# Patient Record
Sex: Female | Born: 1965 | Race: White | Hispanic: No | Smoking: Current every day smoker
Health system: Southern US, Community
[De-identification: ages and names within clinical notes are randomized; demographics above are authoritative.]

## PROBLEM LIST (undated history)

## (undated) DIAGNOSIS — Z720 Tobacco use: Secondary | ICD-10-CM

## (undated) DIAGNOSIS — R251 Tremor, unspecified: Secondary | ICD-10-CM

## (undated) DIAGNOSIS — G629 Polyneuropathy, unspecified: Secondary | ICD-10-CM

## (undated) DIAGNOSIS — J449 Chronic obstructive pulmonary disease, unspecified: Secondary | ICD-10-CM

## (undated) DIAGNOSIS — R51 Headache: Secondary | ICD-10-CM

## (undated) DIAGNOSIS — I10 Essential (primary) hypertension: Secondary | ICD-10-CM

## (undated) DIAGNOSIS — M5136 Other intervertebral disc degeneration, lumbar region: Secondary | ICD-10-CM

## (undated) DIAGNOSIS — M5030 Other cervical disc degeneration, unspecified cervical region: Secondary | ICD-10-CM

## (undated) DIAGNOSIS — J42 Unspecified chronic bronchitis: Secondary | ICD-10-CM

## (undated) DIAGNOSIS — F329 Major depressive disorder, single episode, unspecified: Secondary | ICD-10-CM

## (undated) DIAGNOSIS — E7800 Pure hypercholesterolemia, unspecified: Secondary | ICD-10-CM

## (undated) DIAGNOSIS — Z923 Personal history of irradiation: Secondary | ICD-10-CM

## (undated) DIAGNOSIS — C50919 Malignant neoplasm of unspecified site of unspecified female breast: Secondary | ICD-10-CM

## (undated) DIAGNOSIS — K219 Gastro-esophageal reflux disease without esophagitis: Secondary | ICD-10-CM

## (undated) DIAGNOSIS — M898X9 Other specified disorders of bone, unspecified site: Secondary | ICD-10-CM

## (undated) DIAGNOSIS — F319 Bipolar disorder, unspecified: Secondary | ICD-10-CM

## (undated) DIAGNOSIS — G2581 Restless legs syndrome: Secondary | ICD-10-CM

## (undated) DIAGNOSIS — M51369 Other intervertebral disc degeneration, lumbar region without mention of lumbar back pain or lower extremity pain: Secondary | ICD-10-CM

## (undated) DIAGNOSIS — F32A Depression, unspecified: Secondary | ICD-10-CM

## (undated) DIAGNOSIS — F419 Anxiety disorder, unspecified: Secondary | ICD-10-CM

## (undated) HISTORY — DX: Malignant neoplasm of unspecified site of unspecified female breast: C50.919

## (undated) HISTORY — DX: Bipolar disorder, unspecified: F31.9

## (undated) HISTORY — DX: Other cervical disc degeneration, unspecified cervical region: M50.30

## (undated) HISTORY — DX: Depression, unspecified: F32.A

## (undated) HISTORY — DX: Polyneuropathy, unspecified: G62.9

## (undated) HISTORY — DX: Restless legs syndrome: G25.81

## (undated) HISTORY — DX: Anxiety disorder, unspecified: F41.9

## (undated) HISTORY — DX: Other intervertebral disc degeneration, lumbar region without mention of lumbar back pain or lower extremity pain: M51.369

## (undated) HISTORY — PX: ABDOMINAL HYSTERECTOMY: SHX81

## (undated) HISTORY — PX: ANKLE SURGERY: SHX546

## (undated) HISTORY — DX: Other intervertebral disc degeneration, lumbar region: M51.36

## (undated) HISTORY — DX: Tremor, unspecified: R25.1

---
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
## 1997-10-12 ENCOUNTER — Other Ambulatory Visit: Admission: RE | Admit: 1997-10-12 | Discharge: 1997-10-12 | Payer: Self-pay | Admitting: Gynecology

## 2001-07-26 ENCOUNTER — Emergency Department (HOSPITAL_COMMUNITY): Admission: EM | Admit: 2001-07-26 | Discharge: 2001-07-26 | Payer: Self-pay | Admitting: *Deleted

## 2001-08-06 ENCOUNTER — Emergency Department (HOSPITAL_COMMUNITY): Admission: EM | Admit: 2001-08-06 | Discharge: 2001-08-06 | Payer: Self-pay | Admitting: Emergency Medicine

## 2001-10-20 ENCOUNTER — Emergency Department (HOSPITAL_COMMUNITY): Admission: EM | Admit: 2001-10-20 | Discharge: 2001-10-21 | Payer: Self-pay | Admitting: Emergency Medicine

## 2001-12-11 ENCOUNTER — Emergency Department (HOSPITAL_COMMUNITY): Admission: EM | Admit: 2001-12-11 | Discharge: 2001-12-11 | Payer: Self-pay | Admitting: Internal Medicine

## 2001-12-14 ENCOUNTER — Emergency Department (HOSPITAL_COMMUNITY): Admission: EM | Admit: 2001-12-14 | Discharge: 2001-12-14 | Payer: Self-pay

## 2002-05-18 ENCOUNTER — Emergency Department (HOSPITAL_COMMUNITY): Admission: EM | Admit: 2002-05-18 | Discharge: 2002-05-19 | Payer: Self-pay | Admitting: Emergency Medicine

## 2002-05-21 ENCOUNTER — Emergency Department (HOSPITAL_COMMUNITY): Admission: EM | Admit: 2002-05-21 | Discharge: 2002-05-22 | Payer: Self-pay | Admitting: Emergency Medicine

## 2007-06-05 ENCOUNTER — Emergency Department (HOSPITAL_COMMUNITY): Admission: EM | Admit: 2007-06-05 | Discharge: 2007-06-06 | Payer: Self-pay | Admitting: Emergency Medicine

## 2008-08-27 ENCOUNTER — Emergency Department (HOSPITAL_COMMUNITY): Admission: EM | Admit: 2008-08-27 | Discharge: 2008-08-27 | Payer: Self-pay | Admitting: Emergency Medicine

## 2011-03-12 ENCOUNTER — Emergency Department (HOSPITAL_COMMUNITY): Payer: Self-pay

## 2011-03-12 ENCOUNTER — Inpatient Hospital Stay (HOSPITAL_COMMUNITY)
Admission: EM | Admit: 2011-03-12 | Discharge: 2011-03-14 | DRG: 419 | Disposition: A | Discharge status: Home / Self Care | Payer: Self-pay | Attending: General Surgery | Admitting: General Surgery

## 2011-03-12 ENCOUNTER — Encounter: Payer: Self-pay | Admitting: *Deleted

## 2011-03-12 DIAGNOSIS — K810 Acute cholecystitis: Principal | ICD-10-CM | POA: Diagnosis present

## 2011-03-12 HISTORY — DX: Essential (primary) hypertension: I10

## 2011-03-12 HISTORY — DX: Unspecified chronic bronchitis: J42

## 2011-03-12 HISTORY — DX: Headache: R51

## 2011-03-12 LAB — DIFFERENTIAL
Basophils Absolute: 0 10*3/uL (ref 0.0–0.1)
Basophils Relative: 0 % (ref 0–1)
Lymphocytes Relative: 14 % (ref 12–46)
Monocytes Absolute: 1.2 10*3/uL — ABNORMAL HIGH (ref 0.1–1.0)
Monocytes Relative: 7 % (ref 3–12)
Neutro Abs: 13.4 10*3/uL — ABNORMAL HIGH (ref 1.7–7.7)
Neutrophils Relative %: 79 % — ABNORMAL HIGH (ref 43–77)

## 2011-03-12 LAB — AMYLASE: Amylase: 60 U/L (ref 0–105)

## 2011-03-12 LAB — CBC
HCT: 44.6 % (ref 36.0–46.0)
Hemoglobin: 15.6 g/dL — ABNORMAL HIGH (ref 12.0–15.0)
MCH: 29.9 pg (ref 26.0–34.0)
MCV: 85.6 fL (ref 78.0–100.0)
Platelets: 285 10*3/uL (ref 150–400)
RBC: 5.21 MIL/uL — ABNORMAL HIGH (ref 3.87–5.11)
WBC: 17 10*3/uL — ABNORMAL HIGH (ref 4.0–10.5)

## 2011-03-12 LAB — URINALYSIS, ROUTINE W REFLEX MICROSCOPIC
Bilirubin Urine: NEGATIVE
Nitrite: NEGATIVE
Specific Gravity, Urine: 1.01 (ref 1.005–1.030)
Urobilinogen, UA: 0.2 mg/dL (ref 0.0–1.0)
pH: 6 (ref 5.0–8.0)

## 2011-03-12 LAB — BASIC METABOLIC PANEL
CO2: 24 mEq/L (ref 19–32)
Calcium: 9.7 mg/dL (ref 8.4–10.5)
Chloride: 98 mEq/L (ref 96–112)
Creatinine, Ser: 0.66 mg/dL (ref 0.50–1.10)
Glucose, Bld: 113 mg/dL — ABNORMAL HIGH (ref 70–99)

## 2011-03-12 LAB — RAPID URINE DRUG SCREEN, HOSP PERFORMED
Amphetamines: NOT DETECTED
Benzodiazepines: NOT DETECTED
Cocaine: NOT DETECTED

## 2011-03-12 MED ORDER — SODIUM CHLORIDE 0.9 % IJ SOLN
INTRAMUSCULAR | Status: AC
  Administered 2011-03-12: 16:00:00
  Filled 2011-03-12: qty 10

## 2011-03-12 MED ORDER — ONDANSETRON HCL 4 MG/2ML IJ SOLN
4.0000 mg | Freq: Once | INTRAMUSCULAR | Status: AC
  Administered 2011-03-12: 4 mg via INTRAVENOUS

## 2011-03-12 MED ORDER — SODIUM CHLORIDE 0.9 % IJ SOLN
INTRAMUSCULAR | Status: AC
  Administered 2011-03-12: 10 mL
  Filled 2011-03-12: qty 10

## 2011-03-12 MED ORDER — SODIUM CHLORIDE 0.9 % IV BOLUS (SEPSIS)
500.0000 mL | Freq: Once | INTRAVENOUS | Status: AC
  Administered 2011-03-12: 07:00:00 via INTRAVENOUS

## 2011-03-12 MED ORDER — SODIUM CHLORIDE 0.9 % IV SOLN: Freq: Once | INTRAVENOUS | Status: DC

## 2011-03-12 MED ORDER — METOPROLOL TARTRATE 1 MG/ML IV SOLN
5.0000 mg | Freq: Four times a day (QID) | INTRAVENOUS | Status: DC
  Administered 2011-03-12 – 2011-03-14 (×5): 5 mg via INTRAVENOUS
  Filled 2011-03-12 (×5): qty 5

## 2011-03-12 MED ORDER — LACTATED RINGERS IV SOLN
INTRAVENOUS | Status: DC
  Administered 2011-03-12 – 2011-03-13 (×3): via INTRAVENOUS

## 2011-03-12 MED ORDER — SODIUM CHLORIDE 0.9 % IV SOLN
999.0000 mL | INTRAVENOUS | Status: DC
  Administered 2011-03-12 (×2): 999 mL via INTRAVENOUS

## 2011-03-12 MED ORDER — NICOTINE 21 MG/24HR TD PT24
21.0000 mg | MEDICATED_PATCH | Freq: Every day | TRANSDERMAL | Status: DC
  Administered 2011-03-12 – 2011-03-14 (×3): 21 mg via TRANSDERMAL
  Filled 2011-03-12 (×3): qty 1

## 2011-03-12 MED ORDER — ENOXAPARIN SODIUM 40 MG/0.4ML ~~LOC~~ SOLN
40.0000 mg | Freq: Every day | SUBCUTANEOUS | Status: DC
  Administered 2011-03-12 – 2011-03-14 (×3): 40 mg via SUBCUTANEOUS
  Filled 2011-03-12 (×3): qty 0.4

## 2011-03-12 MED ORDER — ONDANSETRON HCL 4 MG/2ML IJ SOLN
4.0000 mg | Freq: Four times a day (QID) | INTRAMUSCULAR | Status: DC | PRN
  Administered 2011-03-12 – 2011-03-13 (×4): 4 mg via INTRAVENOUS
  Filled 2011-03-12 (×6): qty 2

## 2011-03-12 MED ORDER — ONDANSETRON HCL 4 MG/2ML IJ SOLN
4.0000 mg | Freq: Once | INTRAMUSCULAR | Status: AC
  Administered 2011-03-12: 4 mg via INTRAVENOUS
  Filled 2011-03-12: qty 2

## 2011-03-12 MED ORDER — HYDROMORPHONE HCL 1 MG/ML IJ SOLN: 1.0000 mg | INTRAMUSCULAR | Status: DC | PRN

## 2011-03-12 MED ORDER — HYDROMORPHONE HCL 1 MG/ML IJ SOLN
1.0000 mg | Freq: Once | INTRAMUSCULAR | Status: AC
  Administered 2011-03-12: 1 mg via INTRAVENOUS
  Filled 2011-03-12: qty 1

## 2011-03-12 MED ORDER — SODIUM CHLORIDE 0.9 % IV SOLN: INTRAVENOUS | Status: DC

## 2011-03-12 MED ORDER — ONDANSETRON HCL 4 MG/2ML IJ SOLN: 4.0000 mg | Freq: Three times a day (TID) | INTRAMUSCULAR | Status: DC | PRN

## 2011-03-12 MED ORDER — HYDROMORPHONE HCL 1 MG/ML IJ SOLN
1.0000 mg | INTRAMUSCULAR | Status: DC | PRN
  Administered 2011-03-12 – 2011-03-13 (×4): 1 mg via INTRAVENOUS
  Administered 2011-03-13: 2 mg via INTRAVENOUS
  Filled 2011-03-12: qty 2
  Filled 2011-03-12 (×4): qty 1

## 2011-03-12 MED ORDER — PIPERACILLIN-TAZOBACTAM 3.375 G IVPB
3.3750 g | Freq: Once | INTRAVENOUS | Status: AC
  Administered 2011-03-12: 3.375 g via INTRAVENOUS
  Filled 2011-03-12: qty 50

## 2011-03-12 MED ORDER — SODIUM CHLORIDE 0.9 % IJ SOLN
INTRAMUSCULAR | Status: AC
  Administered 2011-03-12: 16:00:00
  Filled 2011-03-12: qty 3

## 2011-03-12 MED ORDER — PIPERACILLIN-TAZOBACTAM 3.375 G IVPB
3.3750 g | Freq: Three times a day (TID) | INTRAVENOUS | Status: DC
  Administered 2011-03-12 – 2011-03-13 (×3): 3.375 g via INTRAVENOUS
  Filled 2011-03-12 (×7): qty 50

## 2011-03-12 NOTE — ED Provider Notes (Signed)
History     CSN: [id number] Arrival date & time: 03/12/2011  6:21 AM Scribed for Gerhard Munch, MD, the patient was seen in room APA08/APA08. This chart was scribed by Katha Cabal. This patient's care was started at  8:11AM.     Chief Complaint  Patient presents with  . Abdominal Pain   HPI  Elizabeth Bullock is a 45 y.o. female who presents to the Emergency Department complaining of sharp, constant RUQ, RLQ abdominal pain that radiates to right flank.  Pain began all of a sudden yesterday evening with associated nausea and vomiting.  Pt denies headache, chills, fever, diarrhea, vaginal bleeding, SOB, lightheadedness and peripheral edema.  Pain is alleviated by nothing and is aggravated by various factors (none specifically noted by patient).  Pt has hx of headaches and is compliant with prescribed medication.   Pt adds that she had a hysterectomy but still has  her tonsils and appendix.      PAST MEDICAL HISTORY:  Past Medical History  Diagnosis Date  . Hypertension   . Bronchitis, chronic     PAST SURGICAL HISTORY:  Past Surgical History  Procedure Date  . Abdominal hysterectomy     MEDICATIONS:  Previous Medications   AMITRIPTYLINE (ELAVIL) 50 MG TABLET    Take 50 mg by mouth 2 (two) times daily.     IBUPROFEN (ADVIL,MOTRIN) 200 MG TABLET    Take 800 mg by mouth every 6 (six) hours as needed. Pain    MELOXICAM (MOBIC) 15 MG TABLET    Take 15 mg by mouth daily.     PROPRANOLOL (INDERAL) 40 MG TABLET    Take 40 mg by mouth 2 (two) times daily.     SERTRALINE (ZOLOFT) 50 MG TABLET    Take 50 mg by mouth daily.       ALLERGIES:  Allergies as of 03/12/2011 - Review Complete 03/12/2011  Allergen Reaction Noted  . Codeine Hives and Nausea And Vomiting 03/12/2011  . Morphine and related Nausea Only 03/12/2011     FAMILY HISTORY:  History reviewed. No pertinent family history.   SOCIAL HISTORY: History   Social History  . Marital Status: Divorced    Spouse Name: N/A    Number of Children: N/A  . Years of Education: N/A   Social History Main Topics  . Smoking status: Current Everyday Smoker -- 1.0 packs/day    Types: Cigarettes  . Smokeless tobacco: None  . Alcohol Use: No  . Drug Use: No  . Sexually Active:    Other Topics Concern  . None   Social History Narrative  . None    Review of Systems 10 Systems reviewed and are negative for acute change except as noted in the HPI.  Physical Exam  BP 140/103  Pulse 79  Temp(Src) 97.8 F (36.6 C) (Oral)  Resp 20  Ht 5\' 4"  (1.626 m)  Wt 180 lb (81.647 kg)  BMI 30.90 kg/m2  SpO2 99%  Physical Exam  Constitutional: She is oriented to person, place, and time. She appears well-developed and well-nourished. No distress.  HENT:  Head: Normocephalic.  Eyes: Conjunctivae are normal. Pupils are equal, round, and reactive to light.  Neck: Normal range of motion. Neck supple.  Cardiovascular: Normal rate, regular rhythm and normal heart sounds.   No murmur heard. Pulmonary/Chest: Effort normal and breath sounds normal. No respiratory distress. She has no wheezes. She has no rales.  Abdominal: Soft. Bowel sounds are normal. There is tenderness. There is guarding (right )  and CVA tenderness (right ).       RUQ, RLQ tenderness,   Musculoskeletal: Normal range of motion.       Negative test of straight leg test bilaterally.    Neurological: She is alert and oriented to person, place, and time.  Skin: Skin is warm and dry. She is not diaphoretic.  Psychiatric: She has a normal mood and affect. Her behavior is normal.    ED Course  Procedures OTHER DATA REVIEWED: Nursing notes, vital signs, and past medical records reviewed.   DIAGNOSTIC STUDIES: Oxygen Saturation is 99% on room air, normal by my interpretation.     LABS / RADIOLOGY:  Results for orders placed during the hospital encounter of 03/12/11  CBC      Component Value Range   WBC 17.0 (*) 4.0 - 10.5 (K/uL)   RBC 5.21 (*) 3.87 - 5.11  (MIL/uL)   Hemoglobin 15.6 (*) 12.0 - 15.0 (g/dL)   HCT 14.7  82.9 - 56.2 (%)   MCV 85.6  78.0 - 100.0 (fL)   MCH 29.9  26.0 - 34.0 (pg)   MCHC 35.0  30.0 - 36.0 (g/dL)   RDW 13.0  86.5 - 78.4 (%)   Platelets 285  150 - 400 (K/uL)  BASIC METABOLIC PANEL      Component Value Range   Sodium 136  135 - 145 (mEq/L)   Potassium 3.7  3.5 - 5.1 (mEq/L)   Chloride 98  96 - 112 (mEq/L)   CO2 24  19 - 32 (mEq/L)   Glucose, Bld 113 (*) 70 - 99 (mg/dL)   BUN 5 (*) 6 - 23 (mg/dL)   Creatinine, Ser 6.96  0.50 - 1.10 (mg/dL)   Calcium 9.7  8.4 - 29.5 (mg/dL)   GFR calc non Af Amer >60  >60 (mL/min)   GFR calc Af Amer >60  >60 (mL/min)  AMYLASE      Component Value Range   Amylase 60  0 - 105 (U/L)  LIPASE, BLOOD      Component Value Range   Lipase 35  11 - 59 (U/L)  URINALYSIS, ROUTINE W REFLEX MICROSCOPIC      Component Value Range   Color, Urine YELLOW  YELLOW    Appearance CLEAR  CLEAR    Specific Gravity, Urine 1.010  1.005 - 1.030    pH 6.0  5.0 - 8.0    Glucose, UA NEGATIVE  NEGATIVE (mg/dL)   Hgb urine dipstick TRACE (*) NEGATIVE    Bilirubin Urine NEGATIVE  NEGATIVE    Ketones, ur NEGATIVE  NEGATIVE (mg/dL)   Protein, ur NEGATIVE  NEGATIVE (mg/dL)   Urobilinogen, UA 0.2  0.0 - 1.0 (mg/dL)   Nitrite NEGATIVE  NEGATIVE    Leukocytes, UA NEGATIVE  NEGATIVE   DIFFERENTIAL      Component Value Range   Neutrophils Relative 79 (*) 43 - 77 (%)   Neutro Abs 13.4 (*) 1.7 - 7.7 (K/uL)   Lymphocytes Relative 14  12 - 46 (%)   Lymphs Abs 2.3  0.7 - 4.0 (K/uL)   Monocytes Relative 7  3 - 12 (%)   Monocytes Absolute 1.2 (*) 0.1 - 1.0 (K/uL)   Eosinophils Relative 1  0 - 5 (%)   Eosinophils Absolute 0.1  0.0 - 0.7 (K/uL)   Basophils Relative 0  0 - 1 (%)   Basophils Absolute 0.0  0.0 - 0.1 (K/uL)  URINE RAPID DRUG SCREEN (HOSP PERFORMED)      Component  Value Range   Opiates NONE DETECTED  NONE DETECTED    Cocaine NONE DETECTED  NONE DETECTED    Benzodiazepines NONE DETECTED  NONE  DETECTED    Amphetamines NONE DETECTED  NONE DETECTED    Tetrahydrocannabinol POSITIVE (*) NONE DETECTED    Barbiturates NONE DETECTED  NONE DETECTED   URINE MICROSCOPIC-ADD ON      Component Value Range   Squamous Epithelial / LPF FEW (*) RARE    RBC / HPF 0-2  <3 (RBC/hpf)     Ct Abdomen Pelvis Wo Contrast  03/12/2011  *RADIOLOGY REPORT*  Clinical Data: Right flank pain, denies history of stones  CT ABDOMEN AND PELVIS WITHOUT CONTRAST  Technique:  Multidetector CT imaging of the abdomen and pelvis was performed following the standard protocol without intravenous contrast.  Comparison: None.  Findings:  The lack of intravenous contrast on stability to evaluate solid abdominal organs.  Normal hepatic contour.  No discrete fat pelvic lesions on this noncontrast examination.  The gallbladder is distended but otherwise normal without definite evidence of wall thickening or pericholecystic fluid.  No ascites.  There is a punctate (1-2 mm) stone within the right (image 36) and left kidneys (image 37). No evidence of hydronephrosis or ureterectasis.  No stones within the urinary bladder.  No discrete hyper hypoattenuating renal lesions.  There is mild thickening of the bilateral adrenal glands without discrete nodules.  The noncontrast appearance of the pancreas and spleen is normal.  The the bowel is normal in course and caliber without wall thickening or evidence of obstruction. Normal appendix.  No pneumoperitoneum, pneumatosis or portal venous gas.  There is scattered minimal atherosclerotic calcifications within a normal caliber abdominal aorta.  No retroperitoneal, mesenteric, pelvic or inguinal lymphadenopathy.  Limited visualization of the lower thorax demonstrates minimal right basilar subpleural heterogeneous opacities, likely atelectasis.  No focal airspace opacities.  No pleural effusion. Normal heart size.  No pericardial effusion.  Hypoattenuating (8 HU) approximately 3.1 x 1.5 cm left adnexal  lesion may represent am ovarian cyst.  No free fluid within the pelvis.  No discrete right-sided adnexal lesion.  No acute aggressive osseous abnormalities.  Minimal degenerative change of L2 - L3.  IMPRESSION: 1.  Bilateral punctate (1-2 mm) nonobstructing stones without evidence of obstruction.  2.  Hypoattenuating left adnexal lesion may represent an ovarian cyst.  Further evaluation may be performed with pelvic ultrasound as clinically indicated.  Original Report Authenticated By: Waynard Reeds, M.D.   US Abdomen Limited  03/12/2011  *RADIOLOGY REPORT*  Clinical Data:  Right upper quadrant pain  LIMITED ABDOMINAL ULTRASOUND - RIGHT UPPER QUADRANT  Comparison:  03/12/2011 CT  Findings:  Gallbladder:  Numerous echogenic shadowing gallstones noted, greatest diameter 2 cm.  Gallbladder wall appears thickened measuring up to 1.3 cm.  Trace pericholecystic fluid evident with an associated Murphy's sign over the gallbladder during the exam. Findings are compatible with acute calculus cholecystitis.  Common bile duct:  5 mm diameter.  No biliary dilatation or obstruction.  Liver:  No intrahepatic biliary dilatation or focal hepatic abnormality.  Portal vein is patent.  IMPRESSION: Findings compatible with calculus cholecystitis as described.                   Original Report Authenticated By: Judie Petit. Ruel Favors, M.D.    ZO:[id number]:  Gallbladder: Numerous echogenic shadowing gallstones noted, greatest diameter 2 cm. Gallbladder wall appears thickened measuring up to 1.3 cm. Trace pericholecystic fluid evident with an associated Murphy's  sign over the gallbladder during the exam. Findings are compatible with acute calculus cholecystitis.  Common bile duct: 5 mm diameter. No biliary dilatation or obstruction.  Liver: No intrahepatic biliary dilatation or focal hepatic abnormality. Portal vein is patent.      ED COURSE / COORDINATION OF CARE:  Orders Placed This Encounter  Procedures  . CT  Abdomen Pelvis Wo Contrast  . US Abdomen Limited  . CBC  . Basic metabolic panel  . Amylase  . Lipase, blood  . Urinalysis with microscopic  . Differential  . Urine rapid drug screen (hosp performed)  . Urine microscopic-add on  . DIET NPO  . Consult to general surgery  . Bed Request ED to IP    MDM:  45 year old patient presenting with right-sided abdominal pain. The description of the pain is severe, about the right flank and right side of the abdomen, with persistent nausea was concerning for acute abdominal pathology. Initial labs demonstrating a white count of 18,000, and ultrasound with gallbladder wall thickening is consistent with acute cholecystitis. The patient has received IV fluids, Zosyn, and analgesia in the ED. Surgery has been consult in to assist with further management of this patient.    MEDICATIONS GIVEN IN THE E.D. Scheduled Meds:    . sodium chloride   Intravenous STAT  . HYDROmorphone  1 mg Intravenous Once  . ondansetron  4 mg Intravenous Once  . ondansetron  4 mg Intravenous Once  . sodium chloride  500 mL Intravenous Once  . DISCONTD: sodium chloride   Intravenous Once   Continuous Infusions:    . sodium chloride 999 mL (03/12/11 0845)  . piperacillin-tazobactam 3.375 g (03/12/11 1022)      DISCHARGE MEDICATIONS: New Prescriptions   No medications on file     I have seen and evaluated the patient with the assistance of a scribe.  All the documentation has been reviewed, corrected, edited to appropriately reflect my evaluation.Gerhard Munch, MD 03/26/11 [phone]

## 2011-03-12 NOTE — H&P (Signed)
Elizabeth Bullock is an 45 y.o. female.   Chief Complaint: Right upper quadrant abdominal pain. HPI: Patient is a 45 year old he'll presented to the emergency department with less than 24 hours of right upper quadrant abdominal pain. It started relatively acutely. It has been localized to the right upper quadrant with radiation to the right flank and back. It started with him an hour after. She did have fatty greasy foods for dinner last night prior to the episode. She denies any similar symptomatology.  She has had associated nausea. She has had nonbloody emesis. She is in no change in bowel movements with no melena no hematochezia. She denies any fevers or chills. She denies any jaundice. She does have significant family history of biliary disease. She does have been American ancestry. She is at 1 prior pregnancy. Past Medical History  Diagnosis Date  . Hypertension   . Bronchitis, chronic     Past Surgical History  Procedure Date  . Abdominal hysterectomy   . Ankle surgery     left ankle surgery as teenager per pt    History reviewed. No pertinent family history. Social History:  reports that she has been smoking Cigarettes.  She has been smoking about 1 pack per day. She does not have any smokeless tobacco history on file. She reports that she uses illicit drugs (Marijuana and Other-see comments). She reports that she does not drink alcohol.  Allergies:  Allergies  Allergen Reactions  . Codeine Hives and Nausea And Vomiting  . Morphine And Related Nausea Only    Medications Prior to Admission  Medication Dose Route Frequency Provider Last Rate Last Dose  . 0.9 %  sodium chloride infusion  999 mL Intravenous Continuous Gerhard Munch, MD   999 mL at 03/12/11 0845  . 0.9 %  sodium chloride infusion   Intravenous STAT Gerhard Munch, MD      . enoxaparin (LOVENOX) injection 40 mg  40 mg Subcutaneous Q24H Fabio Bering      . HYDROmorphone (DILAUDID) injection 1 mg  1 mg Intravenous  Once Gerhard Munch, MD   1 mg at 03/12/11 0844  . HYDROmorphone (DILAUDID) injection 1-2 mg  1-2 mg Intravenous Q4H PRN Fabio Bering      . lactated ringers infusion   Intravenous Continuous Fabio Bering      . ondansetron (ZOFRAN) injection 4 mg  4 mg Intravenous Once [employer]. Colon Branch, MD   4 mg at 03/12/11 0658  . ondansetron (ZOFRAN) injection 4 mg  4 mg Intravenous Once Gerhard Munch, MD   4 mg at 03/12/11 0844  . ondansetron (ZOFRAN) injection 4 mg  4 mg Intravenous Q8H PRN Gerhard Munch, MD      . ondansetron [facility]) injection 4 mg  4 mg Intravenous Q6H PRN Fabio Bering      . piperacillin-tazobactam (ZOSYN) IVPB 3.375 g  3.375 g Intravenous Once Gerhard Munch, MD   3.375 g at 03/12/11 1022  . piperacillin-tazobactam (ZOSYN) IVPB 3.375 g  3.375 g Intravenous Q8H Fabio Bering      . sodium chloride 0.9 % bolus 500 mL  500 mL Intravenous Once [employer]. Colon Branch, MD      . DISCONTD: 0.9 %  sodium chloride infusion   Intravenous Once Nicoletta Dress. Colon Branch, MD      . DISCONTD: HYDROmorphone (DILAUDID) injection 1 mg  1 mg Intravenous Q4H PRN Gerhard Munch, MD       No current outpatient prescriptions on file as  of 03/12/2011.    Results for orders placed during the hospital encounter of 03/12/11 (from the past 48 hour(s))  CBC     Status: Abnormal   Collection Time   03/12/11  6:54 AM      Component Value Range Comment   WBC 17.0 (*) 4.0 - 10.5 (K/uL)    RBC 5.21 (*) 3.87 - 5.11 (MIL/uL)    Hemoglobin 15.6 (*) 12.0 - 15.0 (g/dL)    HCT 16.1  09.6 - 04.5 (%)    MCV 85.6  78.0 - 100.0 (fL)    MCH 29.9  26.0 - 34.0 (pg)    MCHC 35.0  30.0 - 36.0 (g/dL)    RDW 40.9  81.1 - 91.4 (%)    Platelets 285  150 - 400 (K/uL)   BASIC METABOLIC PANEL     Status: Abnormal   Collection Time   03/12/11  6:54 AM      Component Value Range Comment   Sodium 136  135 - 145 (mEq/L)    Potassium 3.7  3.5 - 5.1 (mEq/L)    Chloride 98  96 - 112 (mEq/L)    CO2 24  19 - 32 (mEq/L)    Glucose,  Bld 113 (*) 70 - 99 (mg/dL)    BUN 5 (*) 6 - 23 (mg/dL)    Creatinine, Ser 7.82  0.50 - 1.10 (mg/dL)    Calcium 9.7  8.4 - 10.5 (mg/dL)    GFR calc non Af Amer >60  >60 (mL/min)    GFR calc Af Amer >60  >60 (mL/min)   AMYLASE     Status: Normal   Collection Time   03/12/11  6:54 AM      Component Value Range Comment   Amylase 60  0 - 105 (U/L)   LIPASE, BLOOD     Status: Normal   Collection Time   03/12/11  6:54 AM      Component Value Range Comment   Lipase 35  11 - 59 (U/L)   DIFFERENTIAL     Status: Abnormal   Collection Time   03/12/11  6:54 AM      Component Value Range Comment   Neutrophils Relative 79 (*) 43 - 77 (%)    Neutro Abs 13.4 (*) 1.7 - 7.7 (K/uL)    Lymphocytes Relative 14  12 - 46 (%)    Lymphs Abs 2.3  0.7 - 4.0 (K/uL)    Monocytes Relative 7  3 - 12 (%)    Monocytes Absolute 1.2 (*) 0.1 - 1.0 (K/uL)    Eosinophils Relative 1  0 - 5 (%)    Eosinophils Absolute 0.1  0.0 - 0.7 (K/uL)    Basophils Relative 0  0 - 1 (%)    Basophils Absolute 0.0  0.0 - 0.1 (K/uL)   URINALYSIS, ROUTINE W REFLEX MICROSCOPIC     Status: Abnormal   Collection Time   03/12/11  7:41 AM      Component Value Range Comment   Color, Urine YELLOW  YELLOW     Appearance CLEAR  CLEAR     Specific Gravity, Urine 1.010  1.005 - 1.030     pH 6.0  5.0 - 8.0     Glucose, UA NEGATIVE  NEGATIVE (mg/dL)    Hgb urine dipstick TRACE (*) NEGATIVE     Bilirubin Urine NEGATIVE  NEGATIVE     Ketones, ur NEGATIVE  NEGATIVE (mg/dL)    Protein, ur NEGATIVE  NEGATIVE (mg/dL)  Urobilinogen, UA 0.2  0.0 - 1.0 (mg/dL)    Nitrite NEGATIVE  NEGATIVE     Leukocytes, UA NEGATIVE  NEGATIVE    URINE RAPID DRUG SCREEN (HOSP PERFORMED)     Status: Abnormal   Collection Time   03/12/11  7:41 AM      Component Value Range Comment   Opiates NONE DETECTED  NONE DETECTED     Cocaine NONE DETECTED  NONE DETECTED     Benzodiazepines NONE DETECTED  NONE DETECTED     Amphetamines NONE DETECTED  NONE DETECTED      Tetrahydrocannabinol POSITIVE (*) NONE DETECTED     Barbiturates NONE DETECTED  NONE DETECTED    URINE MICROSCOPIC-ADD ON     Status: Abnormal   Collection Time   03/12/11  7:41 AM      Component Value Range Comment   Squamous Epithelial / LPF FEW (*) RARE     RBC / HPF 0-2  <3 (RBC/hpf)    Ct Abdomen Pelvis Wo Contrast  03/12/2011  *RADIOLOGY REPORT*  Clinical Data: Right flank pain, denies history of stones  CT ABDOMEN AND PELVIS WITHOUT CONTRAST  Technique:  Multidetector CT imaging of the abdomen and pelvis was performed following the standard protocol without intravenous contrast.  Comparison: None.  Findings:  The lack of intravenous contrast on stability to evaluate solid abdominal organs.  Normal hepatic contour.  No discrete fat pelvic lesions on this noncontrast examination.  The gallbladder is distended but otherwise normal without definite evidence of wall thickening or pericholecystic fluid.  No ascites.  There is a punctate (1-2 mm) stone within the right (image 36) and left kidneys (image 37). No evidence of hydronephrosis or ureterectasis.  No stones within the urinary bladder.  No discrete hyper hypoattenuating renal lesions.  There is mild thickening of the bilateral adrenal glands without discrete nodules.  The noncontrast appearance of the pancreas and spleen is normal.  The the bowel is normal in course and caliber without wall thickening or evidence of obstruction. Normal appendix.  No pneumoperitoneum, pneumatosis or portal venous gas.  There is scattered minimal atherosclerotic calcifications within a normal caliber abdominal aorta.  No retroperitoneal, mesenteric, pelvic or inguinal lymphadenopathy.  Limited visualization of the lower thorax demonstrates minimal right basilar subpleural heterogeneous opacities, likely atelectasis.  No focal airspace opacities.  No pleural effusion. Normal heart size.  No pericardial effusion.  Hypoattenuating (8 HU) approximately 3.1 x 1.5 cm left  adnexal lesion may represent am ovarian cyst.  No free fluid within the pelvis.  No discrete right-sided adnexal lesion.  No acute aggressive osseous abnormalities.  Minimal degenerative change of L2 - L3.  IMPRESSION: 1.  Bilateral punctate (1-2 mm) nonobstructing stones without evidence of obstruction.  2.  Hypoattenuating left adnexal lesion may represent an ovarian cyst.  Further evaluation may be performed with pelvic ultrasound as clinically indicated.  Original Report Authenticated By: Waynard Reeds, M.D.   US Abdomen Limited  03/12/2011  *RADIOLOGY REPORT*  Clinical Data:  Right upper quadrant pain  LIMITED ABDOMINAL ULTRASOUND - RIGHT UPPER QUADRANT  Comparison:  03/12/2011 CT  Findings:  Gallbladder:  Numerous echogenic shadowing gallstones noted, greatest diameter 2 cm.  Gallbladder wall appears thickened measuring up to 1.3 cm.  Trace pericholecystic fluid evident with an associated Murphy's sign over the gallbladder during the exam. Findings are compatible with acute calculus cholecystitis.  Common bile duct:  5 mm diameter.  No biliary dilatation or obstruction.  Liver:  No  intrahepatic biliary dilatation or focal hepatic abnormality.  Portal vein is patent.  IMPRESSION: Findings compatible with calculus cholecystitis as described.                   Original Report Authenticated By: Judie Petit. Ruel Favors, M.D.    Review of Systems  Constitutional: Positive for fever and chills.  HENT: Negative.   Eyes: Negative.   Respiratory: Negative.   Cardiovascular: Negative.   Gastrointestinal: Positive for heartburn, nausea, vomiting and abdominal pain (right upper quadrant). Negative for blood in stool and melena.  Genitourinary: Negative.   Musculoskeletal: Negative.   Skin: Negative.   Neurological: Negative.   Endo/Heme/Allergies: Negative.   Psychiatric/Behavioral: Negative.     Blood pressure 158/95, pulse 77, temperature 97.4 F (36.3 C), temperature source Oral, resp. rate 20, height 5'  4" (1.626 m), weight 79.4 kg (175 lb 0.7 oz), SpO2 95.00%. Physical Exam  Constitutional: She is oriented to person, place, and time. She appears well-developed and well-nourished.       Not comfortable in appearance  HENT:  Head: Normocephalic and atraumatic.  Eyes: Conjunctivae and EOM are normal. Pupils are equal, round, and reactive to light. No scleral icterus.  Neck: Normal range of motion. Neck supple. No tracheal deviation present. No thyromegaly present.  Cardiovascular: Normal rate, regular rhythm and normal heart sounds.   Respiratory: Effort normal and breath sounds normal. No respiratory distress. She has no wheezes.  GI: Soft. She exhibits no distension and no mass. There is tenderness (moderate-to-severe right upper quadrant abdominal pain. Positive Murphy's sign. No diffuse peritoneal signs.). There is no rebound and no guarding.  Musculoskeletal: She exhibits no edema and no tenderness.  Lymphadenopathy:    She has no cervical adenopathy.  Neurological: She is alert and oriented to person, place, and time.  Skin: Skin is warm and dry.     Assessment/Plan  acute cholecystitis. At this point we'll continue bowel rest. Continue IV fluid hydration and resuscitate patient. Continue IV antibiotics DVT prophylaxis and pain control.  Risks, benefits, and alternatives to surgical intervention were discussed at length with the patient. We'll continue conservative management for now but plan to proceed to the operating room in the next 24-48 hours once the patient is resuscitated. She understands and agrees with plan. Zeph Riebel C 03/12/2011, 12:54 PM

## 2011-03-12 NOTE — ED Notes (Signed)
Pt resting quietly with father at her side.  Father of said pt is "annoyed at length of time for surgeon to come see his daughter".  Father of pt is tearful and anxious of pt's outcome. Reassured family pt was in good hands and surgeon would be in as soon as possible.

## 2011-03-12 NOTE — ED Notes (Signed)
Reports pain in right upper quadrant, radiating to back.  Reports nausea and vomiting as well.  Began apx 12 hours ago.  Took ibuprofen about 7pm.

## 2011-03-13 ENCOUNTER — Encounter (HOSPITAL_COMMUNITY)
Admission: EM | Disposition: A | Discharge status: Home / Self Care | Payer: Self-pay | Source: Home / Self Care | Attending: General Surgery

## 2011-03-13 ENCOUNTER — Encounter (HOSPITAL_COMMUNITY): Payer: Self-pay | Admitting: *Deleted

## 2011-03-13 ENCOUNTER — Other Ambulatory Visit: Payer: Self-pay | Admitting: General Surgery

## 2011-03-13 ENCOUNTER — Encounter (HOSPITAL_COMMUNITY): Payer: Self-pay | Admitting: Anesthesiology

## 2011-03-13 ENCOUNTER — Inpatient Hospital Stay (HOSPITAL_COMMUNITY): Payer: Self-pay | Admitting: Anesthesiology

## 2011-03-13 HISTORY — PX: CHOLECYSTECTOMY: SHX55

## 2011-03-13 LAB — CBC
HCT: 40.2 % (ref 36.0–46.0)
Hemoglobin: 13.6 g/dL (ref 12.0–15.0)
MCH: 29.3 pg (ref 26.0–34.0)
MCHC: 33.8 g/dL (ref 30.0–36.0)
MCV: 86.6 fL (ref 78.0–100.0)
Platelets: 225 10*3/uL (ref 150–400)
RBC: 4.64 MIL/uL (ref 3.87–5.11)
RDW: 12.9 % (ref 11.5–15.5)
WBC: 11.3 10*3/uL — ABNORMAL HIGH (ref 4.0–10.5)

## 2011-03-13 LAB — COMPREHENSIVE METABOLIC PANEL
ALT: 62 U/L — ABNORMAL HIGH (ref 0–35)
AST: 47 U/L — ABNORMAL HIGH (ref 0–37)
Albumin: 3.1 g/dL — ABNORMAL LOW (ref 3.5–5.2)
Alkaline Phosphatase: 135 U/L — ABNORMAL HIGH (ref 39–117)
BUN: 7 mg/dL (ref 6–23)
CO2: 29 mEq/L (ref 19–32)
Calcium: 9.3 mg/dL (ref 8.4–10.5)
Chloride: 97 mEq/L (ref 96–112)
Creatinine, Ser: 0.63 mg/dL (ref 0.50–1.10)
GFR calc Af Amer: >60 mL/min (ref >60)
GFR calc non Af Amer: >60 mL/min (ref >60)
Glucose, Bld: 116 mg/dL — ABNORMAL HIGH (ref 70–99)
Potassium: 3.8 mEq/L (ref 3.5–5.1)
Sodium: 135 mEq/L (ref 135–145)
Total Bilirubin: 0.6 mg/dL (ref 0.3–1.2)
Total Protein: 5.9 g/dL — ABNORMAL LOW (ref 6.0–8.3)

## 2011-03-13 SURGERY — LAPAROSCOPIC CHOLECYSTECTOMY
Anesthesia: General | Site: Abdomen | Wound class: Clean Contaminated

## 2011-03-13 MED ORDER — ROCURONIUM BROMIDE 100 MG/10ML IV SOLN
INTRAVENOUS | Status: DC | PRN
  Administered 2011-03-13: 25 mg via INTRAVENOUS

## 2011-03-13 MED ORDER — FENTANYL CITRATE 0.05 MG/ML IJ SOLN: 25.0000 ug | INTRAMUSCULAR | Status: DC | PRN

## 2011-03-13 MED ORDER — FENTANYL CITRATE 0.05 MG/ML IJ SOLN
INTRAMUSCULAR | Status: AC
  Filled 2011-03-13: qty 5

## 2011-03-13 MED ORDER — MIDAZOLAM HCL 2 MG/2ML IJ SOLN
INTRAMUSCULAR | Status: AC
  Filled 2011-03-13: qty 2

## 2011-03-13 MED ORDER — CELECOXIB 200 MG PO CAPS
200.0000 mg | ORAL_CAPSULE | Freq: Two times a day (BID) | ORAL | Status: DC
  Administered 2011-03-13 – 2011-03-14 (×3): 200 mg via ORAL
  Filled 2011-03-13 (×9): qty 2

## 2011-03-13 MED ORDER — CEFAZOLIN SODIUM 1-5 GM-% IV SOLN
INTRAVENOUS | Status: DC | PRN
  Administered 2011-03-13: 1 g via INTRAVENOUS

## 2011-03-13 MED ORDER — BUPIVACAINE HCL (PF) 0.5 % IJ SOLN
INTRAMUSCULAR | Status: AC
  Filled 2011-03-13: qty 30

## 2011-03-13 MED ORDER — CEFAZOLIN SODIUM 1-5 GM-% IV SOLN
INTRAVENOUS | Status: AC
  Filled 2011-03-13: qty 50

## 2011-03-13 MED ORDER — MIDAZOLAM HCL 2 MG/2ML IJ SOLN
1.0000 mg | INTRAMUSCULAR | Status: DC | PRN
  Administered 2011-03-13: 2 mg via INTRAVENOUS

## 2011-03-13 MED ORDER — CELECOXIB 200 MG PO CAPS
200.0000 mg | ORAL_CAPSULE | Freq: Two times a day (BID) | ORAL | Status: DC
  Filled 2011-03-13 (×3): qty 1

## 2011-03-13 MED ORDER — BUPIVACAINE HCL (PF) 0.5 % IJ SOLN
INTRAMUSCULAR | Status: DC | PRN
  Administered 2011-03-13: 10 mL

## 2011-03-13 MED ORDER — SODIUM CHLORIDE 0.9 % IJ SOLN
INTRAMUSCULAR | Status: AC
  Administered 2011-03-13: 10 mL
  Filled 2011-03-13: qty 10

## 2011-03-13 MED ORDER — ROCURONIUM BROMIDE 50 MG/5ML IV SOLN
INTRAVENOUS | Status: AC
  Filled 2011-03-13: qty 1

## 2011-03-13 MED ORDER — PROPOFOL 10 MG/ML IV EMUL
INTRAVENOUS | Status: AC
  Filled 2011-03-13: qty 20

## 2011-03-13 MED ORDER — FENTANYL CITRATE 0.05 MG/ML IJ SOLN
INTRAMUSCULAR | Status: DC | PRN
  Administered 2011-03-13 (×2): 50 ug via INTRAVENOUS
  Administered 2011-03-13: 100 ug via INTRAVENOUS
  Administered 2011-03-13: 150 ug via INTRAVENOUS

## 2011-03-13 MED ORDER — FENTANYL CITRATE 0.05 MG/ML IJ SOLN
INTRAMUSCULAR | Status: AC
  Filled 2011-03-13: qty 2

## 2011-03-13 MED ORDER — ONDANSETRON HCL 4 MG/2ML IJ SOLN
INTRAMUSCULAR | Status: AC
  Filled 2011-03-13: qty 2

## 2011-03-13 MED ORDER — HYDROCODONE-ACETAMINOPHEN 5-325 MG PO TABS
1.0000 | ORAL_TABLET | ORAL | Status: DC | PRN
  Administered 2011-03-13: 2 via ORAL
  Administered 2011-03-14: 1 via ORAL
  Filled 2011-03-13: qty 2
  Filled 2011-03-13: qty 1

## 2011-03-13 MED ORDER — AMITRIPTYLINE HCL 25 MG PO TABS
50.0000 mg | ORAL_TABLET | Freq: Two times a day (BID) | ORAL | Status: DC
  Administered 2011-03-14: 50 mg via ORAL
  Filled 2011-03-13: qty 2

## 2011-03-13 MED ORDER — LIDOCAINE HCL (PF) 1 % IJ SOLN
INTRAMUSCULAR | Status: AC
  Filled 2011-03-13: qty 5

## 2011-03-13 MED ORDER — SODIUM CHLORIDE 0.9 % IJ SOLN
INTRAMUSCULAR | Status: AC
  Filled 2011-03-13: qty 10

## 2011-03-13 MED ORDER — GLYCOPYRROLATE 0.2 MG/ML IJ SOLN
INTRAMUSCULAR | Status: AC
  Filled 2011-03-13: qty 1

## 2011-03-13 MED ORDER — ONDANSETRON HCL 4 MG/2ML IJ SOLN: 4.0000 mg | Freq: Once | INTRAMUSCULAR | Status: AC | PRN

## 2011-03-13 MED ORDER — MIDAZOLAM HCL 5 MG/5ML IJ SOLN
INTRAMUSCULAR | Status: DC | PRN
  Administered 2011-03-13: 2 mg via INTRAVENOUS

## 2011-03-13 MED ORDER — HEMOSTATIC AGENTS (NO CHARGE) OPTIME
TOPICAL | Status: DC | PRN
  Administered 2011-03-13: 1 via TOPICAL

## 2011-03-13 MED ORDER — LABETALOL HCL 5 MG/ML IV SOLN
INTRAVENOUS | Status: AC
  Filled 2011-03-13: qty 4

## 2011-03-13 MED ORDER — GLYCOPYRROLATE 0.2 MG/ML IJ SOLN
0.2000 mg | Freq: Once | INTRAMUSCULAR | Status: AC
  Administered 2011-03-13: 0.2 mg via INTRAVENOUS

## 2011-03-13 MED ORDER — GLYCOPYRROLATE 0.2 MG/ML IJ SOLN
INTRAMUSCULAR | Status: DC | PRN
  Administered 2011-03-13: .4 mg via INTRAVENOUS

## 2011-03-13 MED ORDER — LIDOCAINE HCL 1 % IJ SOLN
INTRAMUSCULAR | Status: DC | PRN
  Administered 2011-03-13: 50 mg via INTRADERMAL

## 2011-03-13 MED ORDER — SERTRALINE HCL 50 MG PO TABS
50.0000 mg | ORAL_TABLET | Freq: Every day | ORAL | Status: DC
  Administered 2011-03-13 – 2011-03-14 (×2): 50 mg via ORAL
  Filled 2011-03-13 (×2): qty 1

## 2011-03-13 MED ORDER — SUCCINYLCHOLINE CHLORIDE 20 MG/ML IJ SOLN
INTRAMUSCULAR | Status: DC | PRN
  Administered 2011-03-13: 140 mg via INTRAVENOUS

## 2011-03-13 MED ORDER — PROMETHAZINE HCL 25 MG/ML IJ SOLN
25.0000 mg | INTRAMUSCULAR | Status: DC | PRN
  Administered 2011-03-13 (×2): 25 mg via INTRAVENOUS
  Filled 2011-03-13 (×2): qty 1

## 2011-03-13 MED ORDER — ONDANSETRON HCL 4 MG/2ML IJ SOLN
4.0000 mg | Freq: Once | INTRAMUSCULAR | Status: AC
  Administered 2011-03-13: 4 mg via INTRAVENOUS

## 2011-03-13 MED ORDER — LACTATED RINGERS IV SOLN
INTRAVENOUS | Status: DC
  Administered 2011-03-13: 150 mL via INTRAVENOUS
  Administered 2011-03-13 (×2): via INTRAVENOUS

## 2011-03-13 MED ORDER — SODIUM CHLORIDE 0.9 % IR SOLN
Status: DC | PRN
  Administered 2011-03-13: 1000 mL
  Administered 2011-03-13: 3000 mL

## 2011-03-13 MED ORDER — LABETALOL HCL 5 MG/ML IV SOLN
INTRAVENOUS | Status: DC | PRN
  Administered 2011-03-13 (×2): 5 mg via INTRAVENOUS

## 2011-03-13 MED ORDER — SODIUM CHLORIDE 0.9 % IV SOLN
INTRAVENOUS | Status: AC
  Administered 2011-03-13: 75 mL via INTRAVENOUS

## 2011-03-13 MED ORDER — PROPOFOL 10 MG/ML IV EMUL
INTRAVENOUS | Status: DC | PRN
  Administered 2011-03-13: 160 mg via INTRAVENOUS

## 2011-03-13 MED ORDER — NEOSTIGMINE METHYLSULFATE 1 MG/ML IJ SOLN
INTRAMUSCULAR | Status: DC | PRN
  Administered 2011-03-13: 3 mg via INTRAMUSCULAR

## 2011-03-13 MED ORDER — CEFAZOLIN SODIUM 1-5 GM-% IV SOLN
1.0000 g | INTRAVENOUS | Status: DC
  Filled 2011-03-13: qty 50

## 2011-03-13 SURGICAL SUPPLY — 37 items
APPLIER CLIP UNV 5X34 EPIX (ENDOMECHANICALS) ×2 IMPLANT
BAG HAMPER (MISCELLANEOUS) ×2 IMPLANT
BENZOIN TINCTURE PRP APPL 2/3 (GAUZE/BANDAGES/DRESSINGS) ×2 IMPLANT
CLOSURE STERI STRIP 1/2 X4 (GAUZE/BANDAGES/DRESSINGS) ×2 IMPLANT
CLOTH BEACON ORANGE TIMEOUT ST (SAFETY) ×2 IMPLANT
COVER LIGHT HANDLE STERIS (MISCELLANEOUS) ×4 IMPLANT
DECANTER SPIKE VIAL GLASS SM (MISCELLANEOUS) ×2 IMPLANT
DEVICE TROCAR PUNCTURE CLOSURE (ENDOMECHANICALS) ×2 IMPLANT
DURAPREP 26ML APPLICATOR (WOUND CARE) ×2 IMPLANT
ELECT REM PT RETURN 9FT ADLT (ELECTROSURGICAL) ×2
ELECTRODE REM PT RTRN 9FT ADLT (ELECTROSURGICAL) ×1 IMPLANT
FILTER SMOKE EVAC LAPAROSHD (FILTER) ×2 IMPLANT
FORMALIN 10 PREFIL 120ML (MISCELLANEOUS) ×2 IMPLANT
GLOVE BIOGEL PI IND STRL 7.5 (GLOVE) ×1 IMPLANT
GLOVE BIOGEL PI INDICATOR 7.5 (GLOVE) ×1
GLOVE ECLIPSE 7.0 STRL STRAW (GLOVE) ×4 IMPLANT
GLOVE INDICATOR 7.0 STRL GRN (GLOVE) ×4 IMPLANT
GLOVE SS BIOGEL STRL SZ 6.5 (GLOVE) ×2 IMPLANT
GLOVE SUPERSENSE BIOGEL SZ 6.5 (GLOVE) ×2
GOWN BRE IMP SLV AUR XL STRL (GOWN DISPOSABLE) ×6 IMPLANT
HEMOSTAT SNOW SURGICEL 2X4 (HEMOSTASIS) ×2 IMPLANT
INST SET LAPROSCOPIC AP (KITS) ×2 IMPLANT
IV NS IRRIG 3000ML ARTHROMATIC (IV SOLUTION) ×4 IMPLANT
KIT ROOM TURNOVER APOR (KITS) ×2 IMPLANT
KIT TROCAR LAP CHOLE (TROCAR) ×2 IMPLANT
MANIFOLD NEPTUNE II (INSTRUMENTS) ×2 IMPLANT
PACK LAP CHOLE LZT030E (CUSTOM PROCEDURE TRAY) ×2 IMPLANT
PAD ARMBOARD 7.5X6 YLW CONV (MISCELLANEOUS) ×2 IMPLANT
POUCH SPECIMEN RETRIEVAL 10MM (ENDOMECHANICALS) ×2 IMPLANT
SET BASIN LINEN APH (SET/KITS/TRAYS/PACK) ×2 IMPLANT
SET TUBE IRRIG SUCTION NO TIP (IRRIGATION / IRRIGATOR) ×2 IMPLANT
SLEEVE Z-THREAD 5X100MM (TROCAR) ×2 IMPLANT
STRIP CLOSURE SKIN 1/2X4 (GAUZE/BANDAGES/DRESSINGS) ×2 IMPLANT
SUT MNCRL AB 4-0 PS2 18 (SUTURE) ×4 IMPLANT
SUT VIC AB 2-0 CT2 27 (SUTURE) ×4 IMPLANT
SYR 50ML LL SCALE MARK (SYRINGE) ×2 IMPLANT
WARMER LAPAROSCOPE (MISCELLANEOUS) ×2 IMPLANT

## 2011-03-13 NOTE — Anesthesia Postprocedure Evaluation (Signed)
  Anesthesia Post-op Note  Patient: Elizabeth Bullock  Procedure(s) Performed:  LAPAROSCOPIC CHOLECYSTECTOMY  Patient Location: PACU  Anesthesia Type: General  Level of Consciousness: awake, oriented and patient cooperative  Airway and Oxygen Therapy: Patient Spontanous Breathing and Patient connected to face mask oxygen  Post-op Pain: 8 /10, moderate  Post-op Assessment: Post-op Vital signs reviewed, Patient's Cardiovascular Status Stable, Respiratory Function Stable, Patent Airway and No signs of Nausea or vomiting  Post-op Vital Signs: Reviewed and stable  Complications: No apparent anesthesia complications

## 2011-03-13 NOTE — Anesthesia Preprocedure Evaluation (Addendum)
Anesthesia Evaluation  Name, MR# and DOB Patient awake  General Assessment Comment  Reviewed: Allergy & Precautions, H&P , NPO status , Patient's Chart, lab work & pertinent test results, reviewed documented beta blocker date and time   History of Anesthesia Complications Negative for: history of anesthetic complications  Airway Mallampati: I  Neck ROM: Full    Dental  (+) Edentulous Upper, Partial Lower, Poor Dentition, Chipped, Missing and Dental Advisory Given   Pulmonary  COPD (smoker 1 PPD)    pulmonary exam normalPulmonary Exam Normal     Cardiovascular hypertensionRegular Normal    Neuro/Psych   Headaches (takes inderal for migraines, not for BP)  (+) Depression,    GI/Hepatic/Renal   Endo/Other    Abdominal   Musculoskeletal   Hematology   Peds  Reproductive/Obstetrics    Anesthesia Other Findings             Anesthesia Physical Anesthesia Plan  ASA: II  Anesthesia Plan: General   Post-op Pain Management:    Induction: Intravenous, Rapid sequence and Cricoid pressure planned  Airway Management Planned: Oral ETT  Additional Equipment:   Intra-op Plan:   Post-operative Plan: Extubation in OR  Informed Consent: I have reviewed the patients History and Physical, chart, labs and discussed the procedure including the risks, benefits and alternatives for the proposed anesthesia with the patient or authorized representative who has indicated his/her understanding and acceptance.   Dental advisory given  Plan Discussed with:   Anesthesia Plan Comments:         Anesthesia Quick Evaluation

## 2011-03-13 NOTE — Transfer of Care (Signed)
Immediate Anesthesia Transfer of Care Note  Patient: Elizabeth Bullock  Procedure(s) Performed:  LAPAROSCOPIC CHOLECYSTECTOMY  Patient Location: PACU  Anesthesia Type: General  Level of Consciousness: awake and patient cooperative  Airway & Oxygen Therapy: Patient Spontanous Breathing and Patient connected to face mask oxygen  Post-op Assessment: Report given to PACU RN, Post -op Vital signs reviewed and stable and Patient moving all extremities  Post vital signs: Reviewed and stable  Complications: No apparent anesthesia complications

## 2011-03-13 NOTE — Progress Notes (Signed)
Day of Surgery  Subjective: Pain improved since yesterday but still present. No nausea or vomiting. She is having some difficulty voiding with some hesitancy and interrupted stream. She does feel that she is not completely emptying when she voids. This is been a new problem she has not experienced in the past.  Objective: Vital signs in last 24 hours: Temp:  [97.4 F (36.3 C)-98.2 F (36.8 C)] 97.9 F (36.6 C) (09/13 1120) Pulse Rate:  [77-95] 95  (09/13 1120) Resp:  [18-20] 20  (09/13 1120) BP: (105-158)/(71-95) 113/80 mmHg (09/13 1120) SpO2:  [92 %-98 %] 98 % (09/13 1120) Weight:  [79.4 kg (175 lb 0.7 oz)] 175 lb 0.7 oz (79.4 kg) (09/12 1228) Last BM Date: 03/11/11  Intake/Output from previous day: 09/12 0701 - 09/13 0700 In: 1649.6 [I.V.:1598.6; IV Piggyback:50] Out: 1 [Emesis/NG output:1] Intake/Output this shift:    General appearance: alert and no distress Resp: clear to auscultation bilaterally Cardio: regular rate and rhythm GI: Soft, moderately obese, moderate to severe right upper quadrant abdominal pain with continued Murphy's sign. No diffuse peritoneal signs. No masses or hernias are apparent.  Lab Results:  @LABLAST2 (wbc:2,hgb:2,hct:2,plt:2) BMET  Basename 03/13/11 0428 03/12/11 0654  NA 135 136  K 3.8 3.7  CL 97 98  CO2 29 24  GLUCOSE 116* 113*  BUN 7 5*  CREATININE 0.63 0.66  CALCIUM 9.3 9.7   PT/INR No results found for this basename: LABPROT:2,INR:2 in the last 72 hours ABG No results found for this basename: PHART:2,PCO2:2,PO2:2,HCO3:2 in the last 72 hours  Studies/Results: Ct Abdomen Pelvis Wo Contrast  03/12/2011  *RADIOLOGY REPORT*  Clinical Data: Right flank pain, denies history of stones  CT ABDOMEN AND PELVIS WITHOUT CONTRAST  Technique:  Multidetector CT imaging of the abdomen and pelvis was performed following the standard protocol without intravenous contrast.  Comparison: None.  Findings:  The lack of intravenous contrast on stability to  evaluate solid abdominal organs.  Normal hepatic contour.  No discrete fat pelvic lesions on this noncontrast examination.  The gallbladder is distended but otherwise normal without definite evidence of wall thickening or pericholecystic fluid.  No ascites.  There is a punctate (1-2 mm) stone within the right (image 36) and left kidneys (image 37). No evidence of hydronephrosis or ureterectasis.  No stones within the urinary bladder.  No discrete hyper hypoattenuating renal lesions.  There is mild thickening of the bilateral adrenal glands without discrete nodules.  The noncontrast appearance of the pancreas and spleen is normal.  The the bowel is normal in course and caliber without wall thickening or evidence of obstruction. Normal appendix.  No pneumoperitoneum, pneumatosis or portal venous gas.  There is scattered minimal atherosclerotic calcifications within a normal caliber abdominal aorta.  No retroperitoneal, mesenteric, pelvic or inguinal lymphadenopathy.  Limited visualization of the lower thorax demonstrates minimal right basilar subpleural heterogeneous opacities, likely atelectasis.  No focal airspace opacities.  No pleural effusion. Normal heart size.  No pericardial effusion.  Hypoattenuating (8 HU) approximately 3.1 x 1.5 cm left adnexal lesion may represent am ovarian cyst.  No free fluid within the pelvis.  No discrete right-sided adnexal lesion.  No acute aggressive osseous abnormalities.  Minimal degenerative change of L2 - L3.  IMPRESSION: 1.  Bilateral punctate (1-2 mm) nonobstructing stones without evidence of obstruction.  2.  Hypoattenuating left adnexal lesion may represent an ovarian cyst.  Further evaluation may be performed with pelvic ultrasound as clinically indicated.  Original Report Authenticated By: Waynard Reeds,  M.D.   US Abdomen Limited  03/12/2011  *RADIOLOGY REPORT*  Clinical Data:  Right upper quadrant pain  LIMITED ABDOMINAL ULTRASOUND - RIGHT UPPER QUADRANT   Comparison:  03/12/2011 CT  Findings:  Gallbladder:  Numerous echogenic shadowing gallstones noted, greatest diameter 2 cm.  Gallbladder wall appears thickened measuring up to 1.3 cm.  Trace pericholecystic fluid evident with an associated Murphy's sign over the gallbladder during the exam. Findings are compatible with acute calculus cholecystitis.  Common bile duct:  5 mm diameter.  No biliary dilatation or obstruction.  Liver:  No intrahepatic biliary dilatation or focal hepatic abnormality.  Portal vein is patent.  IMPRESSION: Findings compatible with calculus cholecystitis as described.                   Original Report Authenticated By: Judie Petit. Ruel Favors, M.D.    Anti-infectives: Anti-infectives     Start     Dose/Rate Route Frequency Ordered Stop   03/13/11 1111   ceFAZolin (ANCEF) IVPB 1 g/50 mL premix        1 g 100 mL/hr over 30 Minutes Intravenous 60 min pre-op 03/13/11 1111     03/12/11 1015   piperacillin-tazobactam (ZOSYN) IVPB 3.375 g        3.375 g 12.5 mL/hr over 240 Minutes Intravenous  Once 03/12/11 1011 03/12/11 1108          Assessment/Plan: s/p Procedure(s): LAPAROSCOPIC CHOLECYSTECTOMY Patient seen and evaluated in the preop holding area. We will plan to proceed to the operating room for a laparoscopic cholecystectomy as discussed with the patient. Risks benefits alternatives again were discussed with the patient.  LOS: 1 day    Antonae Zbikowski C 03/13/2011

## 2011-03-13 NOTE — Op Note (Signed)
Patient:  Elizabeth Bullock  DOB:  02-Mar-1966  MRN:  [id number]   Preop Diagnosis:  Acute cholecystitis  Postop Diagnosis:  The same  Procedure:  Laparoscopic cholecystectomy  Surgeon:  Dr. Tilford Pillar  Anes:  General endotracheal  Indications:  Patient is a [age] female presented to [facility] with right upper quadrant abdominal pain. Workup and evaluation was consistent for acute cholecystitis. Patient was resuscitated was taken to the operating room after the risks benefits alternatives were discussed at length. Her questions and concerns were addressed the patient was consented for the planned procedure.  Procedure note:  Patient was taken to the OR was placed in a supine position on the operating table time the general anesthetic was administered. With the patient was asleep she was endotracheally intubated by anesthesia. At this point her abdomen was prepped with DuraPrep solution and draped in standard fashion. A stab incision was created supraumbilically with 11 blade scalpel with additional dissection down through the subcuticular tissue with a Coker clamp. This was utilized to grasp the anterior abdominal wall fascia and lift this anteriorly. A Veress needle was inserted saline drop test was utilized to confirm intraperitoneal placement and then pneumoperitoneum was initiated. Once sufficient pneumoperitoneum was obtained an 11 mm trocar was inserted over a laparoscope allowing visualization of the trocar entering into the peritoneal cavity. At this point the inner cannula was removed the laparoscope was reinserted there is no evidence of any trocar or Veress needle placement injury. At this point the patient was placed into a reverse Trendelenburg left lateral decubitus position. The remaining trochars replaced with a 5 mm trocar in the epigastrium a 5 mm trocar in the midline and a 5 mm trocar in the right lateral abdominal wall. Due to the tense nature of the gallbladder a Weck  needle was utilized to aspirate the contents of the gallbladder. Approximately 80 mL as of clear fluid was obtained. This decompressed the gallbladder sufficiently to grasp the fundus. The fundus was lifted up and over the right lobe the liver. Blunt peritoneal dissection was carried out using a [place] and suction irrigator to expose the cystic duct entering into the infundibulum. Fornical is placed proximally one distally and the cystic duct was divided between 2 most distal clips. Similarly the cystic artery was identified and a window was created behind this. 2 endoclips placed proximally one distally and the cystic artery was divided between 2 most distal clips. At this point electrocautery was utilized to dissect the gallbladder free from the gallbladder fossa. Once free it was placed in a Endo Catch bag. Inspection of the gallbladder fossa demonstrated a raw surface and therefore did place a piece of Surgicel snow into the gallbladder fossa. Hemostasis was excellent. The endoclips were excellent position and no evidence of any bleeding or bile leak. At this time attention was turned to closure.  A Endo Close suture passing device was utilized to pass a 2-0 Vicryl suture through the umbilical trocar site. With this in place the gallbladder was retrieved and removed through the umbilical trocar site. Some blunt and sharp dilatation was required to adequately enlarged the ability trocar site enough to remove the gallbladder. It was removed in an intact Endo Catch bag and was placed in the back table as a permanent specimen to pathology. At this point the pneumoperitoneum was evacuated. Trochars were removed. I added additional Vicryl suture to close the fascia at the umbilical trocar site. The local anesthetic was instilled. A 4-0  Monocryl was utilized to reapproximate the skin edges at all 4 trocar sites. The skin was washed dried moist dry towel. Benzoin is applied around incision half inch  Steri-Strips are placed. The drapes removed the patient was allowed to vaginal site was transferred to the postanesthetic area in stable condition. At the conclusion of procedure all instrument, sponge, and needle counts are correct. The patient tolerated procedure extremely well.  Complications:  None  EBL:  Less than 100 mL  Specimen:  Gallbladder

## 2011-03-13 NOTE — Interval H&P Note (Signed)
History and Physical Interval Note:   03/13/2011   11:43 AM   Elizabeth Bullock  has presented today for surgery, with the diagnosis of Acute cholecystitis  The various methods of treatment have been discussed with the patient and family. After consideration of risks, benefits and other options for treatment, the patient has consented to  Procedure(s): LAPAROSCOPIC CHOLECYSTECTOMY as a surgical intervention .  I have reviewed the patients' chart and labs.  Questions were answered to the patient's satisfaction.     Fabio Bering  MD

## 2011-03-13 NOTE — Anesthesia Procedure Notes (Signed)
Procedure Name: Intubation Date/Time: 03/13/2011 12:08 PM Performed by: Despina Hidden Pre-anesthesia Checklist: Patient identified, Patient being monitored, Timeout performed, Emergency Drugs available and Suction available Patient Re-evaluated:Patient Re-evaluated prior to inductionOxygen Delivery Method: Circle System Utilized Preoxygenation: Pre-oxygenation with 100% oxygen Intubation Type: IV induction, Rapid sequence, Inhalational induction and Circoid Pressure applied Ventilation: Mask ventilation without difficulty Laryngoscope Size: 3 and Mac Grade View: Grade I Tube type: Oral Tube size: 7.0 mm Number of attempts: 1 Airway Equipment and Method: stylet Placement Confirmation: ETT inserted through vocal cords under direct vision,  positive ETCO2 and breath sounds checked- equal and bilateral Secured at: 24 cm Tube secured with: Tape Dental Injury: Teeth and Oropharynx as per pre-operative assessment

## 2011-03-14 ENCOUNTER — Encounter (HOSPITAL_COMMUNITY): Payer: Self-pay | Admitting: Anesthesiology

## 2011-03-14 LAB — COMPREHENSIVE METABOLIC PANEL
ALT: 66 U/L — ABNORMAL HIGH (ref 0–35)
AST: 49 U/L — ABNORMAL HIGH (ref 0–37)
Albumin: 2.8 g/dL — ABNORMAL LOW (ref 3.5–5.2)
CO2: 30 mEq/L (ref 19–32)
Calcium: 8.6 mg/dL (ref 8.4–10.5)
Creatinine, Ser: 0.56 mg/dL (ref 0.50–1.10)
GFR calc non Af Amer: >60 mL/min (ref >60)
Sodium: 137 mEq/L (ref 135–145)

## 2011-03-14 LAB — CBC
HCT: 36 % (ref 36.0–46.0)
Hemoglobin: 12.3 g/dL (ref 12.0–15.0)
MCH: 29.7 pg (ref 26.0–34.0)
MCHC: 34.2 g/dL (ref 30.0–36.0)
MCV: 87 fL (ref 78.0–100.0)
RBC: 4.14 MIL/uL (ref 3.87–5.11)

## 2011-03-14 MED ORDER — POTASSIUM CHLORIDE CRYS ER 20 MEQ PO TBCR
40.0000 meq | EXTENDED_RELEASE_TABLET | Freq: Once | ORAL | Status: AC
  Administered 2011-03-14: 40 meq via ORAL
  Filled 2011-03-14: qty 2

## 2011-03-14 MED ORDER — HYDROCODONE-ACETAMINOPHEN 5-325 MG PO TABS: 1.0000 | ORAL_TABLET | ORAL | Status: AC | PRN

## 2011-03-14 NOTE — Discharge Summary (Signed)
Physician Discharge Summary  Patient ID: Elizabeth Bullock MRN: [id number] DOB/AGE: [birth year] 45 y.o.  Admit date: 03/12/2011 Discharge date: 03/14/2011  Admission Diagnoses:Acute cholecystitis   Discharge Diagnoses: The same now s/p L/s chole Active Problems:  * No active hospital problems. *    Discharged Condition: good  Hospital Course: Pt was admitted for RUQ pain.  Resusitated and taken to the OR.  Underwent L/s Chole.  Tolerated well.  Advanced on diet.  Pain controlled on Oral pain meds.  Ambulatory.  Consults: none  Significant Diagnostic Studies: radiology: Ultrasound: RUQ.  +acute chole  Treatments: surgery: l/s chole  Discharge Exam: Blood pressure 109/76, pulse 87, temperature 98.1 F (36.7 C), temperature source Oral, resp. rate 18, height 5\' 4"  (1.626 m), weight 79.4 kg (175 lb 0.7 oz), SpO2 96.00%. General appearance: alert and no distress Resp: clear to auscultation bilaterally Cardio: regular rate and rhythm GI: soft, +BS, ND, expected tenderness.  Inc c/d/i.  Disposition: Final discharge disposition not confirmed   Current Discharge Medication List    CONTINUE these medications which have NOT CHANGED   Details  amitriptyline (ELAVIL) 50 MG tablet Take 50 mg by mouth 2 (two) times daily.      ibuprofen (ADVIL,MOTRIN) 200 MG tablet Take 800 mg by mouth every 6 (six) hours as needed. Pain     meloxicam (MOBIC) 15 MG tablet Take 15 mg by mouth daily.      propranolol (INDERAL) 40 MG tablet Take 40 mg by mouth 2 (two) times daily.      sertraline (ZOLOFT) 50 MG tablet Take 50 mg by mouth daily.           SignedFabio Bering 03/14/2011, 10:41 AM

## 2011-03-14 NOTE — Progress Notes (Signed)
Pt discharged home today per Dr. Leticia Penna. Pt's IV site D/C'd and WNL. Pt's VS stable at this time. Pt provided with home medication list, discharge instructions and prescriptions. Pt verbalized understanding. Pt left floor in stable condition accompanied by Ishmael Holter, NT. Dagoberto Ligas, RN

## 2011-03-14 NOTE — Anesthesia Postprocedure Evaluation (Signed)
  Anesthesia Post-op Note  Patient: Elizabeth Bullock  Procedure(s) Performed:  LAPAROSCOPIC CHOLECYSTECTOMY  Patient Location: Room 311  Anesthesia Type: General  Level of Consciousness: awake, alert , oriented and patient cooperative  Airway and Oxygen Therapy: Patient Spontanous Breathing  Post-op Pain: none  Post-op Assessment: Post-op Vital signs reviewed, Patient's Cardiovascular Status Stable, Respiratory Function Stable, No signs of Nausea or vomiting, Adequate PO intake and Pain level controlled  Post-op Vital Signs: Reviewed and stable  Complications: No apparent anesthesia complications

## 2011-03-19 ENCOUNTER — Encounter (HOSPITAL_COMMUNITY): Payer: Self-pay | Admitting: General Surgery

## 2011-04-07 LAB — BASIC METABOLIC PANEL
BUN: 8
CO2: 24
Calcium: 8.9
Chloride: 110
Creatinine, Ser: 0.74
GFR calc Af Amer: >60
Glucose, Bld: 82

## 2011-04-07 LAB — CBC
MCHC: 33.3
MCV: 85.2
RBC: 4.64
RDW: 14.2

## 2011-04-07 LAB — DIFFERENTIAL
Basophils Relative: 1
Eosinophils Absolute: 0.1 — ABNORMAL LOW
Monocytes Relative: 8
Neutrophils Relative %: 52

## 2011-11-15 ENCOUNTER — Encounter (HOSPITAL_COMMUNITY): Payer: Self-pay

## 2011-11-15 ENCOUNTER — Emergency Department (HOSPITAL_COMMUNITY)
Admission: EM | Admit: 2011-11-15 | Discharge: 2011-11-15 | Disposition: A | Discharge status: Home / Self Care | Payer: Self-pay | Attending: Emergency Medicine | Admitting: Emergency Medicine

## 2011-11-15 DIAGNOSIS — Z23 Encounter for immunization: Secondary | ICD-10-CM | POA: Insufficient documentation

## 2011-11-15 DIAGNOSIS — S61209A Unspecified open wound of unspecified finger without damage to nail, initial encounter: Secondary | ICD-10-CM | POA: Insufficient documentation

## 2011-11-15 DIAGNOSIS — S61219A Laceration without foreign body of unspecified finger without damage to nail, initial encounter: Secondary | ICD-10-CM

## 2011-11-15 DIAGNOSIS — W261XXA Contact with sword or dagger, initial encounter: Secondary | ICD-10-CM | POA: Insufficient documentation

## 2011-11-15 DIAGNOSIS — W260XXA Contact with knife, initial encounter: Secondary | ICD-10-CM | POA: Insufficient documentation

## 2011-11-15 MED ORDER — BACITRACIN ZINC 500 UNIT/GM EX OINT
TOPICAL_OINTMENT | CUTANEOUS | Status: AC
  Administered 2011-11-15: 21:00:00
  Filled 2011-11-15: qty 0.9

## 2011-11-15 MED ORDER — TETANUS-DIPHTH-ACELL PERTUSSIS 5-2.5-18.5 LF-MCG/0.5 IM SUSP
0.5000 mL | Freq: Once | INTRAMUSCULAR | Status: AC
  Administered 2011-11-15: 0.5 mL via INTRAMUSCULAR
  Filled 2011-11-15: qty 0.5

## 2011-11-15 MED ORDER — LIDOCAINE HCL (PF) 1 % IJ SOLN
INTRAMUSCULAR | Status: AC
  Administered 2011-11-15: 20:00:00
  Filled 2011-11-15: qty 5

## 2011-11-15 NOTE — ED Provider Notes (Signed)
History     CSN: [id number]  Arrival date & time 11/15/11  1905   First MD Initiated Contact with Patient 11/15/11 1929      Chief Complaint  Patient presents with  . Laceration    (Consider location/radiation/quality/duration/timing/severity/associated sxs/prior treatment) Patient is a 46 y.o. female presenting with skin laceration. The history is provided by the patient.  Laceration  The incident occurred 1 to 2 hours ago. Pain location: right index finger. Size: 1.5 cm. The laceration mechanism was a a clean knife. The pain is mild. The pain has been constant since onset. She reports no foreign bodies present. Her tetanus status is unknown.    Past Medical History  Diagnosis Date  . Hypertension   . Bronchitis, chronic   . Headache     History of migraines,  takes propanolol for headaches    Past Surgical History  Procedure Date  . Abdominal hysterectomy   . Ankle surgery     left ankle surgery as teenager per pt  . Cholecystectomy 03/13/2011    Procedure: LAPAROSCOPIC CHOLECYSTECTOMY;  Surgeon: Fabio Bering;  Location: [facility];  Service: General;  Laterality: N/A;    No family history on file.  History  Substance Use Topics  . Smoking status: Current Everyday Smoker -- 1.0 packs/day    Types: Cigarettes  . Smokeless tobacco: Not on file  . Alcohol Use: No    OB History    Grav Para Term Preterm Abortions TAB SAB Ect Mult Living                  Review of Systems  Musculoskeletal: Negative for joint swelling and arthralgias.  Skin: Positive for wound. Negative for color change.       laceration  Hematological: Does not bruise/bleed easily.  All other systems reviewed and are negative.    Allergies  Codeine; Influenza virus vacc split pf; and Morphine and related  Home Medications   Current Outpatient Rx  Name Route Sig Dispense Refill  . IBUPROFEN 200 MG PO TABS Oral Take 800 mg by mouth every 6 (six) hours as needed. Pain     . AMITRIPTYLINE  HCL 50 MG PO TABS Oral Take 50 mg by mouth 2 (two) times daily.      . MELOXICAM 15 MG PO TABS Oral Take 15 mg by mouth daily.      Marland Kitchen PROPRANOLOL HCL 40 MG PO TABS Oral Take 40 mg by mouth 2 (two) times daily.      . SERTRALINE HCL 50 MG PO TABS Oral Take 50 mg by mouth daily.        BP 141/81  Pulse 87  Temp(Src) 98.3 F (36.8 C) (Oral)  Resp 20  Ht 5\' 6"  (1.676 m)  Wt 165 lb (74.844 kg)  BMI 26.63 kg/m2  SpO2 100%  Physical Exam  Nursing note and vitals reviewed. Constitutional: She is oriented to person, place, and time. She appears well-developed and well-nourished. No distress.  HENT:  Head: Normocephalic and atraumatic.  Cardiovascular: Normal rate, regular rhythm and normal heart sounds.   Pulmonary/Chest: Effort normal and breath sounds normal.  Musculoskeletal: She exhibits tenderness. She exhibits no edema.       Right hand: She exhibits tenderness and laceration. She exhibits normal range of motion, no bony tenderness, normal two-point discrimination, normal capillary refill, no deformity and no swelling. normal sensation noted. Normal strength noted.       Hands: Neurological: She is alert and oriented to  person, place, and time. She exhibits normal muscle tone. Coordination normal.  Skin: Laceration noted.       Laceration to the distal tip of the right index finger.  Bleeding controlled.  See MS exam    ED Course  Procedures (including critical care time)    LACERATION REPAIR Performed by: Javon Snee L. Authorized by: Maxwell Caul Consent: Verbal consent obtained. Risks and benefits: risks, benefits and alternatives were discussed Consent given by: patient Patient identity confirmed: provided demographic data Prepped and Draped in normal sterile fashion Wound explored  Laceration Location: left index finger Laceration Length: 1.5 cm  No Foreign Bodies seen or palpated  Anesthesia: local infiltration  Local anesthetic: lidocaine1% w/o  epinephrine  Anesthetic total: 2 ml  Irrigation method: syringe Amount of cleaning: standard  Skin closure: 4-0 ethilon Number of sutures: 3 Technique: simple interrupted  Patient tolerance: Patient tolerated the procedure well with no immediate complications.    MDM     Previous medical charts, nursing notes and vitals signs from this visit were reviewed by me   All laboratory results and/or imaging results performed on this visit, if applicable, were reviewed by me and discussed with the patient and/or parent as well as recommendation for follow-up    MEDICATIONS GIVEN IN ED:  TDaP IM    PRESCRIPTIONS GIVEN AT DISCHARGE:  none   Pt stable in ED with no significant deterioration in condition. Pt feels improved after observation and/or treatment in ED. Patient / Family / Caregiver understand and agree with initial ED impression and plan with expectations set for ED visit.  Patient agrees to return to ED for any worsening symptoms     Wound(s) explored with adequate hemostasis through ROM, no apparent gross foreign body retained, no significant involvement of deep structures such as bone / joint / tendon / or neurovascular involvement noted.  Baseline Strength and Sensation to affected extremity(ies) with normal light touch for Pt, distal NVI with CR< 2 secs and pulse(s) intact to affected extremity(ies).       Xzaria Teo L. Jeferson Boozer, [place] 11/17/11 1756

## 2011-11-15 NOTE — ED Notes (Signed)
Pt presents with left tip of index finger laceration. Pt states cut finger on electric meat slicer  approximately 1 hour prior to arrival. Bleeding controlled. Finger nail intact. Unknown tetanus immunization

## 2011-11-15 NOTE — ED Notes (Signed)
Laceration to left index finger from meat slicer

## 2011-11-15 NOTE — Discharge Instructions (Signed)
Fingertip Laceration  The treatment of fingertip injuries depends on how large the cut is and whether the bone or nail tissue has been damaged. Amputations of the skin over the tip of the finger that is smaller than a dime (smaller than 1cm) will usually heal very well from the sides without any treatment other than cleaning the wound and changing the dressing.  Keep your hand elevated for the next 2 to 3 days to reduce pain and swelling. A splint over the fingertip may be needed to protect your injury. If your cut is being allowed to heal in from the sides, you should soak it in warm water and change the dressing daily.   You may need a tetanus shot if:   You cannot remember when you had your last tetanus shot.   You have never had a tetanus shot.   The injury broke your skin.  If you got a tetanus shot, your arm may swell, get red, and feel warm to the touch. This is common and not a problem. If you need a tetanus shot and you choose not to have one, there is a rare chance of getting tetanus. Sickness from tetanus can be serious.  SEEK MEDICAL CARE IF:    There are any signs of infection: increased redness, swelling, and pain, or sometimes pus drainage.  Document Released: 07/24/2004 Document Revised: 06/05/2011 Document Reviewed: 07/20/2008  [employer] Patient Information 2012 [employer].

## 2011-11-18 NOTE — ED Provider Notes (Signed)
Medical screening examination/treatment/procedure(s) were performed by non-physician practitioner and as supervising physician I was immediately available for consultation/collaboration.   Jadd Gasior, MD 11/18/11 1352 

## 2013-01-04 ENCOUNTER — Encounter (HOSPITAL_COMMUNITY): Payer: Self-pay | Admitting: *Deleted

## 2013-01-04 ENCOUNTER — Emergency Department (HOSPITAL_COMMUNITY): Payer: Self-pay

## 2013-01-04 ENCOUNTER — Emergency Department (HOSPITAL_COMMUNITY)
Admission: EM | Admit: 2013-01-04 | Discharge: 2013-01-04 | Disposition: A | Discharge status: Home / Self Care | Payer: Self-pay | Attending: Emergency Medicine | Admitting: Emergency Medicine

## 2013-01-04 DIAGNOSIS — F17200 Nicotine dependence, unspecified, uncomplicated: Secondary | ICD-10-CM | POA: Insufficient documentation

## 2013-01-04 DIAGNOSIS — K529 Noninfective gastroenteritis and colitis, unspecified: Secondary | ICD-10-CM

## 2013-01-04 DIAGNOSIS — Z9089 Acquired absence of other organs: Secondary | ICD-10-CM | POA: Insufficient documentation

## 2013-01-04 DIAGNOSIS — G43909 Migraine, unspecified, not intractable, without status migrainosus: Secondary | ICD-10-CM | POA: Insufficient documentation

## 2013-01-04 DIAGNOSIS — Z8709 Personal history of other diseases of the respiratory system: Secondary | ICD-10-CM | POA: Insufficient documentation

## 2013-01-04 DIAGNOSIS — K5289 Other specified noninfective gastroenteritis and colitis: Secondary | ICD-10-CM | POA: Insufficient documentation

## 2013-01-04 DIAGNOSIS — Z90711 Acquired absence of uterus with remaining cervical stump: Secondary | ICD-10-CM | POA: Insufficient documentation

## 2013-01-04 DIAGNOSIS — R112 Nausea with vomiting, unspecified: Secondary | ICD-10-CM | POA: Insufficient documentation

## 2013-01-04 DIAGNOSIS — I10 Essential (primary) hypertension: Secondary | ICD-10-CM | POA: Insufficient documentation

## 2013-01-04 LAB — BASIC METABOLIC PANEL
GFR calc Af Amer: >90 mL/min (ref >90)
GFR calc non Af Amer: 78 mL/min — ABNORMAL LOW (ref >90)
Glucose, Bld: 129 mg/dL — ABNORMAL HIGH (ref 70–99)
Potassium: 4.5 mEq/L (ref 3.5–5.1)
Sodium: 139 mEq/L (ref 135–145)

## 2013-01-04 LAB — URINALYSIS, ROUTINE W REFLEX MICROSCOPIC
Hgb urine dipstick: NEGATIVE
Leukocytes, UA: NEGATIVE
Nitrite: NEGATIVE
Protein, ur: NEGATIVE mg/dL
Specific Gravity, Urine: 1.015 (ref 1.005–1.030)
Urobilinogen, UA: 0.2 mg/dL (ref 0.0–1.0)

## 2013-01-04 LAB — CBC WITH DIFFERENTIAL/PLATELET
Basophils Absolute: 0 10*3/uL (ref 0.0–0.1)
Eosinophils Absolute: 0.2 10*3/uL (ref 0.0–0.7)
Lymphs Abs: 1.6 10*3/uL (ref 0.7–4.0)
MCH: 29.6 pg (ref 26.0–34.0)
Neutrophils Relative %: 81 % — ABNORMAL HIGH (ref 43–77)
Platelets: 273 10*3/uL (ref 150–400)
RBC: 5.44 MIL/uL — ABNORMAL HIGH (ref 3.87–5.11)
RDW: 12.8 % (ref 11.5–15.5)
WBC: 11.5 10*3/uL — ABNORMAL HIGH (ref 4.0–10.5)

## 2013-01-04 LAB — HEPATIC FUNCTION PANEL
Albumin: 4.2 g/dL (ref 3.5–5.2)
Total Protein: 7.6 g/dL (ref 6.0–8.3)

## 2013-01-04 LAB — LIPASE, BLOOD: Lipase: 30 U/L (ref 11–59)

## 2013-01-04 MED ORDER — HYDROCODONE-ACETAMINOPHEN 5-325 MG PO TABS: 1.0000 | ORAL_TABLET | Freq: Four times a day (QID) | ORAL | Status: DC | PRN

## 2013-01-04 MED ORDER — HYDROMORPHONE HCL PF 1 MG/ML IJ SOLN
1.0000 mg | Freq: Once | INTRAMUSCULAR | Status: AC
  Administered 2013-01-04: 1 mg via INTRAVENOUS
  Filled 2013-01-04: qty 1

## 2013-01-04 MED ORDER — ONDANSETRON HCL 4 MG/2ML IJ SOLN
4.0000 mg | Freq: Once | INTRAMUSCULAR | Status: AC
  Administered 2013-01-04: 4 mg via INTRAVENOUS
  Filled 2013-01-04: qty 2

## 2013-01-04 MED ORDER — IOHEXOL 300 MG/ML  SOLN
100.0000 mL | Freq: Once | INTRAMUSCULAR | Status: AC | PRN
  Administered 2013-01-04: 100 mL via INTRAVENOUS

## 2013-01-04 MED ORDER — PROMETHAZINE HCL 25 MG PO TABS: 25.0000 mg | ORAL_TABLET | Freq: Four times a day (QID) | ORAL | Status: DC | PRN

## 2013-01-04 MED ORDER — IOHEXOL 300 MG/ML  SOLN
50.0000 mL | Freq: Once | INTRAMUSCULAR | Status: AC | PRN
  Administered 2013-01-04: 50 mL via ORAL

## 2013-01-04 MED ORDER — RANITIDINE HCL 150 MG PO CAPS: 150.0000 mg | ORAL_CAPSULE | Freq: Two times a day (BID) | ORAL | Status: DC

## 2013-01-04 NOTE — Progress Notes (Signed)
Notified Tiffany O RN of patient's return from CT

## 2013-01-04 NOTE — ED Notes (Signed)
Patient with no complaints at this time. Respirations even and unlabored. Skin warm/dry. Discharge instructions reviewed with patient at this time. Patient given opportunity to voice concerns/ask questions. IV removed per policy and band-aid applied to site. Patient discharged at this time and left Emergency Department via wheelchair.  

## 2013-01-04 NOTE — ED Provider Notes (Signed)
History  This chart was scribed for Elizabeth Lennert, MD, by Elizabeth Bullock, ED Scribe. This patient was seen in room APA02/APA02 and the patient's care was started at 3:40 PM  CSN: [id number] Arrival date & time 01/04/13  1425  First MD Initiated Contact with Patient 01/04/13 1533     Chief Complaint  Patient presents with  . Abdominal Pain    Patient is a 47 y.o. female presenting with abdominal pain. The history is provided by the patient. No language interpreter was used.  Abdominal Pain This is a new problem. The current episode started 12 to 24 hours ago. The problem occurs constantly. The problem has not changed since onset.Associated symptoms include abdominal pain. Nothing aggravates the symptoms. Nothing relieves the symptoms. She has tried nothing for the symptoms. The treatment provided no relief.   HPI Comments: Elizabeth Bullock is a 47 y.o. female who presents to the Emergency Department complaining of sudden onset of sharp stabbing lower abdominal pain that started this morning.  Pt is also experiencing nausea, vomiting.  She denies diarrhea.  Pt has h/o partial hysterectomy and cholecystectomy.    No PCP Past Medical History  Diagnosis Date  . Hypertension   . Bronchitis, chronic   . Headache(784.0)     History of migraines,  takes propanolol for headaches   Past Surgical History  Procedure Laterality Date  . Abdominal hysterectomy    . Ankle surgery      left ankle surgery as teenager per pt  . Cholecystectomy  03/13/2011    Procedure: LAPAROSCOPIC CHOLECYSTECTOMY;  Surgeon: Elizabeth Bullock;  Location: [facility];  Service: General;  Laterality: N/A;   History reviewed. No pertinent family history. History  Substance Use Topics  . Smoking status: Current Every Day Smoker -- 1.00 packs/day    Types: Cigarettes  . Smokeless tobacco: Not on file  . Alcohol Use: No   OB History   Grav Para Term Preterm Abortions TAB SAB Ect Mult Living                 Review of  Systems  Gastrointestinal: Positive for nausea, vomiting and abdominal pain.  All other systems reviewed and are negative.    Allergies  Codeine; Influenza virus vacc split pf; and Morphine and related  Home Medications   Current Outpatient Rx  Name  Route  Sig  Dispense  Refill  . Aspirin-Salicylamide-Caffeine (BC HEADACHE POWDER PO)   Oral   Take 1 packet by mouth daily as needed (FOR PAIN).         Marland Kitchen ibuprofen (ADVIL,MOTRIN) 200 MG tablet   Oral   Take 800 mg by mouth every 8 (eight) hours as needed for pain. Pain         . sodium-potassium bicarbonate (ALKA-SELTZER GOLD) TBEF   Oral   Take 1 tablet by mouth daily as needed (for upset stomach).          BP 162/90  Pulse 54  Temp(Src) 96.5 F (35.8 C) (Oral)  Resp 18  Ht 5\' 6"  (1.676 m)  Wt 140 lb (63.504 kg)  BMI 22.61 kg/m2  SpO2 98% Physical Exam  Nursing note and vitals reviewed. Constitutional: She is oriented to person, place, and time. She appears well-developed and well-nourished. No distress.  HENT:  Head: Normocephalic and atraumatic.  Eyes: EOM are normal.  Neck: Neck supple. No tracheal deviation present.  Cardiovascular: Normal rate.   Pulmonary/Chest: Effort normal. No respiratory distress.  Abdominal: Soft. There  is tenderness (moderate epigastric tenderness).  Musculoskeletal: Normal range of motion.  Neurological: She is alert and oriented to person, place, and time.  Skin: Skin is warm and dry.  Psychiatric: She has a normal mood and affect. Her behavior is normal.    ED Course  Procedures   DIAGNOSTIC STUDIES: Oxygen Saturation is 98% on room air, normal by my interpretation.    COORDINATION OF CARE:  3:42 PM Discussed course of care with pt who understands and agrees.   6:35 PM Discussed images with pt.  Pt reports she is feeling better.  Discussed [place] of ovary.  Pt understands and agrees.     Labs Reviewed  CBC WITH DIFFERENTIAL - Abnormal; Notable for the following:    WBC  11.5 (*)    RBC 5.44 (*)    Hemoglobin 16.1 (*)    Neutrophils Relative % 81 (*)    Neutro Abs 9.3 (*)    All other components within normal limits  BASIC METABOLIC PANEL - Abnormal; Notable for the following:    Glucose, Bld 129 (*)    GFR calc non Af Amer 78 (*)    All other components within normal limits  HEPATIC FUNCTION PANEL  LIPASE, BLOOD  URINALYSIS, ROUTINE W REFLEX MICROSCOPIC   Ct Abdomen Pelvis W Contrast  01/04/2013   *RADIOLOGY REPORT*  Clinical Data: Pain  CT ABDOMEN AND PELVIS WITH CONTRAST  Technique:  Multidetector CT imaging of the abdomen and pelvis was performed following the standard protocol during bolus administration of intravenous contrast.  Contrast: 50mL OMNIPAQUE IOHEXOL 300 MG/ML  SOLN, OMNIPAQUE IOHEXOL 300 MG/ML  SOLN  Comparison: 03/12/2011  Findings:  Dependent changes noted within the lung bases.  Pulmonary nodule in the right lower lobe measures 4 mm, image 2/series 3.  No pleural effusion identified.   Mild diffuse low attenuation within the liver parenchyma is identified.  The patient is status post cholecystectomy.  No biliary dilatation.  Normal appearance of the spleen.  The right adrenal gland is normal.  Left adrenal nodule is identified measuring 1 cm, image 24/series 2.  This is compared with the same previously.  Tiny stone is identified within the upper pole of the right kidney measuring 2 mm, image 28/series 2. Left kidney appears normal.  No hydronephrosis or hydroureter. Urinary bladder appears normal.  Previous hysterectomy.  There is a small amount of free fluid noted within the pelvis.  Septated cystic fluid attenuating mass arising from the left adnexa measures 5.8 x 5.3 x 6.0 cm.  There is calcified atherosclerotic change involving the abdominal aorta.  There is no aneurysm.  There is no upper abdominal adenopathy.  The there is no pelvic or inguinal adenopathy identified.  The stomach is normal.  Mildly dilated there are several mildly  dilated loops of proximal small bowel within the left upper quadrant of the abdomen.  These measure up to 3 cm and have a fluid levels.  The distal small bowel loops appear normal.  The appendix is visualized and is unremarkable.  Normal appearance of the colon.  IMPRESSION:  1. Mild increased caliber of left upper quadrant small bowel loops containing air fluid levels.  Findings may reflect an inflammatory or infectious enteritis.  No complicating features noted.  2.  Abnormal septated cystic mass within the left adnexa. Findings may represent a benign or malignant cystic ovarian lesion.  Advise further evaluation with pelvic sonogram. 3.  Pulmonary nodule in the right lower lobe measures 4 mm.  If the patient is at high risk for bronchogenic carcinoma, follow-up chest CT at 1 year is recommended.  If the patient is at low risk, no follow-up is needed.  This recommendation follows the consensus statement: Guidelines for Management of Small Pulmonary Nodules Detected on CT Scans:  A Statement from the Fleischner Society as published in Radiology 2005; 237:395-400.   Original Report Authenticated By: Signa Kell, M.D.   No diagnosis found.  MDM  Gastroenteritis with cystic mass in ovary.  Pelvic US and follow up with gyn The chart was scribed for me under my direct supervision.  I personally performed the history, physical, and medical decision making and all procedures in the evaluation of this patient.Elizabeth Lennert, MD 01/04/13 [phone]

## 2013-01-04 NOTE — ED Notes (Signed)
Sharp, stabbing abdominal pain woke patient this AM.  Mid abdomen to pelvis.  Has been vomiting x 6 today.  No diarrhea.  Chills, diaphoresis

## 2013-08-12 ENCOUNTER — Emergency Department (HOSPITAL_COMMUNITY)
Admission: EM | Admit: 2013-08-12 | Discharge: 2013-08-12 | Disposition: A | Discharge status: Home / Self Care | Payer: Self-pay | Attending: Emergency Medicine | Admitting: Emergency Medicine

## 2013-08-12 ENCOUNTER — Encounter (HOSPITAL_COMMUNITY): Payer: Self-pay | Admitting: Emergency Medicine

## 2013-08-12 DIAGNOSIS — Z3202 Encounter for pregnancy test, result negative: Secondary | ICD-10-CM | POA: Insufficient documentation

## 2013-08-12 DIAGNOSIS — Z880 Allergy status to penicillin: Secondary | ICD-10-CM | POA: Insufficient documentation

## 2013-08-12 DIAGNOSIS — G43909 Migraine, unspecified, not intractable, without status migrainosus: Secondary | ICD-10-CM | POA: Insufficient documentation

## 2013-08-12 DIAGNOSIS — Z8709 Personal history of other diseases of the respiratory system: Secondary | ICD-10-CM | POA: Insufficient documentation

## 2013-08-12 DIAGNOSIS — R197 Diarrhea, unspecified: Secondary | ICD-10-CM | POA: Insufficient documentation

## 2013-08-12 DIAGNOSIS — Z79899 Other long term (current) drug therapy: Secondary | ICD-10-CM | POA: Insufficient documentation

## 2013-08-12 DIAGNOSIS — R1013 Epigastric pain: Secondary | ICD-10-CM | POA: Insufficient documentation

## 2013-08-12 DIAGNOSIS — I10 Essential (primary) hypertension: Secondary | ICD-10-CM | POA: Insufficient documentation

## 2013-08-12 DIAGNOSIS — R112 Nausea with vomiting, unspecified: Secondary | ICD-10-CM

## 2013-08-12 DIAGNOSIS — F17200 Nicotine dependence, unspecified, uncomplicated: Secondary | ICD-10-CM | POA: Insufficient documentation

## 2013-08-12 DIAGNOSIS — R6883 Chills (without fever): Secondary | ICD-10-CM | POA: Insufficient documentation

## 2013-08-12 LAB — COMPREHENSIVE METABOLIC PANEL
ALBUMIN: 3.9 g/dL (ref 3.5–5.2)
ALK PHOS: 67 U/L (ref 39–117)
ALT: 9 U/L (ref 0–35)
AST: 12 U/L (ref 0–37)
BILIRUBIN TOTAL: 0.7 mg/dL (ref 0.3–1.2)
BUN: 11 mg/dL (ref 6–23)
CHLORIDE: 103 meq/L (ref 96–112)
CO2: 25 meq/L (ref 19–32)
CREATININE: 0.65 mg/dL (ref 0.50–1.10)
Calcium: 8.8 mg/dL (ref 8.4–10.5)
GFR calc Af Amer: >90 mL/min (ref >90)
Glucose, Bld: 122 mg/dL — ABNORMAL HIGH (ref 70–99)
POTASSIUM: 4.1 meq/L (ref 3.7–5.3)
Sodium: 140 mEq/L (ref 137–147)
Total Protein: 7.2 g/dL (ref 6.0–8.3)

## 2013-08-12 LAB — CBC WITH DIFFERENTIAL/PLATELET
BASOS PCT: 0 % (ref 0–1)
Basophils Absolute: 0 10*3/uL (ref 0.0–0.1)
Eosinophils Absolute: 0 10*3/uL (ref 0.0–0.7)
Eosinophils Relative: 0 % (ref 0–5)
HCT: 44.2 % (ref 36.0–46.0)
Hemoglobin: 15.6 g/dL — ABNORMAL HIGH (ref 12.0–15.0)
LYMPHS ABS: 0.2 10*3/uL — AB (ref 0.7–4.0)
Lymphocytes Relative: 3 % — ABNORMAL LOW (ref 12–46)
MCH: 30.3 pg (ref 26.0–34.0)
MCHC: 35.3 g/dL (ref 30.0–36.0)
MCV: 85.8 fL (ref 78.0–100.0)
MONO ABS: 0.3 10*3/uL (ref 0.1–1.0)
Monocytes Relative: 3 % (ref 3–12)
Neutro Abs: 9.2 10*3/uL — ABNORMAL HIGH (ref 1.7–7.7)
Neutrophils Relative %: 95 % — ABNORMAL HIGH (ref 43–77)
PLATELETS: 147 10*3/uL — AB (ref 150–400)
RBC: 5.15 MIL/uL — ABNORMAL HIGH (ref 3.87–5.11)
RDW: 12.6 % (ref 11.5–15.5)
WBC: 9.7 10*3/uL (ref 4.0–10.5)

## 2013-08-12 LAB — URINALYSIS, ROUTINE W REFLEX MICROSCOPIC
Bilirubin Urine: NEGATIVE
GLUCOSE, UA: NEGATIVE mg/dL
KETONES UR: NEGATIVE mg/dL
Leukocytes, UA: NEGATIVE
Nitrite: NEGATIVE
PROTEIN: NEGATIVE mg/dL
Specific Gravity, Urine: 1.025 (ref 1.005–1.030)
UROBILINOGEN UA: 0.2 mg/dL (ref 0.0–1.0)
pH: 5.5 (ref 5.0–8.0)

## 2013-08-12 LAB — URINE MICROSCOPIC-ADD ON

## 2013-08-12 LAB — LIPASE, BLOOD: Lipase: 46 U/L (ref 11–59)

## 2013-08-12 LAB — POCT PREGNANCY, URINE: Preg Test, Ur: NEGATIVE

## 2013-08-12 MED ORDER — PROMETHAZINE HCL 25 MG/ML IJ SOLN
25.0000 mg | Freq: Once | INTRAMUSCULAR | Status: AC
  Administered 2013-08-12: 25 mg via INTRAVENOUS
  Filled 2013-08-12: qty 1

## 2013-08-12 MED ORDER — ONDANSETRON 4 MG PO TBDP
4.0000 mg | ORAL_TABLET | Freq: Once | ORAL | Status: AC
  Administered 2013-08-12: 4 mg via ORAL
  Filled 2013-08-12: qty 1

## 2013-08-12 MED ORDER — SODIUM CHLORIDE 0.9 % IV BOLUS (SEPSIS)
1000.0000 mL | Freq: Once | INTRAVENOUS | Status: AC
  Administered 2013-08-12: 1000 mL via INTRAVENOUS

## 2013-08-12 MED ORDER — PROMETHAZINE HCL 25 MG PO TABS: 25.0000 mg | ORAL_TABLET | Freq: Three times a day (TID) | ORAL | Status: DC | PRN

## 2013-08-12 NOTE — ED Notes (Signed)
Sudden onset nausea, vomiting, diarrhea, chills, stomach cramping.  Vomited about 20 times, 2 x diarrhea.

## 2013-08-12 NOTE — ED Provider Notes (Signed)
CSN: [id number]     Arrival date & time 08/12/13  2059 History   This chart was scribed for Elizabeth Muskrat, MD by Era Bumpers, ED scribe. This patient was seen in room APA09/APA09 and the patient's care was started at 2059.  Chief Complaint  Patient presents with  . Emesis   The history is provided by the patient. No language interpreter was used.   HPI Comments: Elizabeth Bullock is a 48 y.o. female who presents to the Emergency Department complaining of emesis episodes every hour since 5 hours ago today, x2 episodes of diarrhea this PM and associated mild cramping abdominal pain. She reports sever chills, denies fever. She reports brief period of CP around noon which resolved spontaneously. She denies any rash swelling, fever, Sob. She took some ASA today.  She takes no medicines Smokes pack and a half per day. She does not drink alcohol.  Past Medical History  Diagnosis Date  . Hypertension   . Bronchitis, chronic   . Headache(784.0)     History of migraines,  takes propanolol for headaches   Past Surgical History  Procedure Laterality Date  . Abdominal hysterectomy    . Ankle surgery      left ankle surgery as teenager per pt  . Cholecystectomy  03/13/2011    Procedure: LAPAROSCOPIC CHOLECYSTECTOMY;  Surgeon: Donato Heinz;  Location: [facility];  Service: General;  Laterality: N/A;   History reviewed. No pertinent family history. History  Substance Use Topics  . Smoking status: Current Every Day Smoker -- 1.00 packs/day    Types: Cigarettes  . Smokeless tobacco: Not on file  . Alcohol Use: No   OB History   Grav Para Term Preterm Abortions TAB SAB Ect Mult Living                 Review of Systems  Constitutional: Positive for chills. Negative for fever.       Per HPI, otherwise negative  HENT:       Per HPI, otherwise negative  Respiratory: Negative for shortness of breath.        Per HPI, otherwise negative  Cardiovascular:       Per HPI, otherwise negative   Gastrointestinal: Positive for nausea, vomiting and diarrhea.  Endocrine:       Negative aside from HPI  Genitourinary:       Neg aside from HPI   Musculoskeletal:       Per HPI, otherwise negative  Skin: Negative.   Neurological: Negative for syncope.  All other systems reviewed and are negative.    Allergies  Codeine; Influenza virus vacc split pf; Morphine and related; and Penicillins  Home Medications   Current Outpatient Rx  Name  Route  Sig  Dispense  Refill  . Aspirin-Salicylamide-Caffeine (BC HEADACHE POWDER PO)   Oral   Take 1 packet by mouth daily as needed (FOR PAIN).         Marland Kitchen HYDROcodone-acetaminophen (NORCO/VICODIN) 5-325 MG per tablet   Oral   Take 1 tablet by mouth every 6 (six) hours as needed for pain.   20 tablet   0   . ibuprofen (ADVIL,MOTRIN) 200 MG tablet   Oral   Take 800 mg by mouth every 8 (eight) hours as needed for pain. Pain         . promethazine (PHENERGAN) 25 MG tablet   Oral   Take 1 tablet (25 mg total) by mouth every 6 (six) hours as needed for nausea.  15 tablet   0   . ranitidine (ZANTAC) 150 MG capsule   Oral   Take 1 capsule (150 mg total) by mouth 2 (two) times daily.   30 capsule   0   . sodium-potassium bicarbonate (ALKA-SELTZER GOLD) TBEF   Oral   Take 1 tablet by mouth daily as needed (for upset stomach).           Triage Vitals: BP 131/83  Pulse 99  Temp(Src) 98.2 F (36.8 C) (Oral)  Resp 18  Ht 5\' 6"  (1.676 m)  Wt 135 lb (61.236 kg)  BMI 21.80 kg/m2  SpO2 100%  Physical Exam  Nursing note and vitals reviewed. Constitutional: She is oriented to person, place, and time. She appears well-developed and well-nourished. No distress.  HENT:  Head: Normocephalic and atraumatic.  Eyes: Conjunctivae and EOM are normal.  Cardiovascular: Normal rate, regular rhythm and normal heart sounds.   Pulmonary/Chest: Effort normal and breath sounds normal. No stridor. No respiratory distress. She has no wheezes.  She has no rales.  Abdominal: Soft. She exhibits no distension. There is tenderness.  Epigastric and RUQ tenderness to palpation  Musculoskeletal: She exhibits no edema.  Neurological: She is alert and oriented to person, place, and time. No cranial nerve deficit.  Skin: Skin is warm and dry.  Psychiatric: She has a normal mood and affect.   ED Course  Procedures (including critical care time) DIAGNOSTIC STUDIES: Oxygen Saturation is 100% on room air, normal by my interpretation.    COORDINATION OF CARE: At 930 PM Discussed treatment plan with patient which includes nausea medicine, blood work. Patient agrees.   Labs Review Labs Reviewed  CBC WITH DIFFERENTIAL - Abnormal; Notable for the following:    RBC 5.15 (*)    Hemoglobin 15.6 (*)    Platelets 147 (*)    Neutrophils Relative % 95 (*)    Neutro Abs 9.2 (*)    Lymphocytes Relative 3 (*)    Lymphs Abs 0.2 (*)    All other components within normal limits  COMPREHENSIVE METABOLIC PANEL - Abnormal; Notable for the following:    Glucose, Bld 122 (*)    All other components within normal limits  URINALYSIS, ROUTINE W REFLEX MICROSCOPIC - Abnormal; Notable for the following:    APPearance CLOUDY (*)    Hgb urine dipstick TRACE (*)    All other components within normal limits  URINE MICROSCOPIC-ADD ON - Abnormal; Notable for the following:    Squamous Epithelial / LPF MANY (*)    Bacteria, UA FEW (*)    All other components within normal limits  LIPASE, BLOOD  POCT PREGNANCY, URINE   11:08 PM Patient asleep.  I reviewed all labs with her and her mother. No no emesis, vital signs remained stable the    MDM  I personally performed the services described in this documentation, which was scribed in my presence. The recorded information has been reviewed and is accurate.    This patient presents with half day of nausea, vomiting, 2 episodes of loose stool.  On exam she is awake, alert, afebrile, hemodynamically stable, with  soft, non-peritoneal abdomen.  With largely reassuring labs, restlessness symptoms, there is low suspicion for occult ongoing systemic illness.  Patient had no chest pain, dyspnea, low suspicion for cardiopulmonary compromise.  The patient's resolution of symptoms she was discharged in stable condition to follow up with primary care.     Elizabeth Muskrat, MD 08/12/13 [phone]

## 2013-08-12 NOTE — Discharge Instructions (Signed)
As discussed, your episode of nausea, vomiting likely do to either an adverse reaction to food, or to inflammation of your stomach.  It is important to get plenty of rest, drink fluids and do not return to a typical diet too quickly.  Please be sure to follow-up with your physician.  Return here for concerning changes in your condition.   Nausea and Vomiting Nausea is a sick feeling that often comes before throwing up (vomiting). Vomiting is a reflex where stomach contents come out of your mouth. Vomiting can cause severe loss of body fluids (dehydration). Children and elderly adults can become dehydrated quickly, especially if they also have diarrhea. Nausea and vomiting are symptoms of a condition or disease. It is important to find the cause of your symptoms. CAUSES   Direct irritation of the stomach lining. This irritation can result from increased acid production (gastroesophageal reflux disease), infection, food poisoning, taking certain medicines (such as nonsteroidal anti-inflammatory drugs), alcohol use, or tobacco use.  Signals from the brain.These signals could be caused by a headache, heat exposure, an inner ear disturbance, increased pressure in the brain from injury, infection, a tumor, or a concussion, pain, emotional stimulus, or metabolic problems.  An obstruction in the gastrointestinal tract (bowel obstruction).  Illnesses such as diabetes, hepatitis, gallbladder problems, appendicitis, kidney problems, cancer, sepsis, atypical symptoms of a heart attack, or eating disorders.  Medical treatments such as chemotherapy and radiation.  Receiving medicine that makes you sleep (general anesthetic) during surgery. DIAGNOSIS Your caregiver may ask for tests to be done if the problems do not improve after a few days. Tests may also be done if symptoms are severe or if the reason for the nausea and vomiting is not clear. Tests may include:  Urine tests.  Blood tests.  Stool  tests.  Cultures (to look for evidence of infection).  X-rays or other imaging studies. Test results can help your caregiver make decisions about treatment or the need for additional tests. TREATMENT You need to stay well hydrated. Drink frequently but in small amounts.You may wish to drink water, sports drinks, clear broth, or eat frozen ice pops or gelatin dessert to help stay hydrated.When you eat, eating slowly may help prevent nausea.There are also some antinausea medicines that may help prevent nausea. HOME CARE INSTRUCTIONS   Take all medicine as directed by your caregiver.  If you do not have an appetite, do not force yourself to eat. However, you must continue to drink fluids.  If you have an appetite, eat a normal diet unless your caregiver tells you differently.  Eat a variety of complex carbohydrates (rice, wheat, potatoes, bread), lean meats, yogurt, fruits, and vegetables.  Avoid high-fat foods because they are more difficult to digest.  Drink enough water and fluids to keep your urine clear or pale yellow.  If you are dehydrated, ask your caregiver for specific rehydration instructions. Signs of dehydration may include:  Severe thirst.  Dry lips and mouth.  Dizziness.  Dark urine.  Decreasing urine frequency and amount.  Confusion.  Rapid breathing or pulse. SEEK IMMEDIATE MEDICAL CARE IF:   You have blood or brown flecks (like coffee grounds) in your vomit.  You have black or bloody stools.  You have a severe headache or stiff neck.  You are confused.  You have severe abdominal pain.  You have chest pain or trouble breathing.  You do not urinate at least once every 8 hours.  You develop cold or clammy skin.  You  continue to vomit for longer than 24 to 48 hours.  You have a fever. MAKE SURE YOU:   Understand these instructions.  Will watch your condition.  Will get help right away if you are not doing well or get worse. Document  Released: 06/16/2005 Document Revised: 09/08/2011 Document Reviewed: 11/13/2010 [facility] Patient Information 2014 [place], [place].

## 2013-08-12 NOTE — ED Notes (Signed)
Patient with no complaints at this time. Respirations even and unlabored. Skin warm/dry. Discharge instructions reviewed with patient at this time. Patient given opportunity to voice concerns/ask questions. IV removed per policy and band-aid applied to site. Patient discharged at this time and left Emergency Department with steady gait.  

## 2013-08-12 NOTE — ED Notes (Signed)
Patient became nauseated during d/c.  Notified Dr. Vanita Panda.  Medicating w/Zofran ODT>

## 2013-12-25 ENCOUNTER — Encounter (HOSPITAL_COMMUNITY): Payer: Self-pay | Admitting: Emergency Medicine

## 2013-12-25 ENCOUNTER — Emergency Department (HOSPITAL_COMMUNITY)
Admission: EM | Admit: 2013-12-25 | Discharge: 2013-12-25 | Disposition: A | Discharge status: Home / Self Care | Payer: Self-pay | Attending: Emergency Medicine | Admitting: Emergency Medicine

## 2013-12-25 DIAGNOSIS — I10 Essential (primary) hypertension: Secondary | ICD-10-CM | POA: Insufficient documentation

## 2013-12-25 DIAGNOSIS — Z79899 Other long term (current) drug therapy: Secondary | ICD-10-CM | POA: Insufficient documentation

## 2013-12-25 DIAGNOSIS — Y929 Unspecified place or not applicable: Secondary | ICD-10-CM | POA: Insufficient documentation

## 2013-12-25 DIAGNOSIS — Z880 Allergy status to penicillin: Secondary | ICD-10-CM | POA: Insufficient documentation

## 2013-12-25 DIAGNOSIS — W57XXXA Bitten or stung by nonvenomous insect and other nonvenomous arthropods, initial encounter: Secondary | ICD-10-CM | POA: Insufficient documentation

## 2013-12-25 DIAGNOSIS — S30860A Insect bite (nonvenomous) of lower back and pelvis, initial encounter: Secondary | ICD-10-CM | POA: Insufficient documentation

## 2013-12-25 DIAGNOSIS — F17200 Nicotine dependence, unspecified, uncomplicated: Secondary | ICD-10-CM | POA: Insufficient documentation

## 2013-12-25 DIAGNOSIS — Z792 Long term (current) use of antibiotics: Secondary | ICD-10-CM | POA: Insufficient documentation

## 2013-12-25 DIAGNOSIS — Y939 Activity, unspecified: Secondary | ICD-10-CM | POA: Insufficient documentation

## 2013-12-25 DIAGNOSIS — Z8709 Personal history of other diseases of the respiratory system: Secondary | ICD-10-CM | POA: Insufficient documentation

## 2013-12-25 MED ORDER — DOXYCYCLINE HYCLATE 100 MG PO CAPS: 100.0000 mg | ORAL_CAPSULE | Freq: Two times a day (BID) | ORAL | Status: DC

## 2013-12-25 MED ORDER — DOXYCYCLINE HYCLATE 100 MG PO TABS
100.0000 mg | ORAL_TABLET | Freq: Once | ORAL | Status: AC
  Administered 2013-12-25: 100 mg via ORAL
  Filled 2013-12-25: qty 1

## 2013-12-25 NOTE — ED Notes (Signed)
PT removed tick this am about an hour ago and has concern she didn't remove the head. PT c/o general malaise for one month.

## 2013-12-25 NOTE — ED Notes (Signed)
MD at bedside. 

## 2013-12-25 NOTE — ED Provider Notes (Signed)
CSN: [id number]     Arrival date & time 12/25/13  5809 History  This chart was scribed for Tanna Furry, MD by Elby Beck, ED Scribe. This patient was seen in room APA03/APA03 and the patient's care was started at 7:59 AM.  Chief Complaint  Patient presents with  . Tick Removal    The history is provided by the patient. No language interpreter was used.    HPI Comments: Elizabeth Bullock is a 48 y.o. female who presents to the Emergency Department complaining of a tick bite to her right groin area. She states that she noticed a tick in her groin area this morning. She reports that she tried to pull the tick off of herself, but that she does not believe she removed the whole tick. She states that her only medication allergy is to Codeine. Uncertain of when tick initial contact occurred. No symptoms.   Past Medical History  Diagnosis Date  . Hypertension   . Bronchitis, chronic   . Headache(784.0)     History of migraines,  takes propanolol for headaches   Past Surgical History  Procedure Laterality Date  . Abdominal hysterectomy    . Ankle surgery      left ankle surgery as teenager per pt  . Cholecystectomy  03/13/2011    Procedure: LAPAROSCOPIC CHOLECYSTECTOMY;  Surgeon: Donato Heinz;  Location: [facility];  Service: General;  Laterality: N/A;   No family history on file. History  Substance Use Topics  . Smoking status: Current Every Day Smoker -- 2.00 packs/day    Types: Cigarettes  . Smokeless tobacco: Not on file  . Alcohol Use: No   OB History   Grav Para Term Preterm Abortions TAB SAB Ect Mult Living                 Review of Systems  Constitutional: Negative for fever and chills.  Respiratory: Negative for shortness of breath.   Cardiovascular: Negative for chest pain.  Gastrointestinal: Negative for nausea, vomiting and abdominal pain.  Skin: Positive for wound (tick bite).    Allergies  Codeine; Influenza virus vacc split pf; Morphine and related; and  Penicillins  Home Medications   Prior to Admission medications   Medication Sig Start Date End Date Taking? Authorizing Provider  Aspirin-Salicylamide-Caffeine (BC HEADACHE POWDER PO) Take 1 packet by mouth daily as needed (FOR PAIN).    Historical Provider, MD  doxycycline (VIBRAMYCIN) 100 MG capsule Take 1 capsule (100 mg total) by mouth 2 (two) times daily. 12/25/13   Tanna Furry, MD  ibuprofen (ADVIL,MOTRIN) 200 MG tablet Take 800 mg by mouth every 8 (eight) hours as needed for pain. Pain    Historical Provider, MD  promethazine (PHENERGAN) 25 MG tablet Take 1 tablet (25 mg total) by mouth every 8 (eight) hours as needed for nausea or vomiting. 08/12/13   Carmin Muskrat, MD   Triage Vitals: BP 129/88  Pulse 76  Temp(Src) 97.8 F (36.6 C) (Oral)  Resp 18  Ht 5\' 6"  (1.676 m)  Wt 128 lb (58.06 kg)  BMI 20.67 kg/m2  SpO2 98%  Physical Exam  Nursing note and vitals reviewed. Constitutional: She is oriented to person, place, and time. She appears well-developed and well-nourished.  HENT:  Head: Normocephalic and atraumatic.  Eyes: Conjunctivae are normal.  Cardiovascular: Normal rate.   Pulmonary/Chest: Effort normal. No respiratory distress.  Genitourinary:  Right groin: 1 cm diamater area of erythema. Tick parts visible in the center.   Neurological: She is  alert and oriented to person, place, and time.  Skin: Skin is warm and dry.    ED Course  Procedures (including critical care time)  Tick removal:  Area of tick bite cleaned and draped.  Anesthetized with 1cc 1% Lidocaine with epi.  Bite unroofed and all visualized tick parts removed.  Dressed with dressing.  No complications.  DIAGNOSTIC STUDIES: Oxygen Saturation is 98% on RA, normal by my interpretation.    COORDINATION OF CARE: 8:04 AM- Discussed plan to remove visible tick parts. Pt advised of plan for treatment and pt agrees.  Labs Review Labs Reviewed - No data to display  Imaging Review No results found.    EKG Interpretation None      MDM   Final diagnoses:  Tick bite with subsequent removal of tick    DC wound care. Doxycycline x 5 days.   I personally performed the services described in this documentation, which was scribed in my presence. The recorded information has been reviewed and is accurate.   Tanna Furry, MD 12/25/13 [phone]

## 2013-12-25 NOTE — Discharge Instructions (Signed)
Keep wound clean and dry.  Apply antibiotic ointment daily, and cover with a band-aid.

## 2014-01-26 ENCOUNTER — Emergency Department (HOSPITAL_COMMUNITY): Payer: Self-pay

## 2014-01-26 ENCOUNTER — Emergency Department (HOSPITAL_COMMUNITY)
Admission: EM | Admit: 2014-01-26 | Discharge: 2014-01-26 | Disposition: A | Discharge status: Home / Self Care | Payer: Self-pay | Attending: Emergency Medicine | Admitting: Emergency Medicine

## 2014-01-26 ENCOUNTER — Encounter (HOSPITAL_COMMUNITY): Payer: Self-pay | Admitting: Emergency Medicine

## 2014-01-26 DIAGNOSIS — Z880 Allergy status to penicillin: Secondary | ICD-10-CM | POA: Insufficient documentation

## 2014-01-26 DIAGNOSIS — M51379 Other intervertebral disc degeneration, lumbosacral region without mention of lumbar back pain or lower extremity pain: Secondary | ICD-10-CM | POA: Insufficient documentation

## 2014-01-26 DIAGNOSIS — I10 Essential (primary) hypertension: Secondary | ICD-10-CM | POA: Insufficient documentation

## 2014-01-26 DIAGNOSIS — M5136 Other intervertebral disc degeneration, lumbar region: Secondary | ICD-10-CM

## 2014-01-26 DIAGNOSIS — Z8709 Personal history of other diseases of the respiratory system: Secondary | ICD-10-CM | POA: Insufficient documentation

## 2014-01-26 DIAGNOSIS — M545 Low back pain, unspecified: Secondary | ICD-10-CM | POA: Insufficient documentation

## 2014-01-26 DIAGNOSIS — R51 Headache: Secondary | ICD-10-CM | POA: Insufficient documentation

## 2014-01-26 DIAGNOSIS — F17200 Nicotine dependence, unspecified, uncomplicated: Secondary | ICD-10-CM | POA: Insufficient documentation

## 2014-01-26 DIAGNOSIS — M5137 Other intervertebral disc degeneration, lumbosacral region: Secondary | ICD-10-CM | POA: Insufficient documentation

## 2014-01-26 DIAGNOSIS — M160 Bilateral primary osteoarthritis of hip: Secondary | ICD-10-CM

## 2014-01-26 DIAGNOSIS — Z5189 Encounter for other specified aftercare: Secondary | ICD-10-CM | POA: Insufficient documentation

## 2014-01-26 DIAGNOSIS — Z79899 Other long term (current) drug therapy: Secondary | ICD-10-CM | POA: Insufficient documentation

## 2014-01-26 MED ORDER — DICLOFENAC SODIUM 75 MG PO TBEC: 75.0000 mg | DELAYED_RELEASE_TABLET | Freq: Two times a day (BID) | ORAL | Status: DC

## 2014-01-26 MED ORDER — ONDANSETRON HCL 4 MG PO TABS
4.0000 mg | ORAL_TABLET | Freq: Once | ORAL | Status: AC
  Administered 2014-01-26: 4 mg via ORAL
  Filled 2014-01-26: qty 1

## 2014-01-26 MED ORDER — HYDROCODONE-ACETAMINOPHEN 5-325 MG PO TABS: 1.0000 | ORAL_TABLET | ORAL | Status: DC | PRN

## 2014-01-26 MED ORDER — KETOROLAC TROMETHAMINE 10 MG PO TABS: 10.0000 mg | ORAL_TABLET | Freq: Once | ORAL | Status: DC

## 2014-01-26 MED ORDER — METHOCARBAMOL 500 MG PO TABS
1000.0000 mg | ORAL_TABLET | Freq: Once | ORAL | Status: AC
  Administered 2014-01-26: 1000 mg via ORAL
  Filled 2014-01-26: qty 2

## 2014-01-26 MED ORDER — HYDROCODONE-ACETAMINOPHEN 5-325 MG PO TABS
1.0000 | ORAL_TABLET | Freq: Once | ORAL | Status: AC
  Administered 2014-01-26: 1 via ORAL
  Filled 2014-01-26: qty 1

## 2014-01-26 NOTE — ED Notes (Signed)
NAD noted at time of d/c home with family 

## 2014-01-26 NOTE — Discharge Instructions (Signed)
Degenerative Disk Disease Degenerative disk disease is a condition caused by the changes that occur in the cushions of the backbone (spinal disks) as you grow older. Spinal disks are soft and compressible disks located between the bones of the spine (vertebrae). They act like shock absorbers. Degenerative disk disease can affect the whole spine. However, the neck and lower back are most commonly affected. Many changes can occur in the spinal disks with aging, such as:  The spinal disks may dry and shrink.  Small tears may occur in the tough, outer covering of the disk (annulus).  The disk space may become smaller due to loss of water.  Abnormal growths in the bone (spurs) may occur. This can put pressure on the nerve roots exiting the spinal canal, causing pain.  The spinal canal may become narrowed. CAUSES  Degenerative disk disease is a condition caused by the changes that occur in the spinal disks with aging. The exact cause is not known, but there is a genetic basis for many patients. Degenerative changes can occur due to loss of fluid in the disk. This makes the disk thinner and reduces the space between the backbones. Small cracks can develop in the outer layer of the disk. This can lead to the breakdown of the disk. You are more likely to get degenerative disk disease if you are overweight. Smoking cigarettes and doing heavy work such as weightlifting can also increase your risk of this condition. Degenerative changes can start after a sudden injury. Growth of bone spurs can compress the nerve roots and cause pain.  SYMPTOMS  The symptoms vary from person to person. Some people may have no pain, while others have severe pain. The pain may be so severe that it can limit your activities. The location of the pain depends on the part of your backbone that is affected. You will have neck or arm pain if a disk in the neck area is affected. You will have pain in your back, buttocks, or legs if a disk  in the lower back is affected. The pain becomes worse while bending, reaching up, or with twisting movements. The pain may start gradually and then get worse as time passes. It may also start after a major or minor injury. You may feel numbness or tingling in the arms or legs.  DIAGNOSIS  Your caregiver will ask you about your symptoms and about activities or habits that may cause the pain. He or she may also ask about any injuries, diseases, or treatments you have had earlier. Your caregiver will examine you to check for the range of movement that is possible in the affected area, to check for strength in your extremities, and to check for sensation in the areas of the arms and legs supplied by different nerve roots. An X-ray of the spine may be taken. Your caregiver may suggest other imaging tests, such as magnetic resonance imaging (MRI), if needed.  TREATMENT  Treatment includes rest, modifying your activities, and applying ice and heat. Your caregiver may prescribe medicines to reduce your pain and may ask you to do some exercises to strengthen your back. In some cases, you may need surgery. You and your caregiver will decide on the treatment that is best for you. HOME CARE INSTRUCTIONS   Follow proper lifting and walking techniques as advised by your caregiver.  Maintain good posture.  Exercise regularly as advised.  Perform relaxation exercises.  Change your sitting, standing, and sleeping habits as advised. Change positions  frequently.  Lose weight as advised.  Stop smoking if you smoke.  Wear supportive footwear. SEEK MEDICAL CARE IF:  Your pain does not go away within 1 to 4 weeks. SEEK IMMEDIATE MEDICAL CARE IF:   Your pain is severe.  You notice weakness in your arms, hands, or legs.  You begin to lose control of your bladder or bowel movements. MAKE SURE YOU:   Understand these instructions.  Will watch your condition.  Will get help right away if you are not doing  well or get worse. Document Released: 04/13/2007 Document Revised: 09/08/2011 Document Reviewed: 10/18/2013 [facility] Patient Information 2015 [place], [place]. This information is not intended to replace advice given to you by your health care provider. Make sure you discuss any questions you have with your health care provider.  Arthritis, Nonspecific Arthritis is inflammation of a joint. This usually means pain, redness, warmth or swelling are present. One or more joints may be involved. There are a number of types of arthritis. Your caregiver may not be able to tell what type of arthritis you have right away. CAUSES  The most common cause of arthritis is the wear and tear on the joint (osteoarthritis). This causes damage to the cartilage, which can break down over time. The knees, hips, back and neck are most often affected by this type of arthritis. Other types of arthritis and common causes of joint pain include:  Sprains and other injuries near the joint. Sometimes minor sprains and injuries cause pain and swelling that develop hours later.  Rheumatoid arthritis. This affects hands, feet and knees. It usually affects both sides of your body at the same time. It is often associated with chronic ailments, fever, weight loss and general weakness.  Crystal arthritis. Gout and pseudo gout can cause occasional acute severe pain, redness and swelling in the foot, ankle, or knee.  Infectious arthritis. Bacteria can get into a joint through a break in overlying skin. This can cause infection of the joint. Bacteria and viruses can also spread through the blood and affect your joints.  Drug, infectious and allergy reactions. Sometimes joints can become mildly painful and slightly swollen with these types of illnesses. SYMPTOMS   Pain is the main symptom.  Your joint or joints can also be red, swollen and warm or hot to the touch.  You may have a fever with certain types of arthritis, or even feel  overall ill.  The joint with arthritis will hurt with movement. Stiffness is present with some types of arthritis. DIAGNOSIS  Your caregiver will suspect arthritis based on your description of your symptoms and on your exam. Testing may be needed to find the type of arthritis:  Blood and sometimes urine tests.  X-ray tests and sometimes CT or MRI scans.  Removal of fluid from the joint (arthrocentesis) is done to check for bacteria, crystals or other causes. Your caregiver (or a specialist) will numb the area over the joint with a local anesthetic, and use a needle to remove joint fluid for examination. This procedure is only minimally uncomfortable.  Even with these tests, your caregiver may not be able to tell what kind of arthritis you have. Consultation with a specialist (rheumatologist) may be helpful. TREATMENT  Your caregiver will discuss with you treatment specific to your type of arthritis. If the specific type cannot be determined, then the following general recommendations may apply. Treatment of severe joint pain includes:  Rest.  Elevation.  Anti-inflammatory medication (for example, ibuprofen) may  be prescribed. Avoiding activities that cause increased pain.  Only take over-the-counter or prescription medicines for pain and discomfort as recommended by your caregiver.  Cold packs over an inflamed joint may be used for 10 to 15 minutes every hour. Hot packs sometimes feel better, but do not use overnight. Do not use hot packs if you are diabetic without your caregiver's permission.  A cortisone shot into arthritic joints may help reduce pain and swelling.  Any acute arthritis that gets worse over the next 1 to 2 days needs to be looked at to be sure there is no joint infection. Long-term arthritis treatment involves modifying activities and lifestyle to reduce joint stress jarring. This can include weight loss. Also, exercise is needed to nourish the joint cartilage and  remove waste. This helps keep the muscles around the joint strong. HOME CARE INSTRUCTIONS   Do not take aspirin to relieve pain if gout is suspected. This elevates uric acid levels.  Only take over-the-counter or prescription medicines for pain, discomfort or fever as directed by your caregiver.  Rest the joint as much as possible.  If your joint is swollen, keep it elevated.  Use crutches if the painful joint is in your leg.  Drinking plenty of fluids may help for certain types of arthritis.  Follow your caregiver's dietary instructions.  Try low-impact exercise such as:  Swimming.  Water aerobics.  Biking.  Walking.  Morning stiffness is often relieved by a warm shower.  Put your joints through regular range-of-motion. SEEK MEDICAL CARE IF:   You do not feel better in 24 hours or are getting worse.  You have side effects to medications, or are not getting better with treatment. SEEK IMMEDIATE MEDICAL CARE IF:   You have a fever.  You develop severe joint pain, swelling or redness.  Many joints are involved and become painful and swollen.  There is severe back pain and/or leg weakness.  You have loss of bowel or bladder control. Document Released: 07/24/2004 Document Revised: 09/08/2011 Document Reviewed: 08/09/2008 [facility] Patient Information 2015 [place], [place]. This information is not intended to replace advice given to you by your health care provider. Make sure you discuss any questions you have with your health care provider.

## 2014-01-26 NOTE — ED Provider Notes (Signed)
CSN: [id number]     Arrival date & time 01/26/14  2595 History   First MD Initiated Contact with Patient 01/26/14 [phone]     Chief Complaint  Patient presents with  . Back Pain     (Consider location/radiation/quality/duration/timing/severity/associated sxs/prior Treatment) HPI Comments: Pt is a 48 y/o female who presents to the ED with c/o low back pain that radiated down both legs. This began 2 weeks ago and now the toes feel "numb". Pt reports that she has back pain from time to time and hip/pelvis pain frequently. Pt is worse for the past 2 weeks, now toes are numb at times. No falls. No loss of bowel or bladder function. Pt has tried rest and tylenol without improvement. 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  Patient is a 48 y.o. female presenting with back pain. The history is provided by the patient.  Back Pain Associated symptoms: headaches   Associated symptoms: no abdominal pain, no chest pain and no dysuria     Past Medical History  Diagnosis Date  . Hypertension   . Bronchitis, chronic   . Headache(784.0)     History of migraines,  takes propanolol for headaches   Past Surgical History  Procedure Laterality Date  . Abdominal hysterectomy    . Ankle surgery      left ankle surgery as teenager per pt  . Cholecystectomy  03/13/2011    Procedure: LAPAROSCOPIC CHOLECYSTECTOMY;  Surgeon: Donato Heinz;  Location: [facility];  Service: General;  Laterality: N/A;   History reviewed. No pertinent family history. History  Substance Use Topics  . Smoking status: Current Every Day Smoker -- 2.00 packs/day    Types: Cigarettes  . Smokeless tobacco: Not on file  . Alcohol Use: No   OB History   Grav Para Term Preterm Abortions TAB SAB Ect Mult Living                 Review of Systems  Constitutional: Negative for activity change.       All ROS Neg except as noted in HPI  Eyes: Negative for  photophobia and discharge.  Respiratory: Negative for cough, shortness of breath and wheezing.   Cardiovascular: Negative for chest pain and palpitations.  Gastrointestinal: Negative for abdominal pain and blood in stool.  Genitourinary: Negative for dysuria, frequency and hematuria.  Musculoskeletal: Positive for arthralgias and back pain. Negative for neck pain.  Skin: Negative.   Neurological: Positive for headaches. Negative for dizziness, seizures and speech difficulty.  Psychiatric/Behavioral: Negative for hallucinations and confusion.      Allergies  Codeine; Influenza virus vacc split pf; Morphine and related; and Penicillins  Home Medications   Prior to Admission medications   Medication Sig Start Date End Date Taking? Authorizing Provider  acetaminophen (TYLENOL) 650 MG CR tablet Take 1,300 mg by mouth daily as needed for pain.   Yes Historical Provider, MD  Aspirin-Salicylamide-Caffeine (BC HEADACHE POWDER PO) Take 1 packet by mouth daily as needed (FOR PAIN).   Yes Historical Provider, MD   BP 146/81  Pulse 65  Temp(Src) 97.9 F (36.6 C) (Oral)  Resp 18  SpO2 97% Physical Exam  Nursing note and vitals reviewed. Constitutional: She is oriented to person, place, and time. She appears well-developed and well-nourished.  Non-toxic appearance.  HENT:  Head: Normocephalic.  Right Ear: Tympanic membrane and external ear normal.  Left Ear:  Tympanic membrane and external ear normal.  Eyes: EOM and lids are normal. Pupils are equal, round, and reactive to light.  Neck: Normal range of motion. Neck supple. Carotid bruit is not present.  Cardiovascular: Normal rate, regular rhythm, normal heart sounds, intact distal pulses and normal pulses.   Pulmonary/Chest: Breath sounds normal. No respiratory distress.  Abdominal: Soft. Bowel sounds are normal. There is no tenderness. There is no guarding.  No mass or pulsating mass  Musculoskeletal:       Right hip: She exhibits  decreased range of motion and tenderness. She exhibits no deformity.       Left hip: She exhibits decreased range of motion, tenderness and deformity.       Lumbar back: She exhibits decreased range of motion, tenderness and pain.  Lymphadenopathy:       Head (right side): No submandibular adenopathy present.       Head (left side): No submandibular adenopathy present.    She has no cervical adenopathy.  Neurological: She is alert and oriented to person, place, and time. She has normal strength. No cranial nerve deficit or sensory deficit.  Skin: Skin is warm and dry.  Psychiatric: She has a normal mood and affect. Her speech is normal.    ED Course  Procedures (including critical care time) Labs Review Labs Reviewed - No data to display  Imaging Review No results found.   EKG Interpretation None      MDM Vital signs are well within normal limits. CT L spine reveals disc bulging at [facility] and L3L4 with mild canal narrowing. There is mild central disc protrusion at L4, but no canal narrowing. Test results given to the patient. Pt reassured that she does not have hips out of place. Pt strongly encouraged to [facility] until she can establish a primary MD. Rx for norco and voltaren given to the patient.   Final diagnoses:  None    *I have reviewed nursing notes, vital signs, and all appropriate lab and imaging results for this patient.Lenox Ahr, PA-C 01/26/14 [id number]  Lenox Ahr, PA-C 01/26/14 [phone]

## 2014-01-26 NOTE — ED Notes (Signed)
Pt c/o lower back pain that radiates down both legs. Pt states pain began 2 weeks ago but became worse yesterday. Pt ambulatory.

## 2014-01-27 MED ORDER — IBUPROFEN 600 MG PO TABS: 600.0000 mg | ORAL_TABLET | Freq: Three times a day (TID) | ORAL | Status: DC

## 2014-01-28 NOTE — ED Provider Notes (Signed)
Medical screening examination/treatment/procedure(s) were performed by non-physician practitioner and as supervising physician I was immediately available for consultation/collaboration.   EKG Interpretation None        Alfonzo Feller, DO 01/28/14 [phone]

## 2014-02-12 ENCOUNTER — Emergency Department (HOSPITAL_COMMUNITY)
Admission: EM | Admit: 2014-02-12 | Discharge: 2014-02-12 | Disposition: A | Discharge status: Home / Self Care | Payer: Self-pay | Attending: Emergency Medicine | Admitting: Emergency Medicine

## 2014-02-12 ENCOUNTER — Encounter (HOSPITAL_COMMUNITY): Payer: Self-pay | Admitting: Emergency Medicine

## 2014-02-12 DIAGNOSIS — Z880 Allergy status to penicillin: Secondary | ICD-10-CM | POA: Insufficient documentation

## 2014-02-12 DIAGNOSIS — R42 Dizziness and giddiness: Secondary | ICD-10-CM | POA: Insufficient documentation

## 2014-02-12 DIAGNOSIS — F17200 Nicotine dependence, unspecified, uncomplicated: Secondary | ICD-10-CM | POA: Insufficient documentation

## 2014-02-12 DIAGNOSIS — Z8709 Personal history of other diseases of the respiratory system: Secondary | ICD-10-CM | POA: Insufficient documentation

## 2014-02-12 DIAGNOSIS — I10 Essential (primary) hypertension: Secondary | ICD-10-CM | POA: Insufficient documentation

## 2014-02-12 MED ORDER — MECLIZINE HCL 12.5 MG PO TABS
25.0000 mg | ORAL_TABLET | Freq: Once | ORAL | Status: AC
  Administered 2014-02-12: 25 mg via ORAL
  Filled 2014-02-12: qty 2

## 2014-02-12 MED ORDER — MECLIZINE HCL 25 MG PO TABS: 12.5000 mg | ORAL_TABLET | Freq: Three times a day (TID) | ORAL | Status: DC | PRN

## 2014-02-12 MED ORDER — PROMETHAZINE HCL 12.5 MG PO TABS
25.0000 mg | ORAL_TABLET | Freq: Once | ORAL | Status: AC
  Administered 2014-02-12: 25 mg via ORAL
  Filled 2014-02-12: qty 2

## 2014-02-12 NOTE — ED Notes (Signed)
C/o right ear ringing and HA, states "fighting a cold since June 9"

## 2014-02-12 NOTE — Discharge Instructions (Signed)
Benign Positional Vertigo Vertigo means you feel like you or your surroundings are moving when they are not. Benign positional vertigo is the most common form of vertigo. Benign means that the cause of your condition is not serious. Benign positional vertigo is more common in older adults. CAUSES  Benign positional vertigo is the result of an upset in the labyrinth system. This is an area in the middle ear that helps control your balance. This may be caused by a viral infection, head injury, or repetitive motion. However, often no specific cause is found. SYMPTOMS  Symptoms of benign positional vertigo occur when you move your head or eyes in different directions. Some of the symptoms may include:  Loss of balance and falls.  Vomiting.  Blurred vision.  Dizziness.  Nausea.  Involuntary eye movements (nystagmus). DIAGNOSIS  Benign positional vertigo is usually diagnosed by physical exam. If the specific cause of your benign positional vertigo is unknown, your caregiver may perform imaging tests, such as magnetic resonance imaging (MRI) or computed tomography (CT). TREATMENT  Your caregiver may recommend movements or procedures to correct the benign positional vertigo. Medicines such as meclizine, benzodiazepines, and medicines for nausea may be used to treat your symptoms. In rare cases, if your symptoms are caused by certain conditions that affect the inner ear, you may need surgery. HOME CARE INSTRUCTIONS   Follow your caregiver's instructions.  Move slowly. Do not make sudden body or head movements.  Avoid driving.  Avoid operating heavy machinery.  Avoid performing any tasks that would be dangerous to you or others during a vertigo episode.  Drink enough fluids to keep your urine clear or pale yellow. SEEK IMMEDIATE MEDICAL CARE IF:   You develop problems with walking, weakness, numbness, or using your arms, hands, or legs.  You have difficulty speaking.  You develop  severe headaches.  Your nausea or vomiting continues or gets worse.  You develop visual changes.  Your family or friends notice any behavioral changes.  Your condition gets worse.  You have a fever.  You develop a stiff neck or sensitivity to light. MAKE SURE YOU:   Understand these instructions.  Will watch your condition.  Will get help right away if you are not doing well or get worse. Document Released: 03/24/2006 Document Revised: 09/08/2011 Document Reviewed: 03/06/2011 [employer] Patient Information 2015 [employer]. This information is not intended to replace advice given to you by your health care provider. Make sure you discuss any questions you have with your health care provider.    

## 2014-02-12 NOTE — ED Notes (Signed)
Pt c/o vertigo since yesterday, states hx of same

## 2014-02-20 NOTE — ED Provider Notes (Signed)
CSN: [id number]     Arrival date & time 02/12/14  2030 History   First MD Initiated Contact with Patient 02/12/14 2035     Chief Complaint  Patient presents with  . Dizziness     (Consider location/radiation/quality/duration/timing/severity/associated sxs/prior Treatment) HPI  48 year old female presenting with what she calls dizziness. She actually describes vertigo though. Abnormal sensation of things moving. Worse with movement. Improved with rest. Ringing in her right ear. Right ear fullness and mild pain. No drainage. Denies any headache or neck pain. No fevers or chills. No acute visual complaints. No acute numbness, tingling or loss of strength. No fevers or chills, but she feels like she has had a "cold" for the past 2 months. She describes facial stuffiness and occasional cough. When I mentioned the term vertigo, she states that she has had this previously.  Past Medical History  Diagnosis Date  . Hypertension   . Bronchitis, chronic   . Headache(784.0)     History of migraines,  takes propanolol for headaches   Past Surgical History  Procedure Laterality Date  . Abdominal hysterectomy    . Ankle surgery      left ankle surgery as teenager per pt  . Cholecystectomy  03/13/2011    Procedure: LAPAROSCOPIC CHOLECYSTECTOMY;  Surgeon: Donato Heinz;  Location: [facility];  Service: General;  Laterality: N/A;   History reviewed. No pertinent family history. History  Substance Use Topics  . Smoking status: Current Every Day Smoker -- 2.00 packs/day    Types: Cigarettes  . Smokeless tobacco: Not on file  . Alcohol Use: No   OB History   Grav Para Term Preterm Abortions TAB SAB Ect Mult Living                 Review of Systems  All systems reviewed and negative, other than as noted in HPI.   Allergies  Codeine; Influenza virus vacc split pf; Morphine and related; and Penicillins  Home Medications   Prior to Admission medications   Medication Sig Start Date End Date  Taking? Authorizing Provider  acetaminophen (TYLENOL) 650 MG CR tablet Take 1,300 mg by mouth daily as needed for pain.   Yes Historical Provider, MD  Aspirin-Salicylamide-Caffeine (BC HEADACHE POWDER PO) Take 1 packet by mouth daily as needed (FOR PAIN).   Yes Historical Provider, MD  meclizine (ANTIVERT) 25 MG tablet Take 0.5-1 tablets (12.5-25 mg total) by mouth 3 (three) times daily as needed (vertigo). 02/12/14   Virgel Manifold, MD   BP 147/102  Pulse 91  Temp(Src) 98.4 F (36.9 C) (Oral)  Resp 20  Ht 5\' 4"  (1.626 m)  Wt 128 lb (58.06 kg)  BMI 21.96 kg/m2  SpO2 100% Physical Exam  Nursing note and vitals reviewed. Constitutional: She is oriented to person, place, and time. She appears well-developed and well-nourished. No distress.  HENT:  Head: Normocephalic and atraumatic.  Right Ear: External ear normal.  Left Ear: External ear normal.  Right and left ear normal. No periauricular skin changes. No mastoid tenderness. External ear is normal in appearance. Tympanic membranes are clear bilaterally. External auditory canals are clear as well. I cannot elicit any nystagmus with Marye Round maneuvers, the patient was complaining of subjective increased symptoms.  Eyes: Conjunctivae and EOM are normal. Pupils are equal, round, and reactive to light. Right eye exhibits no discharge. Left eye exhibits no discharge.  Neck: Neck supple.  Cardiovascular: Normal rate, regular rhythm and normal heart sounds.  Exam reveals no gallop  and no friction rub.   No murmur heard. Pulmonary/Chest: Effort normal and breath sounds normal. No respiratory distress.  Abdominal: Soft. She exhibits no distension. There is no tenderness.  Musculoskeletal: She exhibits no edema and no tenderness.  Neurological: She is alert and oriented to person, place, and time. No cranial nerve deficit. She exhibits normal muscle tone. Coordination normal.  Speech clear. Content appropriate. Follows commands. Gait is steady.   Skin: Skin is warm and dry.  Psychiatric: She has a normal mood and affect. Her behavior is normal. Thought content normal.    ED Course  Procedures (including critical care time) Labs Review Labs Reviewed - No data to display  Imaging Review No results found.   EKG Interpretation None      MDM   Final diagnoses:  Vertigo    48 year old female with vertigo. Likely peripheral etiology. She describes tinnitus in her right ear. Her HEENT exam is unremarkable. Neurological examination is nonfocal. I doubt a central cause. Plan symptomatic treatment with meclizine. Return precautions were discussed.    Virgel Manifold, MD 02/20/14 [phone]

## 2014-05-27 ENCOUNTER — Encounter (HOSPITAL_COMMUNITY): Payer: Self-pay | Admitting: *Deleted

## 2014-05-27 ENCOUNTER — Emergency Department (HOSPITAL_COMMUNITY): Payer: Self-pay

## 2014-05-27 ENCOUNTER — Emergency Department (HOSPITAL_COMMUNITY)
Admission: EM | Admit: 2014-05-27 | Discharge: 2014-05-27 | Disposition: A | Discharge status: Home / Self Care | Payer: Self-pay | Attending: Emergency Medicine | Admitting: Emergency Medicine

## 2014-05-27 DIAGNOSIS — Z880 Allergy status to penicillin: Secondary | ICD-10-CM | POA: Insufficient documentation

## 2014-05-27 DIAGNOSIS — I10 Essential (primary) hypertension: Secondary | ICD-10-CM | POA: Insufficient documentation

## 2014-05-27 DIAGNOSIS — M549 Dorsalgia, unspecified: Secondary | ICD-10-CM

## 2014-05-27 DIAGNOSIS — M545 Low back pain: Secondary | ICD-10-CM | POA: Insufficient documentation

## 2014-05-27 DIAGNOSIS — Z720 Tobacco use: Secondary | ICD-10-CM | POA: Insufficient documentation

## 2014-05-27 LAB — URINALYSIS, ROUTINE W REFLEX MICROSCOPIC
Bilirubin Urine: NEGATIVE
GLUCOSE, UA: NEGATIVE mg/dL
Hgb urine dipstick: NEGATIVE
Ketones, ur: NEGATIVE mg/dL
LEUKOCYTES UA: NEGATIVE
Nitrite: NEGATIVE
PROTEIN: NEGATIVE mg/dL
UROBILINOGEN UA: 0.2 mg/dL (ref 0.0–1.0)
pH: 6 (ref 5.0–8.0)

## 2014-05-27 LAB — CBC WITH DIFFERENTIAL/PLATELET
Basophils Absolute: 0 10*3/uL (ref 0.0–0.1)
Basophils Relative: 0 % (ref 0–1)
EOS ABS: 0.2 10*3/uL (ref 0.0–0.7)
Eosinophils Relative: 3 % (ref 0–5)
HCT: 43.4 % (ref 36.0–46.0)
Hemoglobin: 15.4 g/dL — ABNORMAL HIGH (ref 12.0–15.0)
Lymphocytes Relative: 31 % (ref 12–46)
Lymphs Abs: 2.1 10*3/uL (ref 0.7–4.0)
MCH: 30.3 pg (ref 26.0–34.0)
MCHC: 35.5 g/dL (ref 30.0–36.0)
MCV: 85.3 fL (ref 78.0–100.0)
Monocytes Absolute: 0.5 10*3/uL (ref 0.1–1.0)
Monocytes Relative: 7 % (ref 3–12)
Neutro Abs: 3.9 10*3/uL (ref 1.7–7.7)
Neutrophils Relative %: 59 % (ref 43–77)
PLATELETS: 231 10*3/uL (ref 150–400)
RBC: 5.09 MIL/uL (ref 3.87–5.11)
RDW: 13 % (ref 11.5–15.5)
WBC: 6.8 10*3/uL (ref 4.0–10.5)

## 2014-05-27 LAB — COMPREHENSIVE METABOLIC PANEL
ALT: 9 U/L (ref 0–35)
ANION GAP: 13 (ref 5–15)
AST: 13 U/L (ref 0–37)
Albumin: 3.8 g/dL (ref 3.5–5.2)
Alkaline Phosphatase: 74 U/L (ref 39–117)
BUN: 5 mg/dL — AB (ref 6–23)
CALCIUM: 9.1 mg/dL (ref 8.4–10.5)
CO2: 25 meq/L (ref 19–32)
CREATININE: 0.72 mg/dL (ref 0.50–1.10)
Chloride: 101 mEq/L (ref 96–112)
GFR calc Af Amer: >90 mL/min (ref >90)
Glucose, Bld: 87 mg/dL (ref 70–99)
Potassium: 3.6 mEq/L — ABNORMAL LOW (ref 3.7–5.3)
Sodium: 139 mEq/L (ref 137–147)
TOTAL PROTEIN: 7 g/dL (ref 6.0–8.3)
Total Bilirubin: 0.6 mg/dL (ref 0.3–1.2)

## 2014-05-27 MED ORDER — HYDROCODONE-ACETAMINOPHEN 5-325 MG PO TABS: 1.0000 | ORAL_TABLET | ORAL | Status: DC | PRN

## 2014-05-27 MED ORDER — IOHEXOL 300 MG/ML  SOLN
50.0000 mL | Freq: Once | INTRAMUSCULAR | Status: AC | PRN
  Administered 2014-05-27: 50 mL via INTRAVENOUS

## 2014-05-27 MED ORDER — IOHEXOL 300 MG/ML  SOLN
100.0000 mL | Freq: Once | INTRAMUSCULAR | Status: AC | PRN
  Administered 2014-05-27: 100 mL via INTRAVENOUS

## 2014-05-27 MED ORDER — CYCLOBENZAPRINE HCL 10 MG PO TABS: 10.0000 mg | ORAL_TABLET | Freq: Three times a day (TID) | ORAL | Status: DC | PRN

## 2014-05-27 MED ORDER — MORPHINE SULFATE 4 MG/ML IJ SOLN
4.0000 mg | Freq: Once | INTRAMUSCULAR | Status: AC
  Administered 2014-05-27: 4 mg via INTRAVENOUS
  Filled 2014-05-27: qty 1

## 2014-05-27 MED ORDER — ONDANSETRON HCL 4 MG/2ML IJ SOLN
4.0000 mg | Freq: Once | INTRAMUSCULAR | Status: AC
  Administered 2014-05-27: 4 mg via INTRAVENOUS
  Filled 2014-05-27: qty 2

## 2014-05-27 NOTE — ED Notes (Addendum)
Pt reporting pain in lower back, radiating into hips and legs.  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

## 2014-05-27 NOTE — ED Provider Notes (Signed)
CSN: [id number]     Arrival date & time 05/27/14  0212 History   First MD Initiated Contact with Patient 05/27/14 [id number]     Chief Complaint: Burning in lower back   (Consider location/radiation/quality/duration/timing/severity/associated sxs/prior Treatment) The history is provided by the patient.   48 year old female has noted a burning pain in her lower back for about the last week. Over the last 2 days, she has noticed a discoloration in her lower back which extended down into the buttocks and thighs. She has had some numbness in her feet. She denies any weakness. She has noticed that she is urinating a lot but has not had any dysuria and has not had any urinary incontinence or fecal incontinence. She denies any recent trauma or unusual activity but does state that she is on her feet for several hours a day. She has taken acetaminophen without any relief. She rates her discomfort at 8/10. Discomfort is similar to discomfort she had during the summer when she was seen in the ED and diagnosed with degenerative disc disease.  Past Medical History  Diagnosis Date  . Hypertension   . Bronchitis, chronic   . Headache(784.0)     History of migraines,  takes propanolol for headaches   Past Surgical History  Procedure Laterality Date  . Abdominal hysterectomy    . Ankle surgery      left ankle surgery as teenager per pt  . Cholecystectomy  03/13/2011    Procedure: LAPAROSCOPIC CHOLECYSTECTOMY;  Surgeon: Donato Heinz;  Location: [facility];  Service: General;  Laterality: N/A;   History reviewed. No pertinent family history. History  Substance Use Topics  . Smoking status: Current Every Day Smoker -- 2.00 packs/day    Types: Cigarettes  . Smokeless tobacco: Not on file  . Alcohol Use: No   OB History    No data available     Review of Systems  All other systems reviewed and are negative.     Allergies  Codeine; Influenza virus vacc split pf; Morphine and related; and  Penicillins  Home Medications   Prior to Admission medications   Not on File   BP 142/85 mmHg  Pulse 70  Temp(Src) 98.7 F (37.1 C)  Resp 20  Ht 5' 3.5" (1.613 m)  Wt 135 lb (61.236 kg)  BMI 23.54 kg/m2  SpO2 100% Physical Exam  Nursing note and vitals reviewed.  48 year old female, resting comfortably and in no acute distress. Vital signs are significant for borderline hypertension. Oxygen saturation is 100%, which is normal. Head is normocephalic and atraumatic. PERRLA, EOMI. Oropharynx is clear. Neck is nontender and supple without adenopathy or JVD. Back is nontender and there is no CVA tenderness. Very mild mottling is noted in the mid-lower lumbar area but stops superior to the buttocks. Straight leg raise is negative. Lungs are clear without rales, wheezes, or rhonchi. Chest is nontender. Heart has regular rate and rhythm without murmur. Abdomen is soft, flat, nontender without masses or hepatosplenomegaly and peristalsis is normoactive. Extremities have no cyanosis or edema, full range of motion is present. Skin is warm and dry without rash. Neurologic: Mental status is normal, cranial nerves are intact, there are no motor deficits. She has slightly improved sensation in the distal portion of her legs compared with the proximal portion.  ED Course  Procedures (including critical care time) Labs Review Results for orders placed or performed during the hospital encounter of 05/27/14  Comprehensive metabolic panel  Result  Value Ref Range   Sodium 139 137 - 147 mEq/L   Potassium 3.6 (L) 3.7 - 5.3 mEq/L   Chloride 101 96 - 112 mEq/L   CO2 25 19 - 32 mEq/L   Glucose, Bld 87 70 - 99 mg/dL   BUN 5 (L) 6 - 23 mg/dL   Creatinine, Ser 0.72 0.50 - 1.10 mg/dL   Calcium 9.1 8.4 - 10.5 mg/dL   Total Protein 7.0 6.0 - 8.3 g/dL   Albumin 3.8 3.5 - 5.2 g/dL   AST 13 0 - 37 U/L   ALT 9 0 - 35 U/L   Alkaline Phosphatase 74 39 - 117 U/L   Total Bilirubin 0.6 0.3 - 1.2 mg/dL    GFR calc non Af Amer >90 >90 mL/min   GFR calc Af Amer >90 >90 mL/min   Anion gap 13 5 - 15  CBC with Differential  Result Value Ref Range   WBC 6.8 4.0 - 10.5 K/uL   RBC 5.09 3.87 - 5.11 MIL/uL   Hemoglobin 15.4 (H) 12.0 - 15.0 g/dL   HCT 43.4 36.0 - 46.0 %   MCV 85.3 78.0 - 100.0 fL   MCH 30.3 26.0 - 34.0 pg   MCHC 35.5 30.0 - 36.0 g/dL   RDW 13.0 11.5 - 15.5 %   Platelets 231 150 - 400 K/uL   Neutrophils Relative % 59 43 - 77 %   Neutro Abs 3.9 1.7 - 7.7 K/uL   Lymphocytes Relative 31 12 - 46 %   Lymphs Abs 2.1 0.7 - 4.0 K/uL   Monocytes Relative 7 3 - 12 %   Monocytes Absolute 0.5 0.1 - 1.0 K/uL   Eosinophils Relative 3 0 - 5 %   Eosinophils Absolute 0.2 0.0 - 0.7 K/uL   Basophils Relative 0 0 - 1 %   Basophils Absolute 0.0 0.0 - 0.1 K/uL  Urinalysis, Routine w reflex microscopic  Result Value Ref Range   Color, Urine YELLOW YELLOW   APPearance CLEAR CLEAR   Specific Gravity, Urine <1.005 (L) 1.005 - 1.030   pH 6.0 5.0 - 8.0   Glucose, UA NEGATIVE NEGATIVE mg/dL   Hgb urine dipstick NEGATIVE NEGATIVE   Bilirubin Urine NEGATIVE NEGATIVE   Ketones, ur NEGATIVE NEGATIVE mg/dL   Protein, ur NEGATIVE NEGATIVE mg/dL   Urobilinogen, UA 0.2 0.0 - 1.0 mg/dL   Nitrite NEGATIVE NEGATIVE   Leukocytes, UA NEGATIVE NEGATIVE    Imaging Review Ct Abdomen Pelvis W Contrast  05/27/2014   CLINICAL DATA:  Burning sensation in low back, tingling bilateral feet.  EXAM: CT ABDOMEN AND PELVIS WITH CONTRAST  TECHNIQUE: Multidetector CT imaging of the abdomen and pelvis was performed using the standard protocol following bolus administration of intravenous contrast.  CONTRAST:  175mL OMNIPAQUE IOHEXOL 300 MG/ML  SOLN  COMPARISON:  CT of the lumbar spine January 26, 2014  FINDINGS: LUNG BASES: Included view of the lung bases demonstrates calcified granuloma RIGHT lower lobe. Visualized heart and pericardium are unremarkable.  SOLID ORGANS: Status post cholecystectomy. Minimal intrahepatic biliary  dilatation is likely post procedural. Spleen, pancreas, adrenal glands are unremarkable.  GASTROINTESTINAL TRACT: The stomach, small and large bowel are normal in course and caliber without inflammatory changes. The appendix is not discretely identified, however there are no inflammatory changes in the right lower quadrant.  KIDNEYS/ URINARY TRACT: Kidneys are orthotopic, demonstrating symmetric enhancement. 2 mm RIGHT upper pole nephrolithiasis. Punctate LEFT lower pole nephrolithiasis. No hydronephrosis or solid renal masses. The unopacified ureters  are normal in course and caliber. Delayed imaging through the kidneys demonstrates symmetric prompt contrast excretion within the proximal urinary collecting system. Urinary bladder is partially distended and unremarkable.  PERITONEUM/RETROPERITONEUM: No intraperitoneal free fluid nor free air. Aortoiliac vessels are normal in course and caliber, moderate intimal thickening and calcific atherosclerosis. No lymphadenopathy by CT size criteria. Status post hysterectomy. 2.2 cm benign appearing LEFT adnexal cyst.  SOFT TISSUE/OSSEOUS STRUCTURES: Nonsuspicious. Stable Mild to moderate L2-3 degenerative disc.  IMPRESSION: No acute intra-abdominal nor pelvic process.  Nonobstructing bilateral nephrolithiasis measuring up to 2 mm in RIGHT upper pole.  Status post cholecystectomy with minimal intrahepatic biliary dilatation.   Electronically Signed   By: Elon Alas   On: 05/27/2014 05:41   Images viewed by me.   MDM   Final diagnoses:  Back pain  Low back pain without sciatica, unspecified back pain laterality    Low back discomfort which probably her equivalent of low back pain. No evidence of significant neurologic injury. I am not sure the significance of the mottling that she is seeing so she will be sent for CT scan for further evaluation. Old records are reviewed and she has prior ED visits for burning pain in her back diagnosed as degenerative disc  disease.  Workup is negative for any process other than low back pain from musculoskeletal cause. She is discharged with prescriptions for hydrocodone-acetaminophen, and cyclobenzaprine. She is told to take over-the-counter naproxen and advised to obtain a PCP for ongoing management.  Delora Fuel, MD 50/56/97 [id number]

## 2014-05-27 NOTE — Discharge Instructions (Signed)
Take two naproxen (Aleve) tablets at a time, twice a day.  Back Pain, Adult Low back pain is very common. About 1 in 5 people have back pain.The cause of low back pain is rarely dangerous. The pain often gets better over time.About half of people with a sudden onset of back pain feel better in just 2 weeks. About 8 in 10 people feel better by 6 weeks.  CAUSES Some common causes of back pain include:  Strain of the muscles or ligaments supporting the spine.  Wear and tear (degeneration) of the spinal discs.  Arthritis.  Direct injury to the back. DIAGNOSIS Most of the time, the direct cause of low back pain is not known.However, back pain can be treated effectively even when the exact cause of the pain is unknown.Answering your caregiver's questions about your overall health and symptoms is one of the most accurate ways to make sure the cause of your pain is not dangerous. If your caregiver needs more information, he or she may order lab work or imaging tests (X-rays or MRIs).However, even if imaging tests show changes in your back, this usually does not require surgery. HOME CARE INSTRUCTIONS For many people, back pain returns.Since low back pain is rarely dangerous, it is often a condition that people can learn to [facility] their own.   Remain active. It is stressful on the back to sit or stand in one place. Do not sit, drive, or stand in one place for more than 30 minutes at a time. Take short walks on level surfaces as soon as pain allows.Try to increase the length of time you walk each day.  Do not stay in bed.Resting more than 1 or 2 days can delay your recovery.  Do not avoid exercise or work.Your body is made to move.It is not dangerous to be active, even though your back may hurt.Your back will likely heal faster if you return to being active before your pain is gone.  Pay attention to your body when you bend and lift. Many people have less discomfortwhen lifting if they  bend their knees, keep the load close to their bodies,and avoid twisting. Often, the most comfortable positions are those that put less stress on your recovering back.  Find a comfortable position to sleep. Use a firm mattress and lie on your side with your knees slightly bent. If you lie on your back, put a pillow under your knees.  Only take over-the-counter or prescription medicines as directed by your caregiver. Over-the-counter medicines to reduce pain and inflammation are often the most helpful.Your caregiver may prescribe muscle relaxant drugs.These medicines help dull your pain so you can more quickly return to your normal activities and healthy exercise.  Put ice on the injured area.  Put ice in a plastic bag.  Place a towel between your skin and the bag.  Leave the ice on for 15-20 minutes, 03-04 times a day for the first 2 to 3 days. After that, ice and heat may be alternated to reduce pain and spasms.  Ask your caregiver about trying back exercises and gentle massage. This may be of some benefit.  Avoid feeling anxious or stressed.Stress increases muscle tension and can worsen back pain.It is important to recognize when you are anxious or stressed and learn ways to manage it.Exercise is a great option. SEEK MEDICAL CARE IF:  You have pain that is not relieved with rest or medicine.  You have pain that does not improve in 1 week.  You have new symptoms.  You are generally not feeling well. SEEK IMMEDIATE MEDICAL CARE IF:   You have pain that radiates from your back into your legs.  You develop new bowel or bladder control problems.  You have unusual weakness or numbness in your arms or legs.  You develop nausea or vomiting.  You develop abdominal pain.  You feel faint. Document Released: 06/16/2005 Document Revised: 12/16/2011 Document Reviewed: 10/18/2013 [facility] Patient Information 2015 [place], [place]. This information is not intended to replace advice  given to you by your health care provider. Make sure you discuss any questions you have with your health care provider.  Cyclobenzaprine tablets What is this medicine? CYCLOBENZAPRINE (sye kloe BEN za preen) is a muscle relaxer. It is used to treat muscle pain, spasms, and stiffness. This medicine may be used for other purposes; ask your health care provider or pharmacist if you have questions. COMMON BRAND NAME(S): Fexmid, Flexeril What should I tell my health care provider before I take this medicine? They need to know if you have any of these conditions: -heart disease, irregular heartbeat, or previous heart attack -liver disease -thyroid problem -an unusual or allergic reaction to cyclobenzaprine, tricyclic antidepressants, lactose, other medicines, foods, dyes, or preservatives -pregnant or trying to get pregnant -breast-feeding How should I use this medicine? Take this medicine by mouth with a glass of water. Follow the directions on the prescription label. If this medicine upsets your stomach, take it with food or milk. Take your medicine at regular intervals. Do not take it more often than directed. Talk to your pediatrician regarding the use of this medicine in children. Special care may be needed. Overdosage: If you think you have taken too much of this medicine contact a poison control center or emergency room at once. NOTE: This medicine is only for you. Do not share this medicine with others. What if I miss a dose? If you miss a dose, take it as soon as you can. If it is almost time for your next dose, take only that dose. Do not take double or extra doses. What may interact with this medicine? Do not take this medicine with any of the following medications: -certain medicines for fungal infections like fluconazole, itraconazole, ketoconazole, posaconazole,  voriconazole -cisapride -dofetilide -dronedarone -droperidol -flecainide -grepafloxacin -halofantrine -levomethadyl -MAOIs like Carbex, Eldepryl, Marplan, Nardil, and Parnate -nilotinib -pimozide -probucol -sertindole -thioridazine -ziprasidone This medicine may also interact with the following medications: -abarelix -alcohol -certain medicines for cancer -certain medicines for depression, anxiety, or psychotic disturbances -certain medicines for infection like alfuzosin, chloroquine, clarithromycin, levofloxacin, mefloquine, pentamidine, troleandomycin -certain medicines for an irregular heart beat -certain medicines used for sleep or numbness during surgery or procedure -contrast dyes -dolasetron -guanethidine -methadone -octreotide -ondansetron -other medicines that prolong the QT interval (cause an abnormal heart rhythm) -palonosetron -phenothiazines like chlorpromazine, mesoridazine, prochlorperazine, thioridazine -tramadol -vardenafil This list may not describe all possible interactions. Give your health care provider a list of all the medicines, herbs, non-prescription drugs, or dietary supplements you use. Also tell them if you smoke, drink alcohol, or use illegal drugs. Some items may interact with your medicine. What should I watch for while using this medicine? Check with your doctor or health care professional if your condition does not improve within 1 to 3 weeks. You may get drowsy or dizzy when you first start taking the medicine or change doses. Do not drive, use machinery, or do anything that may be dangerous until you know how the medicine  affects you. Stand or sit up slowly. Your mouth may get dry. Drinking water, chewing sugarless gum, or sucking on hard candy may help. What side effects may I notice from receiving this medicine? Side effects that you should report to your doctor or health care professional as soon as possible: -allergic reactions like  skin rash, itching or hives, swelling of the face, lips, or tongue -chest pain -fast heartbeat -hallucinations -seizures -vomiting Side effects that usually do not require medical attention (report to your doctor or health care professional if they continue or are bothersome): -headache This list may not describe all possible side effects. Call your doctor for medical advice about side effects. You may report side effects to FDA at 1-800-FDA-1088. Where should I keep my medicine? Keep out of the reach of children. Store at room temperature between 15 and 30 degrees C (59 and 86 degrees F). Keep container tightly closed. Throw away any unused medicine after the expiration date. NOTE: This sheet is a summary. It may not cover all possible information. If you have questions about this medicine, talk to your doctor, pharmacist, or health care provider.  2015, Elsevier/Gold Standard. (2013-01-11 12:48:19)  Acetaminophen; Hydrocodone tablets or capsules What is this medicine? ACETAMINOPHEN; HYDROCODONE (a set a MEE noe fen; hye droe KOE done) is a pain reliever. It is used to treat mild to moderate pain. This medicine may be used for other purposes; ask your health care provider or pharmacist if you have questions. COMMON BRAND NAME(S): Anexsia, Bancap HC, Ceta-Plus, Co-Gesic, Comfortpak, Dolagesic, [employer], DuoCet, Hydrocet, Hydrogesic, [place], Lorcet HD, Lorcet Plus, Lortab, Margesic H, Maxidone, [place], Polygesic, [place], [place], [employer]in crew, Vicodin, Vicodin ES, Vicodin HP, Charlane Ferretti What should I tell my health care provider before I take this medicine? They need to know if you have any of these conditions: -brain tumor -Crohn's disease, inflammatory bowel disease, or ulcerative colitis -drug abuse or addiction -head injury -heart or circulation problems -if you often drink alcohol -kidney disease or problems going to the bathroom -liver disease -lung disease, asthma, or  breathing problems -an unusual or allergic reaction to acetaminophen, hydrocodone, other opioid analgesics, other medicines, foods, dyes, or preservatives -pregnant or trying to get pregnant -breast-feeding How should I use this medicine? Take this medicine by mouth. Swallow it with a full glass of water. Follow the directions on the prescription label. If the medicine upsets your stomach, take the medicine with food or milk. Do not take more than you are told to take. Talk to your pediatrician regarding the use of this medicine in children. This medicine is not approved for use in children. Overdosage: If you think you have taken too much of this medicine contact a poison control center or emergency room at once. NOTE: This medicine is only for you. Do not share this medicine with others. What if I miss a dose? If you miss a dose, take it as soon as you can. If it is almost time for your next dose, take only that dose. Do not take double or extra doses. What may interact with this medicine? -alcohol -antihistamines -isoniazid -medicines for depression, anxiety, or psychotic disturbances -medicines for sleep -muscle relaxants -naltrexone -narcotic medicines (opiates) for pain -phenobarbital -ritonavir -tramadol This list may not describe all possible interactions. Give your health care provider a list of all the medicines, herbs, non-prescription drugs, or dietary supplements you use. Also tell them if you smoke, drink alcohol, or use illegal drugs. Some items may interact with your  medicine. What should I watch for while using this medicine? Tell your doctor or health care professional if your pain does not go away, if it gets worse, or if you have new or a different type of pain. You may develop tolerance to the medicine. Tolerance means that you will need a higher dose of the medicine for pain relief. Tolerance is normal and is expected if you take the medicine for a long time. Do not  suddenly stop taking your medicine because you may develop a severe reaction. Your body becomes used to the medicine. This does NOT mean you are addicted. Addiction is a behavior related to getting and using a drug for a non-medical reason. If you have pain, you have a medical reason to take pain medicine. Your doctor will tell you how much medicine to take. If your doctor wants you to stop the medicine, the dose will be slowly lowered over time to avoid any side effects. You may get drowsy or dizzy when you first start taking the medicine or change doses. Do not drive, use machinery, or do anything that may be dangerous until you know how the medicine affects you. Stand or sit up slowly. There are different types of narcotic medicines (opiates) for pain. If you take more than one type at the same time, you may have more side effects. Give your health care provider a list of all medicines you use. Your doctor will tell you how much medicine to take. Do not take more medicine than directed. Call emergency for help if you have problems breathing. The medicine will cause constipation. Try to have a bowel movement at least every 2 to 3 days. If you do not have a bowel movement for 3 days, call your doctor or health care professional. Too much acetaminophen can be very dangerous. Do not take Tylenol (acetaminophen) or medicines that contain acetaminophen with this medicine. Many non-prescription medicines contain acetaminophen. Always read the labels carefully. What side effects may I notice from receiving this medicine? Side effects that you should report to your doctor or health care professional as soon as possible: -allergic reactions like skin rash, itching or hives, swelling of the face, lips, or tongue -breathing problems -confusion -feeling faint or lightheaded, falls -stomach pain -yellowing of the eyes or skin Side effects that usually do not require medical attention (report to your doctor or  health care professional if they continue or are bothersome): -nausea, vomiting -stomach upset This list may not describe all possible side effects. Call your doctor for medical advice about side effects. You may report side effects to FDA at 1-800-FDA-1088. Where should I keep my medicine? Keep out of the reach of children. This medicine can be abused. Keep your medicine in a safe place to protect it from theft. Do not share this medicine with anyone. Selling or giving away this medicine is dangerous and against the law. Store at room temperature between 15 and 30 degrees C (59 and 86 degrees F). Protect from light. Keep container tightly closed. Throw away any unused medicine after the expiration date. Discard unused medicine and used packaging carefully. Pets and children can be harmed if they find used or lost packages. NOTE: This sheet is a summary. It may not cover all possible information. If you have questions about this medicine, talk to your doctor, pharmacist, or health care provider.  2015, Elsevier/Gold Standard. (2013-02-07 13:15:56)

## 2014-05-27 NOTE — ED Notes (Signed)
Pt c/o burning in lower back with a discoloration from her lower back onto her buttocks. Pt also c/o numbness and tingling in her feet due to degenerative disc disease.

## 2014-09-18 ENCOUNTER — Other Ambulatory Visit (HOSPITAL_COMMUNITY): Payer: Self-pay | Admitting: Nurse Practitioner

## 2014-09-18 DIAGNOSIS — Z1231 Encounter for screening mammogram for malignant neoplasm of breast: Secondary | ICD-10-CM

## 2014-10-11 ENCOUNTER — Emergency Department (HOSPITAL_COMMUNITY): Payer: Self-pay

## 2014-10-11 ENCOUNTER — Encounter (HOSPITAL_COMMUNITY): Payer: Self-pay | Admitting: *Deleted

## 2014-10-11 ENCOUNTER — Emergency Department (HOSPITAL_COMMUNITY)
Admission: EM | Admit: 2014-10-11 | Discharge: 2014-10-11 | Disposition: A | Discharge status: Home / Self Care | Payer: Self-pay | Attending: Emergency Medicine | Admitting: Emergency Medicine

## 2014-10-11 DIAGNOSIS — R05 Cough: Secondary | ICD-10-CM | POA: Insufficient documentation

## 2014-10-11 DIAGNOSIS — Z720 Tobacco use: Secondary | ICD-10-CM | POA: Insufficient documentation

## 2014-10-11 DIAGNOSIS — Z79899 Other long term (current) drug therapy: Secondary | ICD-10-CM | POA: Insufficient documentation

## 2014-10-11 DIAGNOSIS — M791 Myalgia: Secondary | ICD-10-CM | POA: Insufficient documentation

## 2014-10-11 DIAGNOSIS — Z8709 Personal history of other diseases of the respiratory system: Secondary | ICD-10-CM | POA: Insufficient documentation

## 2014-10-11 DIAGNOSIS — Z880 Allergy status to penicillin: Secondary | ICD-10-CM | POA: Insufficient documentation

## 2014-10-11 DIAGNOSIS — R0789 Other chest pain: Secondary | ICD-10-CM | POA: Insufficient documentation

## 2014-10-11 DIAGNOSIS — I10 Essential (primary) hypertension: Secondary | ICD-10-CM | POA: Insufficient documentation

## 2014-10-11 LAB — CBC WITH DIFFERENTIAL/PLATELET
Basophils Absolute: 0 10*3/uL (ref 0.0–0.1)
Basophils Relative: 0 % (ref 0–1)
EOS ABS: 0.1 10*3/uL (ref 0.0–0.7)
Eosinophils Relative: 1 % (ref 0–5)
HEMATOCRIT: 42.9 % (ref 36.0–46.0)
HEMOGLOBIN: 14.8 g/dL (ref 12.0–15.0)
LYMPHS PCT: 25 % (ref 12–46)
Lymphs Abs: 1.3 10*3/uL (ref 0.7–4.0)
MCH: 29.7 pg (ref 26.0–34.0)
MCHC: 34.5 g/dL (ref 30.0–36.0)
MCV: 86.1 fL (ref 78.0–100.0)
Monocytes Absolute: 0.4 10*3/uL (ref 0.1–1.0)
Monocytes Relative: 7 % (ref 3–12)
NEUTROS ABS: 3.3 10*3/uL (ref 1.7–7.7)
Neutrophils Relative %: 67 % (ref 43–77)
Platelets: 208 10*3/uL (ref 150–400)
RBC: 4.98 MIL/uL (ref 3.87–5.11)
RDW: 12.9 % (ref 11.5–15.5)
WBC: 4.9 10*3/uL (ref 4.0–10.5)

## 2014-10-11 LAB — BASIC METABOLIC PANEL
Anion gap: 9 (ref 5–15)
BUN: 7 mg/dL (ref 6–23)
CHLORIDE: 104 mmol/L (ref 96–112)
CO2: 25 mmol/L (ref 19–32)
Calcium: 8.9 mg/dL (ref 8.4–10.5)
Creatinine, Ser: 0.71 mg/dL (ref 0.50–1.10)
GFR calc Af Amer: >90 mL/min (ref >90)
GLUCOSE: 93 mg/dL (ref 70–99)
POTASSIUM: 3.7 mmol/L (ref 3.5–5.1)
Sodium: 138 mmol/L (ref 135–145)

## 2014-10-11 LAB — D-DIMER, QUANTITATIVE: D-Dimer, Quant: 0.31 ug/mL-FEU (ref 0.00–0.48)

## 2014-10-11 LAB — TROPONIN I: Troponin I: <0.03 ng/mL (ref <0.031)

## 2014-10-11 MED ORDER — ACETAMINOPHEN 500 MG PO TABS
1000.0000 mg | ORAL_TABLET | Freq: Once | ORAL | Status: AC
  Administered 2014-10-11: 1000 mg via ORAL
  Filled 2014-10-11: qty 2

## 2014-10-11 NOTE — ED Provider Notes (Signed)
CSN: [id number]     Arrival date & time 10/11/14  1223 History   First MD Initiated Contact with Patient 10/11/14 1313     Chief Complaint  Patient presents with  . Chest Pain     (Consider location/radiation/quality/duration/timing/severity/associated sxs/prior Treatment) HPI [place] of bilateral anterior diffuse chest pain and bilateral breast pain for 2 weeks, constant worse with cough associated symptoms include mild shortness of breath, nonproductive cough. Denies fever. Denies nausea or vomiting. Treated with antacids and naproxen, without relief. No other associated symptoms Past Medical History  Diagnosis Date  . Hypertension   . Bronchitis, chronic   . Headache(784.0)     History of migraines,  takes propanolol for headaches   Past Surgical History  Procedure Laterality Date  . Abdominal hysterectomy    . Ankle surgery      left ankle surgery as teenager per pt  . Cholecystectomy  03/13/2011    Procedure: LAPAROSCOPIC CHOLECYSTECTOMY;  Surgeon: Donato Heinz;  Location: [facility];  Service: General;  Laterality: N/A;   History reviewed. No pertinent family history. History  Substance Use Topics  . Smoking status: Current Every Day Smoker -- 2.00 packs/day    Types: Cigarettes  . Smokeless tobacco: Not on file  . Alcohol Use: No   OB History    No data available     Review of Systems  Constitutional: Negative.   HENT: Negative.   Respiratory: Positive for cough.   Cardiovascular: Positive for chest pain.  Gastrointestinal: Negative.   Endocrine:       Bilateral breast pain  Musculoskeletal: Positive for myalgias.       Bilateral leg and foot pain for 2 years  Skin: Negative.   Neurological: Negative.   Psychiatric/Behavioral: Negative.   All other systems reviewed and are negative.     Allergies  Codeine; Influenza virus vacc split pf; Morphine and related; and Penicillins  Home Medications   Prior to Admission medications   Medication Sig Start  Date End Date Taking? Authorizing Provider  cyclobenzaprine (FLEXERIL) 10 MG tablet Take 1 tablet (10 mg total) by mouth 3 (three) times daily as needed for muscle spasms. 37/10/62  Yes Delora Fuel, MD  omeprazole (PRILOSEC) 20 MG capsule Take 20 mg by mouth daily.   Yes Historical Provider, MD  ranitidine (ZANTAC) 75 MG tablet Take 75 mg by mouth 2 (two) times daily.   Yes Historical Provider, MD  HYDROcodone-acetaminophen (NORCO) 5-325 MG per tablet Take 1 tablet by mouth every 4 (four) hours as needed for moderate pain. Patient not taking: Reported on 10/11/2014 69/48/54   Delora Fuel, MD   BP 627/03 mmHg  Pulse 82  Temp(Src) 98.3 F (36.8 C) (Oral)  Resp 18  Ht 5' 3.5" (1.613 m)  Wt 155 lb (70.308 kg)  BMI 27.02 kg/m2  SpO2 100% Physical Exam  Constitutional: She appears well-developed and well-nourished.  HENT:  Head: Normocephalic and atraumatic.  Eyes: Conjunctivae are normal. Pupils are equal, round, and reactive to light.  Neck: Neck supple. No tracheal deviation present. No thyromegaly present.  Cardiovascular: Normal rate and regular rhythm.   No murmur heard. Pulmonary/Chest: Effort normal and breath sounds normal. She exhibits no tenderness.  Mild diffuse chest wall tenderness anteriorly. Bilateral breasts without redness swelling or tenderness. No masses.nipples normal no axillary nodes  Abdominal: Soft. Bowel sounds are normal. She exhibits no distension. There is no tenderness.  Musculoskeletal: Normal range of motion. She exhibits no edema or tenderness.  All 4 extremities  without redness swelling or tenderness neurovascularly intact  Neurological: She is alert. Coordination normal.  Skin: Skin is warm and dry. No rash noted.  Psychiatric: She has a normal mood and affect.  Nursing note and vitals reviewed.   ED Course  Procedures (including critical care time) Labs Review Labs Reviewed - No data to display  Imaging Review No results found.   EKG  Interpretation None     . Date: 10/11/2014  Rate: 70  Rhythm: normal sinus rhythm  QRS Axis: normal  Intervals: normal  ST/T Wave abnormalities: normal  Conduction Disutrbances: none  Narrative Interpretation: unremarkable  3:40 PM patient reports Tylenol helped her pain. Chest x-ray viewed by me Results for orders placed or performed during the hospital encounter of 10/11/14  D-dimer, quantitative  Result Value Ref Range   D-Dimer, Quant 0.31 0.00 - 0.48 ug/mL-FEU  Basic metabolic panel  Result Value Ref Range   Sodium 138 135 - 145 mmol/L   Potassium 3.7 3.5 - 5.1 mmol/L   Chloride 104 96 - 112 mmol/L   CO2 25 19 - 32 mmol/L   Glucose, Bld 93 70 - 99 mg/dL   BUN 7 6 - 23 mg/dL   Creatinine, Ser 0.71 0.50 - 1.10 mg/dL   Calcium 8.9 8.4 - 10.5 mg/dL   GFR calc non Af Amer >90 >90 mL/min   GFR calc Af Amer >90 >90 mL/min   Anion gap 9 5 - 15  CBC with Differential/Platelet  Result Value Ref Range   WBC 4.9 4.0 - 10.5 K/uL   RBC 4.98 3.87 - 5.11 MIL/uL   Hemoglobin 14.8 12.0 - 15.0 g/dL   HCT 42.9 36.0 - 46.0 %   MCV 86.1 78.0 - 100.0 fL   MCH 29.7 26.0 - 34.0 pg   MCHC 34.5 30.0 - 36.0 g/dL   RDW 12.9 11.5 - 15.5 %   Platelets 208 150 - 400 K/uL   Neutrophils Relative % 67 43 - 77 %   Neutro Abs 3.3 1.7 - 7.7 K/uL   Lymphocytes Relative 25 12 - 46 %   Lymphs Abs 1.3 0.7 - 4.0 K/uL   Monocytes Relative 7 3 - 12 %   Monocytes Absolute 0.4 0.1 - 1.0 K/uL   Eosinophils Relative 1 0 - 5 %   Eosinophils Absolute 0.1 0.0 - 0.7 K/uL   Basophils Relative 0 0 - 1 %   Basophils Absolute 0.0 0.0 - 0.1 K/uL  Troponin I  Result Value Ref Range   Troponin I <0.03 <0.031 ng/mL   Dg Chest 2 View  10/11/2014   CLINICAL DATA:  CHEST PAIN chest pain for 2 weeks. Chronic bronchitis.  EXAM: CHEST  2 VIEW  COMPARISON:  None.  FINDINGS: Cardiopericardial silhouette within normal limits. Mediastinal contours normal. Trachea midline. No airspace disease or effusion. Bilateral pleural  apical thickening compatible with scarring. Monitoring leads project over the chest. Cholecystectomy clips are present in the right upper quadrant.  IMPRESSION: No active cardiopulmonary disease.   Electronically Signed   By: Dereck Ligas M.D.   On: 10/11/2014 14:21    MDM  Cardiac risk factors smoking family history, father had MI age 29, hypertension Final diagnoses:  None   Heart score equals 3 based on risk factors, age. Strongly doubt acute coronary syndrome based on highly atypical story, normal EKG, negative troponin. Low pretest clinical suspicion for pulmonary embolism. Negative d-dimer. Counseled patient for 5 minutes on smoking cessation. Plan suggest Tylenol. [facility] She has an appointment for mammogram 11/05/2014 Diagnoses #1 atypical chest pain #2Breast pain, bilateral #31myalgias #4tobacco abuse     Orlie Dakin, MD 10/11/14 1552

## 2014-10-11 NOTE — ED Notes (Signed)
Patient transported to X-ray 

## 2014-10-11 NOTE — Discharge Instructions (Signed)
Chest Pain (Nonspecific) Take Tylenol as directed for pain. Contact your physician at [facility]. Ask him/her help you to stop smoking. Keep your scheduled appointment for your mammogram on 11/05/2014 It is often hard to give a diagnosis for the cause of chest pain. There is always a chance that your pain could be related to something serious, such as a heart attack or a blood clot in the lungs. You need to follow up with your doctor. HOME CARE  If antibiotic medicine was given, take it as directed by your doctor. Finish the medicine even if you start to feel better.  For the next few days, avoid activities that bring on chest pain. Continue physical activities as told by your doctor.  Do not use any tobacco products. This includes cigarettes, chewing tobacco, and e-cigarettes.  Avoid drinking alcohol.  Only take medicine as told by your doctor.  Follow your doctor's suggestions for more testing if your chest pain does not go away.  Keep all doctor visits you made. GET HELP IF:  Your chest pain does not go away, even after treatment.  You have a rash with blisters on your chest.  You have a fever. GET HELP RIGHT AWAY IF:   You have more pain or pain that spreads to your arm, neck, jaw, back, or belly (abdomen).  You have shortness of breath.  You cough more than usual or cough up blood.  You have very bad back or belly pain.  You feel sick to your stomach (nauseous) or throw up (vomit).  You have very bad weakness.  You pass out (faint).  You have chills. This is an emergency. Do not wait to see if the problems will go away. Call your local emergency services (911 in [place] Do not drive yourself to the hospital. MAKE SURE YOU:   Understand these instructions.  Will watch your condition.  Will get help right away if you are not doing well or get worse. Document Released: 12/03/2007 Document Revised: 06/21/2013 Document Reviewed:  12/03/2007 [facility] Patient Information 2015 [place], [place]. This information is not intended to replace advice given to you by your health care provider. Make sure you discuss any questions you have with your health care provider.

## 2014-10-11 NOTE — ED Notes (Signed)
Chest pain for 2 weeks, cough non productive.  No fever.

## 2014-11-06 ENCOUNTER — Ambulatory Visit (HOSPITAL_COMMUNITY): Payer: Self-pay

## 2014-11-20 ENCOUNTER — Ambulatory Visit (HOSPITAL_COMMUNITY)
Admission: RE | Admit: 2014-11-20 | Discharge: 2014-11-20 | Disposition: A | Discharge status: Home / Self Care | Payer: Self-pay | Source: Ambulatory Visit | Attending: Nurse Practitioner | Admitting: Nurse Practitioner

## 2014-11-20 DIAGNOSIS — Z1231 Encounter for screening mammogram for malignant neoplasm of breast: Secondary | ICD-10-CM

## 2015-02-08 ENCOUNTER — Observation Stay (HOSPITAL_COMMUNITY)
Admission: EM | Admit: 2015-02-08 | Discharge: 2015-02-09 | Disposition: A | Discharge status: Home / Self Care | Payer: Self-pay | Attending: Internal Medicine | Admitting: Internal Medicine

## 2015-02-08 ENCOUNTER — Encounter (HOSPITAL_COMMUNITY): Payer: Self-pay | Admitting: Emergency Medicine

## 2015-02-08 ENCOUNTER — Emergency Department (HOSPITAL_COMMUNITY): Payer: Self-pay

## 2015-02-08 DIAGNOSIS — G43909 Migraine, unspecified, not intractable, without status migrainosus: Secondary | ICD-10-CM | POA: Insufficient documentation

## 2015-02-08 DIAGNOSIS — Z79899 Other long term (current) drug therapy: Secondary | ICD-10-CM | POA: Insufficient documentation

## 2015-02-08 DIAGNOSIS — Z880 Allergy status to penicillin: Secondary | ICD-10-CM | POA: Insufficient documentation

## 2015-02-08 DIAGNOSIS — R112 Nausea with vomiting, unspecified: Secondary | ICD-10-CM | POA: Diagnosis present

## 2015-02-08 DIAGNOSIS — I10 Essential (primary) hypertension: Secondary | ICD-10-CM | POA: Insufficient documentation

## 2015-02-08 DIAGNOSIS — R079 Chest pain, unspecified: Secondary | ICD-10-CM | POA: Insufficient documentation

## 2015-02-08 DIAGNOSIS — Z720 Tobacco use: Secondary | ICD-10-CM | POA: Insufficient documentation

## 2015-02-08 DIAGNOSIS — K529 Noninfective gastroenteritis and colitis, unspecified: Principal | ICD-10-CM | POA: Insufficient documentation

## 2015-02-08 DIAGNOSIS — Z9049 Acquired absence of other specified parts of digestive tract: Secondary | ICD-10-CM | POA: Insufficient documentation

## 2015-02-08 DIAGNOSIS — Z90710 Acquired absence of both cervix and uterus: Secondary | ICD-10-CM | POA: Insufficient documentation

## 2015-02-08 HISTORY — DX: Tobacco use: Z72.0

## 2015-02-08 LAB — LIPASE, BLOOD: Lipase: 28 U/L (ref 22–51)

## 2015-02-08 LAB — CBC
HEMATOCRIT: 45.1 % (ref 36.0–46.0)
Hemoglobin: 15.5 g/dL — ABNORMAL HIGH (ref 12.0–15.0)
MCH: 29.5 pg (ref 26.0–34.0)
MCHC: 34.4 g/dL (ref 30.0–36.0)
MCV: 85.9 fL (ref 78.0–100.0)
Platelets: 251 10*3/uL (ref 150–400)
RBC: 5.25 MIL/uL — AB (ref 3.87–5.11)
RDW: 13.1 % (ref 11.5–15.5)
WBC: 9.7 10*3/uL (ref 4.0–10.5)

## 2015-02-08 LAB — TROPONIN I

## 2015-02-08 LAB — COMPREHENSIVE METABOLIC PANEL
ALT: 33 U/L (ref 14–54)
AST: 25 U/L (ref 15–41)
Albumin: 4.5 g/dL (ref 3.5–5.0)
Alkaline Phosphatase: 70 U/L (ref 38–126)
Anion gap: 7 (ref 5–15)
BUN: 12 mg/dL (ref 6–20)
CO2: 27 mmol/L (ref 22–32)
Calcium: 9.4 mg/dL (ref 8.9–10.3)
Chloride: 104 mmol/L (ref 101–111)
Creatinine, Ser: 0.74 mg/dL (ref 0.44–1.00)
GLUCOSE: 99 mg/dL (ref 65–99)
POTASSIUM: 4.7 mmol/L (ref 3.5–5.1)
Sodium: 138 mmol/L (ref 135–145)
TOTAL PROTEIN: 7.5 g/dL (ref 6.5–8.1)
Total Bilirubin: 0.7 mg/dL (ref 0.3–1.2)

## 2015-02-08 MED ORDER — ONDANSETRON HCL 4 MG/2ML IJ SOLN
4.0000 mg | Freq: Once | INTRAMUSCULAR | Status: AC
  Administered 2015-02-08: 4 mg via INTRAVENOUS
  Filled 2015-02-08: qty 2

## 2015-02-08 MED ORDER — ASPIRIN 81 MG PO CHEW
324.0000 mg | CHEWABLE_TABLET | Freq: Once | ORAL | Status: AC
  Administered 2015-02-08: 324 mg via ORAL
  Filled 2015-02-08: qty 4

## 2015-02-08 MED ORDER — NITROGLYCERIN 0.4 MG SL SUBL
0.4000 mg | SUBLINGUAL_TABLET | Freq: Once | SUBLINGUAL | Status: AC
  Administered 2015-02-08: 0.4 mg via SUBLINGUAL

## 2015-02-08 MED ORDER — PROMETHAZINE HCL 25 MG PO TABS: 25.0000 mg | ORAL_TABLET | Freq: Four times a day (QID) | ORAL | Status: DC | PRN

## 2015-02-08 MED ORDER — NITROGLYCERIN 2 % TD OINT
0.5000 [in_us] | TOPICAL_OINTMENT | Freq: Once | TRANSDERMAL | Status: AC
  Administered 2015-02-09: 0.5 [in_us] via TOPICAL
  Filled 2015-02-08: qty 1

## 2015-02-08 MED ORDER — NITROGLYCERIN 0.4 MG SL SUBL
0.4000 mg | SUBLINGUAL_TABLET | Freq: Once | SUBLINGUAL | Status: AC
  Administered 2015-02-08: 0.4 mg via SUBLINGUAL
  Filled 2015-02-08: qty 1

## 2015-02-08 MED ORDER — NITROGLYCERIN 0.4 MG SL SUBL
SUBLINGUAL_TABLET | SUBLINGUAL | Status: AC
  Filled 2015-02-08: qty 1

## 2015-02-08 MED ORDER — SODIUM CHLORIDE 0.9 % IV BOLUS (SEPSIS)
1000.0000 mL | Freq: Once | INTRAVENOUS | Status: AC
  Administered 2015-02-08: 1000 mL via INTRAVENOUS

## 2015-02-08 MED ORDER — IBUPROFEN 800 MG PO TABS
800.0000 mg | ORAL_TABLET | Freq: Once | ORAL | Status: AC
  Administered 2015-02-08: 800 mg via ORAL
  Filled 2015-02-08: qty 1

## 2015-02-08 MED ORDER — NICOTINE 14 MG/24HR TD PT24
14.0000 mg | MEDICATED_PATCH | Freq: Once | TRANSDERMAL | Status: DC
  Administered 2015-02-08: 14 mg via TRANSDERMAL
  Filled 2015-02-08: qty 1

## 2015-02-08 MED ORDER — HYDROCODONE-ACETAMINOPHEN 5-325 MG PO TABS: 1.0000 | ORAL_TABLET | ORAL | Status: DC | PRN

## 2015-02-08 MED ORDER — ACETAMINOPHEN 500 MG PO TABS
1000.0000 mg | ORAL_TABLET | Freq: Once | ORAL | Status: AC
  Administered 2015-02-08: 1000 mg via ORAL
  Filled 2015-02-08: qty 2

## 2015-02-08 NOTE — ED Notes (Addendum)
PT c/o mid abdomen pain with nausea and diarrhea x1 day. PT also states a heaviness in her chest while laying flat. Diarrhea x4 today with no recent antibiotic use. PT also states she noticed bright red blood in her stools today.

## 2015-02-08 NOTE — ED Notes (Signed)
Patient was up for discharge and said she felt " heaviness in her chest and that her heart was racing and her left hand was going numb", informed EDP Lacinda Axon , who ask to repeat EKG and placed patient on cardiac monitor. Preformed neuro checks on patient, no deficits noted, sinus brady on monitor.

## 2015-02-08 NOTE — ED Provider Notes (Addendum)
CSN: [id number]     Arrival date & time 02/08/15  1816 History   First MD Initiated Contact with Patient 02/08/15 1927     Chief Complaint  Patient presents with  . Abdominal Pain     (Consider location/radiation/quality/duration/timing/severity/associated sxs/prior Treatment) HPI... Patient complains of mild abdominal pain with nausea, vomiting, diarrhea for 24 hours. Multiple episodes of diarrhea today with a small amount of blood when she wipes with toilet paper. Additionally patient complains of a heaviness in her chest at night while lying flat. Symptoms are not associated with any activity. No dyspnea, nausea, diaphoresis. She has hypertension but no diabetes. Severity of symptoms is mild to moderate. Patient is smoker. Brother had MI.  Past Medical History  Diagnosis Date  . Hypertension   . Bronchitis, chronic   . Headache(784.0)     History of migraines,  takes propanolol for headaches  . Tobacco abuse    Past Surgical History  Procedure Laterality Date  . Abdominal hysterectomy    . Ankle surgery      left ankle surgery as teenager per pt  . Cholecystectomy  03/13/2011    Procedure: LAPAROSCOPIC CHOLECYSTECTOMY;  Surgeon: Donato Heinz;  Location: [facility];  Service: General;  Laterality: N/A;   History reviewed. No pertinent family history. Social History  Substance Use Topics  . Smoking status: Current Every Day Smoker -- 0.50 packs/day    Types: Cigarettes  . Smokeless tobacco: None  . Alcohol Use: No   OB History    Gravida Para Term Preterm AB TAB SAB Ectopic Multiple Living            0     Review of Systems  All other systems reviewed and are negative.     Allergies  Codeine; Morphine and related; and Penicillins  Home Medications   Prior to Admission medications   Medication Sig Start Date End Date Taking? Authorizing Provider  diclofenac (VOLTAREN) 75 MG EC tablet Take 75 mg by mouth 2 (two) times daily as needed for mild pain or moderate pain.    Yes Historical Provider, MD  gabapentin (NEURONTIN) 300 MG capsule Take 300 mg by mouth 2 (two) times daily.  01/17/15  Yes Historical Provider, MD  lovastatin (MEVACOR) 20 MG tablet Take 20 mg by mouth every evening.   Yes Historical Provider, MD  omeprazole (PRILOSEC) 20 MG capsule Take 20 mg by mouth daily.   Yes Historical Provider, MD  ranitidine (ZANTAC) 75 MG tablet Take 75 mg by mouth 2 (two) times daily.   Yes Historical Provider, MD  nicotine (NICODERM CQ - DOSED IN MG/24 HOURS) 14 mg/24hr patch Place 1 patch (14 mg total) onto the skin once. 02/09/15   Radene Gunning, NP   BP 123/81 mmHg  Pulse 66  Temp(Src) 97.4 F (36.3 C) (Oral)  Resp 16  Ht 5' 3.5" (1.613 m)  Wt 166 lb 4.8 oz (75.433 kg)  BMI 28.99 kg/m2  SpO2 98% Physical Exam  Constitutional: She is oriented to person, place, and time. She appears well-developed and well-nourished.  HENT:  Head: Normocephalic and atraumatic.  Eyes: Conjunctivae and EOM are normal. Pupils are equal, round, and reactive to light.  Neck: Normal range of motion. Neck supple.  Cardiovascular: Normal rate and regular rhythm.   Pulmonary/Chest: Effort normal and breath sounds normal.  Abdominal: Soft. Bowel sounds are normal.  Min abd tenderness  Musculoskeletal: Normal range of motion.  Neurological: She is alert and oriented to person, place, and  time.  Skin: Skin is warm and dry.  Psychiatric: She has a normal mood and affect. Her behavior is normal.  Nursing note and vitals reviewed.   ED Course  Procedures (including critical care time) Labs Review Labs Reviewed  CBC - Abnormal; Notable for the following:    RBC 5.25 (*)    Hemoglobin 15.5 (*)    All other components within normal limits  LIPASE, BLOOD  COMPREHENSIVE METABOLIC PANEL  TROPONIN I  TROPONIN I  TROPONIN I    Imaging Review No results found. I, Gilmar Bua, personally reviewed and evaluated these images and lab results as part of my medical  decision-making.   EKG Interpretation   Date/Time:  Thursday February 08 2015 21:26:40 EDT Ventricular Rate:  56 PR Interval:  129 QRS Duration: 77 QT Interval:  441 QTC Calculation: 426 R Axis:   71 Text Interpretation:  Sinus rhythm Low voltage, precordial leads Confirmed  by Arissa Fagin  MD, Laren Orama ([id number]) on 02/08/2015 9:49:15 PM      MDM   Final diagnoses:  Gastroenteritis  Chest pain, unspecified chest pain type   Recheck at 2115.  Patient now complains of persistent heaviness in her chest with bilateral arm numbness. Will order aspirin and nitroglycerin. Disc c Dr Shanon Brow and Dr Roderic Palau.     Nat Christen, MD 02/08/15 2118  Nat Christen, MD 02/08/15 1117  Nat Christen, MD 02/22/15 1000

## 2015-02-09 ENCOUNTER — Encounter (HOSPITAL_COMMUNITY): Payer: Self-pay | Admitting: Internal Medicine

## 2015-02-09 ENCOUNTER — Observation Stay (HOSPITAL_COMMUNITY): Payer: Self-pay

## 2015-02-09 DIAGNOSIS — R112 Nausea with vomiting, unspecified: Secondary | ICD-10-CM | POA: Diagnosis present

## 2015-02-09 DIAGNOSIS — I10 Essential (primary) hypertension: Secondary | ICD-10-CM | POA: Diagnosis present

## 2015-02-09 DIAGNOSIS — R079 Chest pain, unspecified: Secondary | ICD-10-CM

## 2015-02-09 DIAGNOSIS — Z720 Tobacco use: Secondary | ICD-10-CM | POA: Diagnosis present

## 2015-02-09 DIAGNOSIS — K529 Noninfective gastroenteritis and colitis, unspecified: Secondary | ICD-10-CM

## 2015-02-09 LAB — TROPONIN I: Troponin I: <0.03 ng/mL (ref <0.031)

## 2015-02-09 MED ORDER — ONDANSETRON HCL 4 MG/2ML IJ SOLN: 4.0000 mg | Freq: Four times a day (QID) | INTRAMUSCULAR | Status: DC | PRN

## 2015-02-09 MED ORDER — ASPIRIN EC 325 MG PO TBEC
325.0000 mg | DELAYED_RELEASE_TABLET | Freq: Every day | ORAL | Status: DC
  Administered 2015-02-09: 325 mg via ORAL
  Filled 2015-02-09: qty 1

## 2015-02-09 MED ORDER — GI COCKTAIL ~~LOC~~: 30.0000 mL | Freq: Four times a day (QID) | ORAL | Status: DC | PRN

## 2015-02-09 MED ORDER — POTASSIUM CHLORIDE IN NACL 20-0.9 MEQ/L-% IV SOLN
INTRAVENOUS | Status: DC
  Administered 2015-02-09: 06:00:00 via INTRAVENOUS

## 2015-02-09 MED ORDER — ACETAMINOPHEN 325 MG PO TABS
650.0000 mg | ORAL_TABLET | ORAL | Status: DC | PRN
  Administered 2015-02-09: 650 mg via ORAL
  Filled 2015-02-09: qty 2

## 2015-02-09 MED ORDER — NICOTINE 14 MG/24HR TD PT24: 14.0000 mg | MEDICATED_PATCH | Freq: Once | TRANSDERMAL | Status: DC

## 2015-02-09 MED ORDER — NITROGLYCERIN 2 % TD OINT
0.5000 [in_us] | TOPICAL_OINTMENT | Freq: Four times a day (QID) | TRANSDERMAL | Status: DC
  Administered 2015-02-09 (×2): 0.5 [in_us] via TOPICAL
  Filled 2015-02-09: qty 1

## 2015-02-09 NOTE — H&P (Signed)
PCP:   No PCP Per Patient   Chief Complaint:  diarrhea  HPI: 49 yo female presented to the ED with complaints of diarrhea.  Mildly dehydrated and was in the process of being discharged.  Then she mentioned she had been having sscp for 2 weeks with no radiaton.  Lasting for up to an hour occurring in the  Middle of the night.  Has gerd, but this much more severe.  No fevers.  No sob, no swelling in legs.  Pt was given ntg in the ED which relieved her pain after 2 doses.  Pt was then referred for admission for cardiac work up.  Pt has no prior h/o CAD or no prior stress testing.  Review of Systems:  Positive and negative as per HPI otherwise all other systems are negative  Past Medical History: Past Medical History  Diagnosis Date  . Hypertension   . Bronchitis, chronic   . Headache(784.0)     History of migraines,  takes propanolol for headaches   Past Surgical History  Procedure Laterality Date  . Abdominal hysterectomy    . Ankle surgery      left ankle surgery as teenager per pt  . Cholecystectomy  03/13/2011    Procedure: LAPAROSCOPIC CHOLECYSTECTOMY;  Surgeon: Donato Heinz;  Location: [facility];  Service: General;  Laterality: N/A;    Medications: Prior to Admission medications   Medication Sig Start Date End Date Taking? Authorizing Provider  diclofenac (VOLTAREN) 75 MG EC tablet Take 75 mg by mouth 2 (two) times daily as needed for mild pain or moderate pain.   Yes Historical Provider, MD  gabapentin (NEURONTIN) 300 MG capsule Take 300 mg by mouth 2 (two) times daily.  01/17/15  Yes Historical Provider, MD  lovastatin (MEVACOR) 20 MG tablet Take 20 mg by mouth every evening.   Yes Historical Provider, MD  omeprazole (PRILOSEC) 20 MG capsule Take 20 mg by mouth daily.   Yes Historical Provider, MD  ranitidine (ZANTAC) 75 MG tablet Take 75 mg by mouth 2 (two) times daily.   Yes Historical Provider, MD  cyclobenzaprine (FLEXERIL) 10 MG tablet Take 1 tablet (10 mg total) by  mouth 3 (three) times daily as needed for muscle spasms. Patient not taking: Reported on 02/08/2015 41/66/06   Delora Fuel, MD    Allergies:   Allergies  Allergen Reactions  . Codeine Hives and Nausea And Vomiting  . Morphine And Related Nausea Only  . Penicillins Hives    Social History:  reports that she has been smoking Cigarettes.  She has been smoking about 0.50 packs per day. She does not have any smokeless tobacco history on file. She reports that she uses illicit drugs (Marijuana and Other-see comments). She reports that she does not drink alcohol.  Family History: CAD  Physical Exam: Filed Vitals:   02/08/15 2130 02/08/15 2155 02/08/15 2200 02/08/15 2230  BP: 156/93 152/86 155/94 137/85  Pulse: 57 63 61 61  Temp:  97.8 F (36.6 C)    TempSrc:  Oral    Resp: 25 27 14 16   Height:      Weight:      SpO2: 100% 99% 97% 96%   General appearance: alert, cooperative and no distress Head: Normocephalic, without obvious abnormality, atraumatic Eyes: negative Nose: Nares normal. Septum midline. Mucosa normal. No drainage or sinus tenderness. Neck: no JVD and supple, symmetrical, trachea midline Lungs: clear to auscultation bilaterally Heart: regular rate and rhythm, S1, S2 normal, no murmur, click,  rub or gallop Abdomen: soft, non-tender; bowel sounds normal; no masses,  no organomegaly Extremities: extremities normal, atraumatic, no cyanosis or edema Pulses: 2+ and symmetric Skin: Skin color, texture, turgor normal. No rashes or lesions Neurologic: Grossly normal    Labs on Admission:   Recent Labs  02/08/15 1903  NA 138  K 4.7  CL 104  CO2 27  GLUCOSE 99  BUN 12  CREATININE 0.74  CALCIUM 9.4    Recent Labs  02/08/15 1903  AST 25  ALT 33  ALKPHOS 70  BILITOT 0.7  PROT 7.5  ALBUMIN 4.5    Recent Labs  02/08/15 1903  LIPASE 28    Recent Labs  02/08/15 1903  WBC 9.7  HGB 15.5*  HCT 45.1  MCV 85.9  PLT 251    Recent Labs   02/08/15 1903  TROPONINI <0.03    Radiological Exams on Admission: Dg Chest Portable 1 View  02/08/2015   CLINICAL DATA:  Chest pain and tachycardia  EXAM: PORTABLE CHEST - 1 VIEW  COMPARISON:  October 11, 2014  FINDINGS: There is no edema or consolidation. The heart size and pulmonary vascularity are normal. No adenopathy. No pneumothorax. No bone lesions.  IMPRESSION: No abnormality noted.   Electronically Signed   By: Lowella Grip III M.D.   On: 02/08/2015 22:59   cxr reviewed and normal ekg nsr no acute issues  Assessment/Plan  49 yo female with diarrhea for over a day and chest pain for 2 weeks  Principal Problem:   Pain in the chest-atypical, but with several risk factors  obs on tele, romi.  Echo in am.  Will need outpt stress testing at some point.  ntg paste until rules out.  Active Problems:   Diarrhea-  Seems to be improving.  ivf overnight   Hypertension  obs on tele.  Full code.  Elizabeth Bullock A 02/09/2015, 12:18 AM

## 2015-02-09 NOTE — Progress Notes (Signed)
Pt's IV catheter removed and intact. Pt's IV site clean dry and intact. Discharge instructions medications and follow up appointments reviewed and discussed with patient. All questions answered and no further questions at this time. Pt in stable condition and in no acute distress at time of discharge. Pt escorted by nurse tech.

## 2015-02-09 NOTE — Discharge Summary (Signed)
Physician Discharge Summary  Elizabeth Bullock TOI:[id number] DOB: 1966-06-23 DOA: 02/08/2015  PCP: No PCP Per Patient  Admit date: 02/08/2015 Discharge date: 02/09/2015  Time spent: 40  minutes  Recommendations for Outpatient Follow-up:  1. Follow up with PCP 1-2 weeks for evaluation of GERD, persistent intermittent diarrhea    Discharge Diagnoses:  Principal Problem:   Pain in the chest Active Problems:   Gastroenteritis   Nausea with vomiting   Hypertension   Tobacco abuse   Discharge Condition: stable  Diet recommendation: heart healhty  Filed Weights   02/08/15 1844 02/09/15 0022  Weight: 74.844 kg (165 lb) 75.433 kg (166 lb 4.8 oz)    History of present illness:  49 yo female presented to the ED on 02/09/15 with complaints of diarrhea. Mildly dehydrated and was in the process of being discharged. Then she mentioned she had been having sscp for 2 weeks with no radiaton. Lasting for up to an hour occurring in the Middle of the night. History gerd, but reported this was much more severe. No fevers. No sob, no swelling in legs. Pt was given ntg in the ED which relieved her pain after 2 doses. Pt was then referred for admission for cardiac work up. Pt has no prior h/o CAD or no prior stress testing.  Hospital Course:  Chest pain-atypical. Resolved at discharge. No further episodes.  Heart score 2. No events on tele. Troponin negative x3. Will follow up with PCP 1-2 weeks for evaluation of chest pain and possible referral for stress test  Active Problems:  Diarrhea- hx of same. No recent antibiotics. No further stools. Follow up with PCP   Hypertension: controlled.  Tobacco use: cessation counseling offered  Procedures:  none  Consultations:  none  Discharge Exam: Filed Vitals:   02/09/15 0548  BP: 123/81  Pulse: 66  Temp: 97.4 F (36.3 C)  Resp:     General: well nourished ambulating in room with steady gait Cardiovascular: RRR no MGR NO LE  edema Respiratory: normal effort BS clear bilaterally no wheeze  Discharge Instructions   Discharge Instructions    Diet - low sodium heart healthy    Complete by:  As directed      Discharge instructions    Complete by:  As directed   Take medications as directed Stop smoking Follow up with PCP 1-2 weeks     Increase activity slowly    Complete by:  As directed           Current Discharge Medication List    START taking these medications   Details  nicotine (NICODERM CQ - DOSED IN MG/24 HOURS) 14 mg/24hr patch Place 1 patch (14 mg total) onto the skin once. Qty: 28 patch, Refills: 0      CONTINUE these medications which have NOT CHANGED   Details  diclofenac (VOLTAREN) 75 MG EC tablet Take 75 mg by mouth 2 (two) times daily as needed for mild pain or moderate pain.    gabapentin (NEURONTIN) 300 MG capsule Take 300 mg by mouth 2 (two) times daily.  Refills: 3    lovastatin (MEVACOR) 20 MG tablet Take 20 mg by mouth every evening.    omeprazole (PRILOSEC) 20 MG capsule Take 20 mg by mouth daily.    ranitidine (ZANTAC) 75 MG tablet Take 75 mg by mouth 2 (two) times daily.      STOP taking these medications     cyclobenzaprine (FLEXERIL) 10 MG tablet      HYDROcodone-acetaminophen (Sylvia)  5-325 MG per tablet        Allergies  Allergen Reactions  . Codeine Hives and Nausea And Vomiting  . Morphine And Related Nausea Only  . Penicillins Hives      The results of significant diagnostics from this hospitalization (including imaging, microbiology, ancillary and laboratory) are listed below for reference.    Significant Diagnostic Studies: Dg Chest Portable 1 View  02/08/2015   CLINICAL DATA:  Chest pain and tachycardia  EXAM: PORTABLE CHEST - 1 VIEW  COMPARISON:  October 11, 2014  FINDINGS: There is no edema or consolidation. The heart size and pulmonary vascularity are normal. No adenopathy. No pneumothorax. No bone lesions.  IMPRESSION: No abnormality noted.    Electronically Signed   By: Lowella Grip III M.D.   On: 02/08/2015 22:59    Microbiology: No results found for this or any previous visit (from the past 240 hour(s)).   Labs: Basic Metabolic Panel:  Recent Labs Lab 02/08/15 1903  NA 138  K 4.7  CL 104  CO2 27  GLUCOSE 99  BUN 12  CREATININE 0.74  CALCIUM 9.4   Liver Function Tests:  Recent Labs Lab 02/08/15 1903  AST 25  ALT 33  ALKPHOS 70  BILITOT 0.7  PROT 7.5  ALBUMIN 4.5    Recent Labs Lab 02/08/15 1903  LIPASE 28   No results for input(s): AMMONIA in the last 168 hours. CBC:  Recent Labs Lab 02/08/15 1903  WBC 9.7  HGB 15.5*  HCT 45.1  MCV 85.9  PLT 251   Cardiac Enzymes:  Recent Labs Lab 02/08/15 1903 02/09/15 0041 02/09/15 0622  TROPONINI <0.03 <0.03 <0.03   BNP: BNP (last 3 results) No results for input(s): BNP in the last 8760 hours.  ProBNP (last 3 results) No results for input(s): PROBNP in the last 8760 hours.  CBG: No results for input(s): GLUCAP in the last 168 hours.     SignedRadene Gunning  [facility] 02/09/2015, 11:01 AM

## 2015-10-15 ENCOUNTER — Encounter (HOSPITAL_COMMUNITY): Payer: Self-pay | Admitting: Emergency Medicine

## 2015-10-15 ENCOUNTER — Emergency Department (HOSPITAL_COMMUNITY): Payer: Self-pay

## 2015-10-15 ENCOUNTER — Emergency Department (HOSPITAL_COMMUNITY)
Admission: EM | Admit: 2015-10-15 | Discharge: 2015-10-16 | Disposition: A | Discharge status: Home / Self Care | Payer: Self-pay | Attending: Emergency Medicine | Admitting: Emergency Medicine

## 2015-10-15 DIAGNOSIS — Z791 Long term (current) use of non-steroidal anti-inflammatories (NSAID): Secondary | ICD-10-CM | POA: Insufficient documentation

## 2015-10-15 DIAGNOSIS — Z79899 Other long term (current) drug therapy: Secondary | ICD-10-CM | POA: Insufficient documentation

## 2015-10-15 DIAGNOSIS — I10 Essential (primary) hypertension: Secondary | ICD-10-CM | POA: Insufficient documentation

## 2015-10-15 DIAGNOSIS — R079 Chest pain, unspecified: Secondary | ICD-10-CM | POA: Insufficient documentation

## 2015-10-15 DIAGNOSIS — J449 Chronic obstructive pulmonary disease, unspecified: Secondary | ICD-10-CM | POA: Insufficient documentation

## 2015-10-15 DIAGNOSIS — F17210 Nicotine dependence, cigarettes, uncomplicated: Secondary | ICD-10-CM | POA: Insufficient documentation

## 2015-10-15 HISTORY — DX: Chronic obstructive pulmonary disease, unspecified: J44.9

## 2015-10-15 HISTORY — DX: Pure hypercholesterolemia, unspecified: E78.00

## 2015-10-15 HISTORY — DX: Gastro-esophageal reflux disease without esophagitis: K21.9

## 2015-10-15 HISTORY — DX: Other specified disorders of bone, unspecified site: M89.8X9

## 2015-10-15 LAB — BASIC METABOLIC PANEL
Anion gap: 9 (ref 5–15)
BUN: 10 mg/dL (ref 6–20)
CHLORIDE: 104 mmol/L (ref 101–111)
CO2: 24 mmol/L (ref 22–32)
CREATININE: 0.87 mg/dL (ref 0.44–1.00)
Calcium: 8.9 mg/dL (ref 8.9–10.3)
GFR calc non Af Amer: >60 mL/min (ref >60)
GLUCOSE: 96 mg/dL (ref 65–99)
Potassium: 3.6 mmol/L (ref 3.5–5.1)
Sodium: 137 mmol/L (ref 135–145)

## 2015-10-15 LAB — CBC
HEMATOCRIT: 38.3 % (ref 36.0–46.0)
HEMOGLOBIN: 13.1 g/dL (ref 12.0–15.0)
MCH: 30 pg (ref 26.0–34.0)
MCHC: 34.2 g/dL (ref 30.0–36.0)
MCV: 87.8 fL (ref 78.0–100.0)
Platelets: 239 10*3/uL (ref 150–400)
RBC: 4.36 MIL/uL (ref 3.87–5.11)
RDW: 12.7 % (ref 11.5–15.5)
WBC: 9.5 10*3/uL (ref 4.0–10.5)

## 2015-10-15 LAB — TROPONIN I: Troponin I: <0.03 ng/mL (ref <0.031)

## 2015-10-15 MED ORDER — ACETAMINOPHEN 325 MG PO TABS
650.0000 mg | ORAL_TABLET | Freq: Once | ORAL | Status: AC
  Administered 2015-10-15: 650 mg via ORAL
  Filled 2015-10-15: qty 2

## 2015-10-15 NOTE — ED Notes (Signed)
Patient complaining of mid chest pain radiating into bilateral shoulders, neck, and back x 3 days.

## 2015-10-15 NOTE — ED Provider Notes (Signed)
CSN: [id number]     Arrival date & time 10/15/15  2029 History  By signing my name below, I, Irene Pap, attest that this documentation has been prepared under the direction and in the presence of Ezequiel Essex, MD. Electronically Signed: Irene Pap, ED Scribe. 10/15/2015. 10:38 PM.   Chief Complaint  Patient presents with  . Chest Pain   The history is provided by the patient. No language interpreter was used.  HPI Comments: Elizabeth Bullock is a 50 y.o. Female with a hx of HTN, borderline DM, degenerative disorder of bone, tobacco abuse, and COPD who presents to the Emergency Department complaining of intermittent middle chest pain that radiates to the bilateral shoulders, neck and upper back onset 4 days ago. Pt states that her episodes of pain could last from 15 minutes to a few hours, but will go away on its own. She reports that her current pain has been constant for four hours and worsens with palpation. She reports associated wheezing. She reports that her bilateral arms are "heavy feeling; it feels like someone is trying to rip my arms out of the sockets." She has noticed a small area of bruising to the dorsum of the left thumb with no known cause of injury. Pt is not on blood thinners. She has not taken anything for her symptoms. She denies alleviating factors. Pt reports paternal and maternal family hx of heart disease. She denies hx of similar symptoms, heart disease or MI. She denies SOB or abdominal pain. Pt is a smoker. Pt takes Lisinopril, Voltaren, Neurontin, and Lovastatin daily.  PCP: Dr. Geannie Risen   Past Medical History  Diagnosis Date  . Hypertension   . Bronchitis, chronic ([place])   . Headache(784.0)     History of migraines,  takes propanolol for headaches  . Tobacco abuse   . COPD (chronic obstructive pulmonary disease) ([place])   . High cholesterol   . Degenerative disorder of bone   . GERD (gastroesophageal reflux disease)    Past Surgical History   Procedure Laterality Date  . Abdominal hysterectomy    . Ankle surgery      left ankle surgery as teenager per pt  . Cholecystectomy  03/13/2011    Procedure: LAPAROSCOPIC CHOLECYSTECTOMY;  Surgeon: Donato Heinz;  Location: [facility];  Service: General;  Laterality: N/A;   History reviewed. No pertinent family history. Social History  Substance Use Topics  . Smoking status: Current Every Day Smoker -- 0.50 packs/day    Types: Cigarettes  . Smokeless tobacco: None  . Alcohol Use: No   OB History    Gravida Para Term Preterm AB TAB SAB Ectopic Multiple Living            0     Review of Systems A complete 10 system review of systems was obtained and all systems are negative except as noted in the HPI and PMH.   Allergies  Codeine; Morphine and related; and Penicillins  Home Medications   Prior to Admission medications   Medication Sig Start Date End Date Taking? Authorizing Provider  albuterol (PROAIR HFA) 108 (90 Base) MCG/ACT inhaler Inhale 1-2 puffs into the lungs every 6 (six) hours as needed for wheezing or shortness of breath.   Yes Historical Provider, MD  diclofenac (VOLTAREN) 75 MG EC tablet Take 75 mg by mouth 2 (two) times daily as needed for mild pain or moderate pain.   Yes Historical Provider, MD  gabapentin (NEURONTIN) 300 MG capsule Take 600 mg by  mouth 3 (three) times daily.  01/17/15  Yes Historical Provider, MD  lisinopril (PRINIVIL,ZESTRIL) 20 MG tablet Take 20 mg by mouth daily.   Yes Historical Provider, MD  loratadine (CLARITIN) 10 MG tablet Take 10 mg by mouth daily.   Yes Historical Provider, MD  lovastatin (MEVACOR) 20 MG tablet Take 20 mg by mouth every evening.   Yes Historical Provider, MD  ranitidine (ZANTAC) 150 MG tablet Take 150 mg by mouth 2 (two) times daily.   Yes Historical Provider, MD  vitamin C (ASCORBIC ACID) 500 MG tablet Take 500 mg by mouth daily.   Yes Historical Provider, MD  nitroGLYCERIN (NITROSTAT) 0.4 MG SL tablet Place 1 tablet  (0.4 mg total) under the tongue every 5 (five) minutes as needed for chest pain. 10/16/15   Ezequiel Essex, MD  omeprazole (PRILOSEC) 20 MG capsule Take 1 capsule (20 mg total) by mouth daily. 10/16/15   Ezequiel Essex, MD   BP 107/50 mmHg  Pulse 61  Temp(Src) 97.5 F (36.4 C) (Oral)  Resp 17  Ht 5\' 3"  (1.6 m)  Wt 175 lb (79.379 kg)  BMI 31.01 kg/m2  SpO2 98% Physical Exam  Constitutional: She is oriented to person, place, and time. She appears well-developed and well-nourished. No distress.  Smells of tobacco  HENT:  Head: Normocephalic and atraumatic.  Mouth/Throat: Oropharynx is clear and moist. No oropharyngeal exudate.  Eyes: Conjunctivae and EOM are normal. Pupils are equal, round, and reactive to light.  Neck: Normal range of motion. Neck supple.  No meningismus.  Cardiovascular: Normal rate, regular rhythm, normal heart sounds and intact distal pulses.   No murmur heard. Pulmonary/Chest: Effort normal and breath sounds normal. No respiratory distress. She exhibits tenderness.  Central chest tenderness that somewhat reproduces her pain.  Abdominal: Soft. There is no tenderness. There is no rebound and no guarding.  Musculoskeletal: Normal range of motion. She exhibits no edema or tenderness.  Neurological: She is alert and oriented to person, place, and time. No cranial nerve deficit. She exhibits normal muscle tone. Coordination normal.   5/5 strength throughout. CN 2-12 intact.Equal grip strength.   Skin: Skin is warm.  Psychiatric: She has a normal mood and affect. Her behavior is normal.  Nursing note and vitals reviewed.   ED Course  Procedures (including critical care time) DIAGNOSTIC STUDIES: Oxygen Saturation is 100% on RA, normal by my interpretation.    COORDINATION OF CARE: 10:36 PM-Discussed treatment plan which includes labs, EKG and chest x-ray with pt at bedside and pt agreed to plan.    Labs Review Labs Reviewed  CBC  BASIC METABOLIC PANEL   TROPONIN I  TROPONIN I  D-DIMER, QUANTITATIVE (NOT AT [facility])    Imaging Review Dg Chest 2 View  10/15/2015  CLINICAL DATA:  Chest pain for 3 days EXAM: CHEST  2 VIEW COMPARISON:  February 08, 2015 FINDINGS: There is a nipple shadow on the right. Lungs are clear. Heart size and pulmonary vascularity are normal. No adenopathy. No bone lesions. IMPRESSION: No edema or consolidation. Electronically Signed   By: Lowella Grip III M.D.   On: 10/15/2015 21:06   I have personally reviewed and evaluated these images and lab results as part of my medical decision-making.   EKG Interpretation   Date/Time:  Monday October 15 2015 20:46:29 EDT Ventricular Rate:  64 PR Interval:  126 QRS Duration: 80 QT Interval:  398 QTC Calculation: 410 R Axis:   83 Text Interpretation:  Normal sinus rhythm Normal ECG  No significant change  was found Confirmed by Wyvonnia Dusky  MD, Carlyn Mullenbach [phone]) on 10/15/2015 10:36:20  PM      MDM   Final diagnoses:  Chest pain, unspecified chest pain type  3 days of central chest pain, radiating to both shoulders, lasting minutes to hours at a time.  EKG nsr. No ST changes.  Chest pain is somewhat reproducible. Has been waxing and waning for days. Heart score 3.  Did not follow up for stress test as recommended previously. Troponin negative. CXR negative.  Doubt PE or aortic dissection. D-dimer negative.  D/w patient observation versus outpatient followup.  She wishes to go home and not be admitted to hospital.  Advised she will need close followup with cardiology to schedule stress test. She is agreeable to second troponin. Dr Tomi Bamberger to disposition after that.    I personally performed the services described in this documentation, which was scribed in my presence. The recorded information has been reviewed and is accurate.    Ezequiel Essex, MD 10/16/15 [phone]

## 2015-10-16 LAB — TROPONIN I: Troponin I: <0.03 ng/mL (ref <0.031)

## 2015-10-16 LAB — D-DIMER, QUANTITATIVE (NOT AT ARMC): D DIMER QUANT: 0.46 ug{FEU}/mL (ref 0.00–0.50)

## 2015-10-16 MED ORDER — OMEPRAZOLE 20 MG PO CPDR: 20.0000 mg | DELAYED_RELEASE_CAPSULE | Freq: Every day | ORAL | Status: DC

## 2015-10-16 MED ORDER — NITROGLYCERIN 0.4 MG SL SUBL: 0.4000 mg | SUBLINGUAL_TABLET | SUBLINGUAL | Status: DC | PRN

## 2015-10-16 NOTE — Discharge Instructions (Signed)
Nonspecific Chest Pain  Follow up with the cardiologist for a stress test. Return to the ED if you develop new or worsening symptoms. Chest pain can be caused by many different conditions. There is always a chance that your pain could be related to something serious, such as a heart attack or a blood clot in your lungs. Chest pain can also be caused by conditions that are not life-threatening. If you have chest pain, it is very important to follow up with your health care provider. CAUSES  Chest pain can be caused by:  Heartburn.  Pneumonia or bronchitis.  Anxiety or stress.  Inflammation around your heart (pericarditis) or lung (pleuritis or pleurisy).  A blood clot in your lung.  A collapsed lung (pneumothorax). It can develop suddenly on its own (spontaneous pneumothorax) or from trauma to the chest.  Shingles infection (varicella-zoster virus).  Heart attack.  Damage to the bones, muscles, and cartilage that make up your chest wall. This can include:  Bruised bones due to injury.  Strained muscles or cartilage due to frequent or repeated coughing or overwork.  Fracture to one or more ribs.  Sore cartilage due to inflammation (costochondritis). RISK FACTORS  Risk factors for chest pain may include:  Activities that increase your risk for trauma or injury to your chest.  Respiratory infections or conditions that cause frequent coughing.  Medical conditions or overeating that can cause heartburn.  Heart disease or family history of heart disease.  Conditions or health behaviors that increase your risk of developing a blood clot.  Having had chicken pox (varicella zoster). SIGNS AND SYMPTOMS Chest pain can feel like:  Burning or tingling on the surface of your chest or deep in your chest.  Crushing, pressure, aching, or squeezing pain.  Dull or sharp pain that is worse when you move, cough, or take a deep breath.  Pain that is also felt in your back, neck,  shoulder, or arm, or pain that spreads to any of these areas. Your chest pain may come and go, or it may stay constant. DIAGNOSIS Lab tests or other studies may be needed to find the cause of your pain. Your health care provider may have you take a test called an ambulatory ECG (electrocardiogram). An ECG records your heartbeat patterns at the time the test is performed. You may also have other tests, such as:  Transthoracic echocardiogram (TTE). During echocardiography, sound waves are used to create a picture of all of the heart structures and to look at how blood flows through your heart.  Transesophageal echocardiogram (TEE).This is a more advanced imaging test that obtains images from inside your body. It allows your health care provider to see your heart in finer detail.  Cardiac monitoring. This allows your health care provider to monitor your heart rate and rhythm in real time.  Holter monitor. This is a portable device that records your heartbeat and can help to diagnose abnormal heartbeats. It allows your health care provider to track your heart activity for several days, if needed.  Stress tests. These can be done through exercise or by taking medicine that makes your heart beat more quickly.  Blood tests.  Imaging tests. TREATMENT  Your treatment depends on what is causing your chest pain. Treatment may include:  Medicines. These may include:  Acid blockers for heartburn.  Anti-inflammatory medicine.  Pain medicine for inflammatory conditions.  Antibiotic medicine, if an infection is present.  Medicines to dissolve blood clots.  Medicines to treat coronary  artery disease.  Supportive care for conditions that do not require medicines. This may include:  Resting.  Applying heat or cold packs to injured areas.  Limiting activities until pain decreases. HOME CARE INSTRUCTIONS  If you were prescribed an antibiotic medicine, finish it all even if you start to feel  better.  Avoid any activities that bring on chest pain.  Do not use any tobacco products, including cigarettes, chewing tobacco, or electronic cigarettes. If you need help quitting, ask your health care provider.  Do not drink alcohol.  Take medicines only as directed by your health care provider.  Keep all follow-up visits as directed by your health care provider. This is important. This includes any further testing if your chest pain does not go away.  If heartburn is the cause for your chest pain, you may be told to keep your head raised (elevated) while sleeping. This reduces the chance that acid will go from your stomach into your esophagus.  Make lifestyle changes as directed by your health care provider. These may include:  Getting regular exercise. Ask your health care provider to suggest some activities that are safe for you.  Eating a heart-healthy diet. A registered dietitian can help you to learn healthy eating options.  Maintaining a healthy weight.  Managing diabetes, if necessary.  Reducing stress. SEEK MEDICAL CARE IF:  Your chest pain does not go away after treatment.  You have a rash with blisters on your chest.  You have a fever. SEEK IMMEDIATE MEDICAL CARE IF:   Your chest pain is worse.  You have an increasing cough, or you cough up blood.  You have severe abdominal pain.  You have severe weakness.  You faint.  You have chills.  You have sudden, unexplained chest discomfort.  You have sudden, unexplained discomfort in your arms, back, neck, or jaw.  You have shortness of breath at any time.  You suddenly start to sweat, or your skin gets clammy.  You feel nauseous or you vomit.  You suddenly feel light-headed or dizzy.  Your heart begins to beat quickly, or it feels like it is skipping beats. These symptoms may represent a serious problem that is an emergency. Do not wait to see if the symptoms will go away. Get medical help right away.  Call your local emergency services (911 in the [place] Do not drive yourself to the hospital.   This information is not intended to replace advice given to you by your health care provider. Make sure you discuss any questions you have with your health care provider.   Document Released: 03/26/2005 Document Revised: 07/07/2014 Document Reviewed: 01/20/2014 Elsevier Interactive Patient Education [employer].

## 2015-10-16 NOTE — ED Notes (Signed)
Pt states understanding of care given and follow up instructions 

## 2015-10-16 NOTE — ED Provider Notes (Signed)
Pt left at change of shift to get second troponin, which is normal. Pt discharged with instructions to follow up with cardiology.    Results for orders placed or performed during the hospital encounter of 10/15/15  CBC  Result Value Ref Range   WBC 9.5 4.0 - 10.5 K/uL   RBC 4.36 3.87 - 5.11 MIL/uL   Hemoglobin 13.1 12.0 - 15.0 g/dL   HCT 38.3 36.0 - 46.0 %   MCV 87.8 78.0 - 100.0 fL   MCH 30.0 26.0 - 34.0 pg   MCHC 34.2 30.0 - 36.0 g/dL   RDW 12.7 11.5 - 15.5 %   Platelets 239 150 - 400 K/uL  Basic metabolic panel  Result Value Ref Range   Sodium 137 135 - 145 mmol/L   Potassium 3.6 3.5 - 5.1 mmol/L   Chloride 104 101 - 111 mmol/L   CO2 24 22 - 32 mmol/L   Glucose, Bld 96 65 - 99 mg/dL   BUN 10 6 - 20 mg/dL   Creatinine, Ser 0.87 0.44 - 1.00 mg/dL   Calcium 8.9 8.9 - 10.3 mg/dL   GFR calc non Af Amer >60 >60 mL/min   GFR calc Af Amer >60 >60 mL/min   Anion gap 9 5 - 15  Troponin I  Result Value Ref Range   Troponin I <0.03 <0.031 ng/mL  Troponin I  Result Value Ref Range   Troponin I <0.03 <0.031 ng/mL  D-dimer, quantitative (not at [facility])  Result Value Ref Range   D-Dimer, Quant 0.46 0.00 - 0.50 ug/mL-FEU   Laboratory interpretation all normal    Diagnoses that have been ruled out:  None  Diagnoses that are still under consideration:  None  Final diagnoses:  Chest pain, unspecified chest pain type    New Prescriptions   NITROGLYCERIN (NITROSTAT) 0.4 MG SL TABLET    Place 1 tablet (0.4 mg total) under the tongue every 5 (five) minutes as needed for chest pain.   OMEPRAZOLE (PRILOSEC) 20 MG CAPSULE    Take 1 capsule (20 mg total) by mouth daily.    Plan discharge  Rolland Porter, MD, Barbette Or, MD, Barbette Or, MD 10/16/15 [phone]

## 2017-09-14 ENCOUNTER — Emergency Department (HOSPITAL_COMMUNITY)
Admission: EM | Admit: 2017-09-14 | Discharge: 2017-09-15 | Disposition: A | Discharge status: Home / Self Care | Payer: Self-pay | Attending: Emergency Medicine | Admitting: Emergency Medicine

## 2017-09-14 ENCOUNTER — Other Ambulatory Visit: Payer: Self-pay

## 2017-09-14 ENCOUNTER — Encounter (HOSPITAL_COMMUNITY): Payer: Self-pay | Admitting: Emergency Medicine

## 2017-09-14 DIAGNOSIS — J449 Chronic obstructive pulmonary disease, unspecified: Secondary | ICD-10-CM | POA: Insufficient documentation

## 2017-09-14 DIAGNOSIS — Y929 Unspecified place or not applicable: Secondary | ICD-10-CM | POA: Insufficient documentation

## 2017-09-14 DIAGNOSIS — Z79899 Other long term (current) drug therapy: Secondary | ICD-10-CM | POA: Insufficient documentation

## 2017-09-14 DIAGNOSIS — S300XXA Contusion of lower back and pelvis, initial encounter: Secondary | ICD-10-CM

## 2017-09-14 DIAGNOSIS — Y9389 Activity, other specified: Secondary | ICD-10-CM | POA: Insufficient documentation

## 2017-09-14 DIAGNOSIS — Y999 Unspecified external cause status: Secondary | ICD-10-CM | POA: Insufficient documentation

## 2017-09-14 DIAGNOSIS — M47816 Spondylosis without myelopathy or radiculopathy, lumbar region: Secondary | ICD-10-CM

## 2017-09-14 DIAGNOSIS — F17210 Nicotine dependence, cigarettes, uncomplicated: Secondary | ICD-10-CM | POA: Insufficient documentation

## 2017-09-14 DIAGNOSIS — I10 Essential (primary) hypertension: Secondary | ICD-10-CM | POA: Insufficient documentation
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
## 2017-09-15 ENCOUNTER — Emergency Department (HOSPITAL_COMMUNITY): Payer: Self-pay

## 2017-09-15 MED ORDER — HYDROCODONE-ACETAMINOPHEN 5-325 MG PO TABS
2.0000 | ORAL_TABLET | Freq: Once | ORAL | Status: AC
  Administered 2017-09-15: 2 via ORAL
  Filled 2017-09-15: qty 2

## 2017-09-15 MED ORDER — ONDANSETRON HCL 4 MG PO TABS
4.0000 mg | ORAL_TABLET | Freq: Once | ORAL | Status: AC
  Administered 2017-09-15: 4 mg via ORAL
  Filled 2017-09-15: qty 1

## 2017-09-15 MED ORDER — CYCLOBENZAPRINE HCL 10 MG PO TABS: 10.0000 mg | ORAL_TABLET | Freq: Three times a day (TID) | ORAL | 0 refills | Status: DC

## 2017-09-15 MED ORDER — CYCLOBENZAPRINE HCL 10 MG PO TABS
10.0000 mg | ORAL_TABLET | Freq: Once | ORAL | Status: AC
  Administered 2017-09-15: 10 mg via ORAL
  Filled 2017-09-15: qty 1

## 2017-09-15 MED ORDER — HYDROCODONE-ACETAMINOPHEN 5-325 MG PO TABS
1.0000 | ORAL_TABLET | ORAL | 0 refills | Status: DC | PRN
Start: 2017-09-15 — End: 2018-12-23

## 2017-09-15 MED ORDER — DEXAMETHASONE 4 MG PO TABS: 4.0000 mg | ORAL_TABLET | Freq: Two times a day (BID) | ORAL | 0 refills | Status: DC

## 2017-09-15 NOTE — ED Provider Notes (Addendum)
[facility] EMERGENCY DEPARTMENT Provider Note   CSN: [id number] Arrival date & time: 09/14/17  2237     History   Chief Complaint Chief Complaint  Patient presents with  . Fall    HPI Elizabeth Bullock is a 52 y.o. female.  Patient is a 52 year old female who presents to the emergency department with a complaint of lower back pain and coccyx area pain following a fall.  The patient states that she was in a trailer that was being pulled by a tractor.  The trailer tilted, and the patient fell landing mostly on her coccyx area.  The patient states that she has a history of degenerative disease involving her bones and this fall has made them worse.  She has severe pain in the coccyx area.  She did not have any loss of bowel or bladder function.  She has pain when attempting to walk or get around.  She denies hitting her head, hurting her neck, or hurting her chest.  She denies hip area pain.  She denies any new pain of her knees or ankles.  She presents now for assistance with this issue.      Past Medical History:  Diagnosis Date  . Bronchitis, chronic ([place])   . COPD (chronic obstructive pulmonary disease) ([place])   . Degenerative disorder of bone   . GERD (gastroesophageal reflux disease)   . Headache(784.0)    History of migraines,  takes propanolol for headaches  . High cholesterol   . Hypertension   . Tobacco abuse     Patient Active Problem List   Diagnosis Date Noted  . Nausea with vomiting 02/09/2015  . Pain in the chest   . Gastroenteritis   . Hypertension   . Tobacco abuse   . Chest pain 02/08/2015    Past Surgical History:  Procedure Laterality Date  . ABDOMINAL HYSTERECTOMY    . ANKLE SURGERY     left ankle surgery as teenager per pt  . CHOLECYSTECTOMY  03/13/2011   Procedure: LAPAROSCOPIC CHOLECYSTECTOMY;  Surgeon: Donato Heinz;  Location: [facility];  Service: General;  Laterality: N/A;    OB History    Gravida Para Term Preterm AB Living             0    SAB TAB Ectopic Multiple Live Births                   Home Medications    Prior to Admission medications   Medication Sig Start Date End Date Taking? Authorizing Provider  albuterol (PROAIR HFA) 108 (90 Base) MCG/ACT inhaler Inhale 1-2 puffs into the lungs every 6 (six) hours as needed for wheezing or shortness of breath.   Yes [provider]  gabapentin (NEURONTIN) 300 MG capsule Take 600 mg by mouth 3 (three) times daily.  01/17/15  Yes [provider]  loratadine (CLARITIN) 10 MG tablet Take 10 mg by mouth daily.   Yes [provider]  nitroGLYCERIN (NITROSTAT) 0.4 MG SL tablet Place 1 tablet (0.4 mg total) under the tongue every 5 (five) minutes as needed for chest pain. 10/16/15  Yes Rancour, Annie Main, MD  omeprazole (PRILOSEC) 20 MG capsule Take 1 capsule (20 mg total) by mouth daily. 10/16/15  Yes Rancour, Annie Main, MD  ranitidine (ZANTAC) 150 MG tablet Take 150 mg by mouth 2 (two) times daily.   Yes [provider]  vitamin C (ASCORBIC ACID) 500 MG tablet Take 500 mg by mouth daily.   Yes  [provider]  diclofenac (VOLTAREN) 75 MG EC tablet Take 75 mg by mouth 2 (two) times daily as needed for mild pain or moderate pain.    [provider]  lisinopril (PRINIVIL,ZESTRIL) 20 MG tablet Take 20 mg by mouth daily.    [provider]  lovastatin (MEVACOR) 20 MG tablet Take 20 mg by mouth every evening.    [provider]    Family History History reviewed. No pertinent family history.  Social History Social History   Tobacco Use  . Smoking status: Current Every Day Smoker    Packs/day: 0.50    Types: Cigarettes  . Smokeless tobacco: Never Used  Substance Use Topics  . Alcohol use: No  . Drug use: Yes    Types: Marijuana, Other-see comments    Comment: last use 2 days ago     Allergies   Codeine; Morphine and related; and Penicillins   Review of Systems Review of Systems  Constitutional: Negative  for activity change.       All ROS Neg except as noted in HPI  HENT: Negative for nosebleeds.   Eyes: Negative for photophobia and discharge.  Respiratory: Negative for cough, shortness of breath and wheezing.   Cardiovascular: Negative for chest pain and palpitations.  Gastrointestinal: Negative for abdominal pain and blood in stool.  Genitourinary: Negative for dysuria, frequency and hematuria.  Musculoskeletal: Positive for arthralgias, back pain and gait problem. Negative for neck pain.  Skin: Negative.   Neurological: Negative for dizziness, seizures and speech difficulty.  Psychiatric/Behavioral: Negative for confusion and hallucinations.     Physical Exam Updated Vital Signs BP (!) 119/102 (BP Location: Left Arm)   Pulse 69   Temp 98.9 F (37.2 C) (Oral)   Resp 20   Ht 5\' 4"  (1.626 m)   Wt 72.6 kg (160 lb)   SpO2 100%   BMI 27.46 kg/m   Physical Exam  Constitutional: She is oriented to person, place, and time. She appears well-developed and well-nourished.  Non-toxic appearance.  HENT:  Head: Normocephalic.  Right Ear: Tympanic membrane and external ear normal.  Left Ear: Tympanic membrane and external ear normal.  Eyes: EOM and lids are normal. Pupils are equal, round, and reactive to light.  Neck: Normal range of motion. Neck supple. Carotid bruit is not present.  Cardiovascular: Normal rate, regular rhythm, normal heart sounds, intact distal pulses and normal pulses.  Pulmonary/Chest: Breath sounds normal. No respiratory distress.  Abdominal: Soft. Bowel sounds are normal. There is no tenderness. There is no guarding.  Musculoskeletal: Normal range of motion.       Lumbar back: She exhibits pain and spasm.       Back:  Lymphadenopathy:       Head (right side): No submandibular adenopathy present.       Head (left side): No submandibular adenopathy present.    She has no cervical adenopathy.  Neurological: She is alert and oriented to person, place, and time.  She has normal strength. No cranial nerve deficit or sensory deficit.  Skin: Skin is warm and dry.  Psychiatric: She has a normal mood and affect. Her speech is normal.  Nursing note and vitals reviewed.    ED Treatments / Results  Labs (all labs ordered are listed, but only abnormal results are displayed) Labs Reviewed - No data to display  EKG  EKG Interpretation None       Radiology Dg Lumbar Spine Complete  Result Date: 09/15/2017 CLINICAL DATA:  Fall  with lumbosacral and sacrococcygeal pain. EXAM: LUMBAR SPINE - COMPLETE 4+ VIEW COMPARISON:  Reformats from abdominopelvic CT 05/27/2014 FINDINGS: The alignment is maintained. Vertebral body heights are normal. There is no listhesis. The posterior elements are intact. Minimal endplate spurring at O2-D7 with disc space narrowing. Trace L3-L4 spurring with preservation of disc space. No fracture. Sacroiliac joints are symmetric and normal. IMPRESSION: 1. No fracture or acute osseous abnormality of the lumbar spine. 2. Minimal degenerative disc disease. Electronically Signed   By: Jeb Levering M.D.   On: 09/15/2017 01:27   Dg Sacrum/coccyx  Result Date: 09/15/2017 CLINICAL DATA:  Fall with lumbosacral and sacrococcygeal pain. EXAM: SACRUM AND COCCYX - 2+ VIEW COMPARISON:  Abdomen CT pelvis reformats 05/27/2014 FINDINGS: There is no evidence of fracture or other focal bone lesions. Cortical margins of the sacrum and coccyx are intact. Angulation of the distal coccyx is unchanged from prior CT. Sacral ala are maintained. IMPRESSION: Negative radiographs of the sacrum and coccyx. Electronically Signed   By: Jeb Levering M.D.   On: 09/15/2017 01:25    Procedures Procedures (including critical care time)  Medications Ordered in ED Medications  cyclobenzaprine (FLEXERIL) tablet 10 mg (not administered)  HYDROcodone-acetaminophen (NORCO/VICODIN) 5-325 MG per tablet 2 tablet (not administered)  ondansetron (ZOFRAN) tablet 4 mg (not  administered)     Initial Impression / Assessment and Plan / ED Course  I have reviewed the triage vital signs and the nursing notes.  Pertinent labs & imaging results that were available during my care of the patient were reviewed by me and considered in my medical decision making (see chart for details).       Final Clinical Impressions(s) / ED Diagnoses MDM  Patient fell out of a trailer that was being pulled by a tractor.  She complains of pain mostly of the coccyx area.  She complains of some aggravation of the chronic pain that she has in her lower back related to degenerative changes.  No gross neurologic deficits or vascular deficits appreciated of the lower extremities.  X-ray of the sacrum and coccyx is negative for fracture or dislocation. X-ray of the lumbar spine shows endplate spurring at the L2-L3 area with disc space narrowing.  There is also some spurring at the L3-L4 area.  No fracture is appreciated. Patient is ambulatory in the room, and to the bathroom.  I discussed the findings of the examination as well as the x-rays with the patient in terms of which she understands.  The patient will be treated with Flexeril, Decadron, and Norco.  The patient is to follow-up with her primary physician or return to the emergency department if any emergent changes, problems, or concerns.   Final diagnoses:  Contusion of coccyx, initial encounter  Arthritis, lumbar spine    ED Discharge Orders        Ordered    HYDROcodone-acetaminophen (NORCO/VICODIN) 5-325 MG tablet  Every 4 hours PRN     09/15/17 0145    cyclobenzaprine (FLEXERIL) 10 MG tablet  3 times daily     09/15/17 0145    dexamethasone (DECADRON) 4 MG tablet  2 times daily with meals     09/15/17 0145       Lily Kocher, PA-C 09/15/17 0153    Lily Kocher, PA-C 09/15/17 0159    Veryl Speak, MD 09/15/17 [phone]

## 2017-09-15 NOTE — Discharge Instructions (Signed)
Your blood pressure is elevated, please have this rechecked soon by the primary physician or the local health department.  The x-ray of your lumbar spine shows arthritis and degenerative changes, but no fracture or dislocation.  The x-ray of your coccyx is negative for fracture or dislocation.  Your examination shows evidence of muscle spasm in your lower back.  Please use Flexeril 3 times daily, use Decadron and diclofenac 2 times daily with food.  May use Norco for more severe pain.  Flexeril and Norco may cause drowsiness.  Please do not drive a vehicle, operate machinery, drink alcohol, or participate in activities requiring concentration when taking either of these medications.

## 2017-09-15 NOTE — ED Notes (Signed)
Pt was ambulatory from triage to exam room, from room to restroom and ambulatory in exam room without difficulty.

## 2017-09-23 ENCOUNTER — Encounter (HOSPITAL_COMMUNITY): Payer: Self-pay | Admitting: Emergency Medicine

## 2017-09-23 ENCOUNTER — Emergency Department (HOSPITAL_COMMUNITY)
Admission: EM | Admit: 2017-09-23 | Discharge: 2017-09-23 | Disposition: A | Discharge status: Home / Self Care | Payer: Self-pay | Attending: Emergency Medicine | Admitting: Emergency Medicine

## 2017-09-23 ENCOUNTER — Other Ambulatory Visit: Payer: Self-pay

## 2017-09-23 DIAGNOSIS — J449 Chronic obstructive pulmonary disease, unspecified: Secondary | ICD-10-CM | POA: Insufficient documentation

## 2017-09-23 DIAGNOSIS — Z79899 Other long term (current) drug therapy: Secondary | ICD-10-CM | POA: Insufficient documentation

## 2017-09-23 DIAGNOSIS — F17210 Nicotine dependence, cigarettes, uncomplicated: Secondary | ICD-10-CM | POA: Insufficient documentation

## 2017-09-23 DIAGNOSIS — I10 Essential (primary) hypertension: Secondary | ICD-10-CM | POA: Insufficient documentation

## 2017-09-23 DIAGNOSIS — M5431 Sciatica, right side: Secondary | ICD-10-CM

## 2017-09-23 LAB — URINALYSIS, ROUTINE W REFLEX MICROSCOPIC
BILIRUBIN URINE: NEGATIVE
GLUCOSE, UA: NEGATIVE mg/dL
HGB URINE DIPSTICK: NEGATIVE
Ketones, ur: NEGATIVE mg/dL
Leukocytes, UA: NEGATIVE
Nitrite: NEGATIVE
PH: 7 (ref 5.0–8.0)
Protein, ur: NEGATIVE mg/dL
SPECIFIC GRAVITY, URINE: 1.004 — AB (ref 1.005–1.030)

## 2017-09-23 MED ORDER — CYCLOBENZAPRINE HCL 10 MG PO TABS
5.0000 mg | ORAL_TABLET | Freq: Once | ORAL | Status: AC
  Administered 2017-09-23: 5 mg via ORAL
  Filled 2017-09-23: qty 1

## 2017-09-23 MED ORDER — METHYLPREDNISOLONE 4 MG PO TBPK: ORAL_TABLET | ORAL | 0 refills | Status: DC

## 2017-09-23 MED ORDER — KETOROLAC TROMETHAMINE 30 MG/ML IJ SOLN
30.0000 mg | Freq: Once | INTRAMUSCULAR | Status: AC
  Administered 2017-09-23: 30 mg via INTRAMUSCULAR
  Filled 2017-09-23: qty 1

## 2017-09-23 MED ORDER — GABAPENTIN 300 MG PO CAPS
300.0000 mg | ORAL_CAPSULE | Freq: Once | ORAL | Status: AC
  Administered 2017-09-23: 300 mg via ORAL
  Filled 2017-09-23: qty 1

## 2017-09-23 MED ORDER — GABAPENTIN 300 MG PO CAPS: 300.0000 mg | ORAL_CAPSULE | Freq: Three times a day (TID) | ORAL | 0 refills | Status: DC

## 2017-09-23 NOTE — Discharge Instructions (Addendum)
You were seen today for lower back pain.  You likely have some nerve impingement from your recent injury.  Take medications as directed.  Follow-up with your primary physician if not improving.

## 2017-09-23 NOTE — ED Provider Notes (Signed)
[facility] EMERGENCY DEPARTMENT Provider Note   CSN: [id number] Arrival date & time: 09/23/17  0422     History   Chief Complaint Chief Complaint  Patient presents with  . Fall    HPI Elizabeth Bullock is a 52 y.o. female.  HPI  This is a 52 year old who presents with back pain.  Patient reports that she fell 1 week ago and was diagnosed with a coccyx contusion.  She has been taking medications at home as prescribed.  She had been doing well until tonight when she woke up and "I could barely move."  She reports lower back pain that radiates mostly into the right lower extremity.  She denies any weakness or numbness of the extremity.  She reports that ambulation makes pain worse.  She rates her pain at 10 out of 10.  She did not take anything for pain prior to arrival.  She reports frequent urination.  No difficulty urinating.  No difficulty with bowel movements.  No recent fevers or other injuries.  Chart reviewed from 1 week ago.  Patient had negative plain films after reported fall.  Treated with Decadron, Flexeril, and hydrocodone.  Past Medical History:  Diagnosis Date  . Bronchitis, chronic ([place])   . COPD (chronic obstructive pulmonary disease) ([place])   . Degenerative disorder of bone   . GERD (gastroesophageal reflux disease)   . Headache(784.0)    History of migraines,  takes propanolol for headaches  . High cholesterol   . Hypertension   . Tobacco abuse     Patient Active Problem List   Diagnosis Date Noted  . Nausea with vomiting 02/09/2015  . Pain in the chest   . Gastroenteritis   . Hypertension   . Tobacco abuse   . Chest pain 02/08/2015    Past Surgical History:  Procedure Laterality Date  . ABDOMINAL HYSTERECTOMY    . ANKLE SURGERY     left ankle surgery as teenager per pt  . CHOLECYSTECTOMY  03/13/2011   Procedure: LAPAROSCOPIC CHOLECYSTECTOMY;  Surgeon: Donato Heinz;  Location: [facility];  Service: General;  Laterality: N/A;     OB History    Gravida      Para      Term      Preterm      AB      Living  0     SAB      TAB      Ectopic      Multiple      Live Births               Home Medications    Prior to Admission medications   Medication Sig Start Date End Date Taking? Authorizing Provider  albuterol (PROAIR HFA) 108 (90 Base) MCG/ACT inhaler Inhale 1-2 puffs into the lungs every 6 (six) hours as needed for wheezing or shortness of breath.   Yes [provider]  HYDROcodone-acetaminophen (NORCO/VICODIN) 5-325 MG tablet Take 1 tablet by mouth every 4 (four) hours as needed. 09/15/17  Yes Lily Kocher, PA-C  nitroGLYCERIN (NITROSTAT) 0.4 MG SL tablet Place 1 tablet (0.4 mg total) under the tongue every 5 (five) minutes as needed for chest pain. 10/16/15  Yes Rancour, Annie Main, MD  cyclobenzaprine (FLEXERIL) 10 MG tablet Take 1 tablet (10 mg total) by mouth 3 (three) times daily. 09/15/17   Lily Kocher, PA-C  dexamethasone (DECADRON) 4 MG tablet Take 1 tablet (4 mg total) by mouth 2 (two) times daily with a meal.  09/15/17   Lily Kocher, PA-C  gabapentin (NEURONTIN) 300 MG capsule Take 1 capsule (300 mg total) by mouth 3 (three) times daily. 09/23/17   Niveah Boerner, Barbette Hair, MD  lisinopril (PRINIVIL,ZESTRIL) 20 MG tablet Take 20 mg by mouth daily.    [provider]  loratadine (CLARITIN) 10 MG tablet Take 10 mg by mouth daily.    [provider]  lovastatin (MEVACOR) 20 MG tablet Take 20 mg by mouth every evening.    [provider]  methylPREDNISolone (MEDROL DOSEPAK) 4 MG TBPK tablet Take as directed on packet 09/23/17   Lexie Morini, Barbette Hair, MD  omeprazole (PRILOSEC) 20 MG capsule Take 1 capsule (20 mg total) by mouth daily. 10/16/15   Rancour, Annie Main, MD  ranitidine (ZANTAC) 150 MG tablet Take 150 mg by mouth 2 (two) times daily.    [provider]  vitamin C (ASCORBIC ACID) 500 MG tablet Take 500 mg by mouth daily.    [provider]    Family  History No family history on file.  Social History Social History   Tobacco Use  . Smoking status: Current Every Day Smoker    Packs/day: 0.50    Types: Cigarettes  . Smokeless tobacco: Never Used  Substance Use Topics  . Alcohol use: No  . Drug use: Yes    Types: Marijuana, Other-see comments    Comment: last use 2 days ago     Allergies   Codeine; Morphine and related; and Penicillins   Review of Systems Review of Systems  Constitutional: Negative for fever.  Respiratory: Negative for shortness of breath.   Cardiovascular: Negative for chest pain.  Genitourinary: Positive for frequency. Negative for difficulty urinating and dysuria.  Musculoskeletal: Positive for back pain.  Neurological: Negative for weakness and numbness.  All other systems reviewed and are negative.    Physical Exam Updated Vital Signs BP (!) 147/86   Pulse 99   Temp 97.8 F (36.6 C)   Resp 20   Ht 5\' 4"  (1.626 m)   Wt 72.6 kg (160 lb)   SpO2 100%   BMI 27.46 kg/m   Physical Exam  Constitutional: She is oriented to person, place, and time. She appears well-developed and well-nourished. No distress.  HENT:  Head: Normocephalic and atraumatic.  Cardiovascular: Normal rate, regular rhythm and normal heart sounds.  Pulmonary/Chest: Effort normal and breath sounds normal. No respiratory distress. She has no wheezes.  Abdominal: Soft. There is no tenderness.  Musculoskeletal:  Tenderness to palpation over the sacrum, no fluctuance or induration noted, no overlying skin changes  Neurological: She is alert and oriented to person, place, and time.  Positive right straight leg raise, 5 out of 5 strength of hip flexors, knee extension, dorsi and plantar flexion bilaterally, 2+ patellar reflexes bilaterally, no clonus  Skin: Skin is warm and dry.  Psychiatric: She has a normal mood and affect.  Nursing note and vitals reviewed.    ED Treatments / Results  Labs (all labs ordered are listed,  but only abnormal results are displayed) Labs Reviewed  URINALYSIS, ROUTINE W REFLEX MICROSCOPIC - Abnormal; Notable for the following components:      Result Value   Color, Urine STRAW (*)    Specific Gravity, Urine 1.004 (*)    All other components within normal limits    EKG None  Radiology No results found.  Procedures Procedures (including critical care time)  Medications Ordered in ED Medications  ketorolac (TORADOL) 30 MG/ML injection 30 mg (30  mg Intramuscular Given 09/23/17 0459)  cyclobenzaprine (FLEXERIL) tablet 5 mg (5 mg Oral Given 09/23/17 0459)  gabapentin (NEURONTIN) capsule 300 mg (300 mg Oral Given 09/23/17 0459)     Initial Impression / Assessment and Plan / ED Course  I have reviewed the triage vital signs and the nursing notes.  Pertinent labs & imaging results that were available during my care of the patient were reviewed by me and considered in my medical decision making (see chart for details).     She presents with worsening low back pain radiating right leg.  Reports recent injury.  X-rays reviewed from prior visit.  History and physical exam most consistent with sciatica.  She appears neurologically intact.  She does report some urinary frequency.  She was able to urinate spontaneously.  Postvoid residual 67.  Patient was given Toradol, Flexeril, and Neurontin.  On recheck, she is ambulatory and reports improvement of pain.  We will treat supportively.  No signs or symptoms of cauda equina at this time.  After history, exam, and medical workup I feel the patient has been appropriately medically screened and is safe for discharge home. Pertinent diagnoses were discussed with the patient. Patient was given return precautions.   Final Clinical Impressions(s) / ED Diagnoses   Final diagnoses:  Sciatica of right side    ED Discharge Orders        Ordered    gabapentin (NEURONTIN) 300 MG capsule  3 times daily     09/23/17 0612    methylPREDNISolone  (MEDROL DOSEPAK) 4 MG TBPK tablet     09/23/17 0612       Merryl Hacker, MD 09/23/17 [phone]

## 2017-09-23 NOTE — ED Triage Notes (Signed)
Pt fell 2 weeks ago and bruised her buttocks. Pt states she woke up today with severe pain and states she can hardly walk.

## 2018-12-08 ENCOUNTER — Encounter (HOSPITAL_COMMUNITY): Payer: Self-pay

## 2018-12-08 ENCOUNTER — Emergency Department (HOSPITAL_COMMUNITY): Payer: Self-pay

## 2018-12-08 ENCOUNTER — Emergency Department (HOSPITAL_COMMUNITY)
Admission: EM | Admit: 2018-12-08 | Discharge: 2018-12-08 | Disposition: A | Discharge status: Home / Self Care | Payer: Self-pay | Attending: Emergency Medicine | Admitting: Emergency Medicine

## 2018-12-08 ENCOUNTER — Other Ambulatory Visit: Payer: Self-pay

## 2018-12-08 DIAGNOSIS — Y9301 Activity, walking, marching and hiking: Secondary | ICD-10-CM | POA: Insufficient documentation

## 2018-12-08 DIAGNOSIS — S6991XA Unspecified injury of right wrist, hand and finger(s), initial encounter: Secondary | ICD-10-CM

## 2018-12-08 DIAGNOSIS — I10 Essential (primary) hypertension: Secondary | ICD-10-CM | POA: Insufficient documentation

## 2018-12-08 DIAGNOSIS — Y999 Unspecified external cause status: Secondary | ICD-10-CM | POA: Insufficient documentation

## 2018-12-08 DIAGNOSIS — W108XXA Fall (on) (from) other stairs and steps, initial encounter: Secondary | ICD-10-CM | POA: Insufficient documentation

## 2018-12-08 DIAGNOSIS — J449 Chronic obstructive pulmonary disease, unspecified: Secondary | ICD-10-CM | POA: Insufficient documentation

## 2018-12-08 DIAGNOSIS — T148XXA Other injury of unspecified body region, initial encounter: Secondary | ICD-10-CM

## 2018-12-08 DIAGNOSIS — Y929 Unspecified place or not applicable: Secondary | ICD-10-CM | POA: Insufficient documentation

## 2018-12-08 DIAGNOSIS — S63601A Unspecified sprain of right thumb, initial encounter: Secondary | ICD-10-CM | POA: Insufficient documentation

## 2018-12-08 DIAGNOSIS — F17210 Nicotine dependence, cigarettes, uncomplicated: Secondary | ICD-10-CM | POA: Insufficient documentation

## 2018-12-08 MED ORDER — ACETAMINOPHEN 500 MG PO TABS
1000.0000 mg | ORAL_TABLET | Freq: Once | ORAL | Status: AC
  Administered 2018-12-08: 1000 mg via ORAL
  Filled 2018-12-08: qty 2

## 2018-12-08 NOTE — ED Provider Notes (Addendum)
[facility] Emergency Department Provider Note MRN:  [id number]  Arrival date & time: 12/08/18     Chief Complaint   Hand Injury (Right)   History of Present Illness   Elizabeth Bullock is a 53 y.o. year-old female with a history of COPD presenting to the ED with chief complaint of hand pain.  Patient was walking up the steps when 1 of the wooden steps broke, causing her to fall forward.  Fall onto outstretched hand.  Endorsing pain to the right forearm, wrist, base of the right thumb.  Pain is moderate in severity, constant, worse with palpation or motion.  Endorsing paresthesia or sensation of the hand being "asleep".  Denies head trauma, no loss consciousness, no other injuries or complaints.  Review of Systems  A complete 10 system review of systems was obtained and all systems are negative except as noted in the HPI and PMH.   Patient's Health History    Past Medical History:  Diagnosis Date  . Bronchitis, chronic ([place])   . COPD (chronic obstructive pulmonary disease) ([place])   . Degenerative disorder of bone   . GERD (gastroesophageal reflux disease)   . Headache(784.0)    History of migraines,  takes propanolol for headaches  . High cholesterol   . Hypertension   . Tobacco abuse     Past Surgical History:  Procedure Laterality Date  . ABDOMINAL HYSTERECTOMY    . ANKLE SURGERY     left ankle surgery as teenager per pt  . CHOLECYSTECTOMY  03/13/2011   Procedure: LAPAROSCOPIC CHOLECYSTECTOMY;  Surgeon: Donato Heinz;  Location: [facility];  Service: General;  Laterality: N/A;    History reviewed. No pertinent family history.  Social History   Socioeconomic History  . Marital status: Divorced    Spouse name: Not on file  . Number of children: Not on file  . Years of education: Not on file  . Highest education level: Not on file  Occupational History  . Not on file  Social Needs  . Financial resource strain: Not on file  . Food insecurity:    Worry:  Not on file    Inability: Not on file  . Transportation needs:    Medical: Not on file    Non-medical: Not on file  Tobacco Use  . Smoking status: Current Every Day Smoker    Packs/day: 0.50    Types: Cigarettes  . Smokeless tobacco: Never Used  Substance and Sexual Activity  . Alcohol use: No  . Drug use: Yes    Types: Marijuana, Other-see comments    Comment: last use 2 days ago  . Sexual activity: Yes    Birth control/protection: Surgical  Lifestyle  . Physical activity:    Days per week: Not on file    Minutes per session: Not on file  . Stress: Not on file  Relationships  . Social connections:    Talks on phone: Not on file    Gets together: Not on file    Attends religious service: Not on file    Active member of club or organization: Not on file    Attends meetings of clubs or organizations: Not on file    Relationship status: Not on file  . Intimate partner violence:    Fear of current or ex partner: Not on file    Emotionally abused: Not on file    Physically abused: Not on file    Forced sexual activity: Not on file  Other  Topics Concern  . Not on file  Social History Narrative  . Not on file     Physical Exam  Vital Signs and Nursing Notes reviewed Vitals:   12/08/18 0642  BP: (!) 147/97  Pulse: 76  Resp: 18  Temp: 97.9 F (36.6 C)  SpO2: 99%    CONSTITUTIONAL: Well-appearing, NAD NEURO:  Alert and oriented x 3, no focal deficits EYES:  eyes equal and reactive ENT/NECK:  no LAD, no JVD CARDIO: Regular rate, well-perfused, normal S1 and S2 PULM:  CTAB no wheezing or rhonchi GI/GU:  normal bowel sounds, non-distended, non-tender MSK/SPINE: Bruising and mild edema to the right thenar eminence, tenderness to palpation to the base of the right thumb, thenar eminence, distal radius, as well as snuffbox tenderness SKIN:  no rash, atraumatic PSYCH:  Appropriate speech and behavior  Diagnostic and Interventional Summary    EKG Interpretation   Date/Time:    Ventricular Rate:    PR Interval:    QRS Duration:   QT Interval:    QTC Calculation:   R Axis:     Text Interpretation:        Labs Reviewed - No data to display  DG Hand Complete Right  Final Result    DG Wrist Complete Right  Final Result    DG Forearm Right  Final Result      Medications  acetaminophen (TYLENOL) tablet 1,000 mg (1,000 mg Oral Given 12/08/18 0717)     Procedures Critical Care  ED Course and Medical Decision Making  I have reviewed the triage vital signs and the nursing notes.  Pertinent labs & imaging results that were available during my care of the patient were reviewed by me and considered in my medical decision making (see below for details).  X-ray to evaluate for possible distal radius fracture.  Patient also has snuffbox tenderness and therefore will need spica splint even if x-rays are negative.  Hand is neurovascularly intact, normal strength and sensory exam.  Normal pulses.  Normal cap refill.  X-rays negative, thumb spica to be applied prior to discharge.  Advised orthopedic follow-up within the next 2 weeks for evaluation and/or repeat x-rays.  After the discussed management above, the patient was determined to be safe for discharge.  The patient was in agreement with this plan and all questions regarding their care were answered.  ED return precautions were discussed and the patient will return to the ED with any significant worsening of condition.  Barth Kirks. Sedonia Small, MD [place] [email or web address]  Final Clinical Impressions(s) / ED Diagnoses     ICD-10-CM   1. Injury of right hand, initial encounter S69.91XA   2. Sprain [email or web address]     ED Discharge Orders    None         Maudie Flakes, MD 12/08/18 [id number]    Maudie Flakes, MD 12/08/18 [phone]

## 2018-12-08 NOTE — Discharge Instructions (Addendum)
You were evaluated in the Emergency Department and after careful evaluation, we did not find any emergent condition requiring admission or further testing in the hospital.  Your symptoms today seem to be due to a sprain of the wrist or hand.  However, there is still a chance that you have a broken bone in the wrist that we just cannot see on x-ray today.  Please keep the splint on at all times, keep the splint clean and dry, and follow-up with the orthopedic specialist for evaluation or repeat x-rays within the next 2 weeks.  Please return to the Emergency Department if you experience any worsening of your condition.  We encourage you to follow up with a primary care provider.  Thank you for allowing [place] to be a part of your care.

## 2018-12-08 NOTE — ED Triage Notes (Addendum)
Pt was walking down steps yesterday afternoon when the stair broke.pt injured right  hand during fall. Reports tingling in wrist. Unable to wiggle thumb. 7/10 pain . Slight bruising on wrist. No swelling or deformity

## 2018-12-14 ENCOUNTER — Telehealth: Payer: Self-pay | Admitting: Physician Assistant

## 2018-12-14 NOTE — Telephone Encounter (Signed)
Faxed over pt. [employer] application to [employer], also pt. [id number], Letter of support and Zero Income. Pt. Will be going to the [facility] as there Med. Home.  -Drema Halon

## 2018-12-22 ENCOUNTER — Telehealth: Payer: Self-pay | Admitting: Physician Assistant

## 2018-12-22 NOTE — Telephone Encounter (Signed)
Faxed over [place] application to [place].   Drema Halon

## 2018-12-23 ENCOUNTER — Ambulatory Visit (HOSPITAL_COMMUNITY)
Admission: RE | Admit: 2018-12-23 | Discharge: 2018-12-23 | Disposition: A | Discharge status: Home / Self Care | Payer: Self-pay | Source: Ambulatory Visit | Attending: Physician Assistant | Admitting: Physician Assistant

## 2018-12-23 ENCOUNTER — Other Ambulatory Visit: Payer: Self-pay

## 2018-12-23 ENCOUNTER — Encounter: Payer: Self-pay | Admitting: Physician Assistant

## 2018-12-23 ENCOUNTER — Other Ambulatory Visit (HOSPITAL_COMMUNITY)
Admission: RE | Admit: 2018-12-23 | Discharge: 2018-12-23 | Disposition: A | Discharge status: Home / Self Care | Payer: Self-pay | Source: Ambulatory Visit | Attending: Physician Assistant | Admitting: Physician Assistant

## 2018-12-23 ENCOUNTER — Ambulatory Visit: Payer: Self-pay | Admitting: Physician Assistant

## 2018-12-23 VITALS — BP 110/76 | HR 79 | Temp 97.5°F

## 2018-12-23 DIAGNOSIS — Z8639 Personal history of other endocrine, nutritional and metabolic disease: Secondary | ICD-10-CM

## 2018-12-23 DIAGNOSIS — M545 Low back pain, unspecified: Secondary | ICD-10-CM

## 2018-12-23 DIAGNOSIS — Z833 Family history of diabetes mellitus: Secondary | ICD-10-CM

## 2018-12-23 DIAGNOSIS — Z1211 Encounter for screening for malignant neoplasm of colon: Secondary | ICD-10-CM

## 2018-12-23 DIAGNOSIS — Z7689 Persons encountering health services in other specified circumstances: Secondary | ICD-10-CM

## 2018-12-23 DIAGNOSIS — Z131 Encounter for screening for diabetes mellitus: Secondary | ICD-10-CM | POA: Insufficient documentation

## 2018-12-23 DIAGNOSIS — K219 Gastro-esophageal reflux disease without esophagitis: Secondary | ICD-10-CM

## 2018-12-23 DIAGNOSIS — F17200 Nicotine dependence, unspecified, uncomplicated: Secondary | ICD-10-CM

## 2018-12-23 DIAGNOSIS — Z1239 Encounter for other screening for malignant neoplasm of breast: Secondary | ICD-10-CM

## 2018-12-23 LAB — COMPREHENSIVE METABOLIC PANEL
ALT: 17 U/L (ref 0–44)
AST: 16 U/L (ref 15–41)
Albumin: 4.2 g/dL (ref 3.5–5.0)
Alkaline Phosphatase: 86 U/L (ref 38–126)
Anion gap: 14 (ref 5–15)
BUN: 10 mg/dL (ref 6–20)
CO2: 23 mmol/L (ref 22–32)
Calcium: 9.4 mg/dL (ref 8.9–10.3)
Chloride: 102 mmol/L (ref 98–111)
Creatinine, Ser: 0.8 mg/dL (ref 0.44–1.00)
GFR calc Af Amer: >60 mL/min (ref >60)
GFR calc non Af Amer: >60 mL/min (ref >60)
Glucose, Bld: 100 mg/dL — ABNORMAL HIGH (ref 70–99)
Potassium: 4.2 mmol/L (ref 3.5–5.1)
Sodium: 139 mmol/L (ref 135–145)
Total Bilirubin: 0.7 mg/dL (ref 0.3–1.2)
Total Protein: 7.1 g/dL (ref 6.5–8.1)

## 2018-12-23 LAB — HEMOGLOBIN A1C
Hgb A1c MFr Bld: 5.3 % (ref 4.8–5.6)
Mean Plasma Glucose: 105.41 mg/dL

## 2018-12-23 LAB — LIPID PANEL
Cholesterol: 269 mg/dL — ABNORMAL HIGH (ref 0–200)
HDL: 37 mg/dL — ABNORMAL LOW (ref >40)
LDL Cholesterol: 184 mg/dL — ABNORMAL HIGH (ref 0–99)
Total CHOL/HDL Ratio: 7.3 RATIO
Triglycerides: 239 mg/dL — ABNORMAL HIGH (ref <150)
VLDL: 48 mg/dL — ABNORMAL HIGH (ref 0–40)

## 2018-12-23 NOTE — Progress Notes (Signed)
BP 110/76   Pulse 79   Temp (!) 97.5 F (36.4 C)   SpO2 99%    Subjective:    Patient ID: Elizabeth Bullock, female    DOB: 08-24-1965, 53 y.o.   MRN: [id number]  HPI: Elizabeth Bullock is a 53 y.o. female presenting on 12/23/2018 for New Patient (Initial Visit) ([facility] in [place] but hasn't been in about 2 years)   HPI   Chief Complaint  Patient presents with  . New Patient (Initial Visit)    [facility] in [place] but hasn't been in about 2 years    Pt had negative screeningquestionnairefor CV19  Pt has Hx hyperlipidmeia-  She has had No rx for this for 2 year  She complains of LBP.  She says it has been present for years but worse with her recent fall- fall on 12/08/18.  No pain in legs.  Just the back  Relevant past medical, surgical, family and social history reviewed and updated as indicated. Interim medical history since our last visit reviewed. Allergies and medications reviewed and updated.   Current Outpatient Medications:  .  APPLE CIDER VINEGAR PO, Take 1 tablet by mouth daily., Disp: , Rfl:  .  loratadine (CLARITIN) 10 MG tablet, Take 10 mg by mouth daily., Disp: , Rfl:  .  omeprazole (PRILOSEC) 20 MG capsule, Take 1 capsule (20 mg total) by mouth daily., Disp: 30 capsule, Rfl: 0 .  vitamin C (ASCORBIC ACID) 500 MG tablet, Take 500 mg by mouth daily., Disp: , Rfl:     Review of Systems  Per HPI unless specifically indicated above     Objective:    BP 110/76   Pulse 79   Temp (!) 97.5 F (36.4 C)   SpO2 99%   Wt Readings from Last 3 Encounters:  12/08/18 175 lb (79.4 kg)  09/23/17 160 lb (72.6 kg)  09/14/17 160 lb (72.6 kg)    Physical Exam Vitals signs reviewed.  Constitutional:      Appearance: She is well-developed.  HENT:     Head: Normocephalic and atraumatic.  Eyes:     Conjunctiva/sclera: Conjunctivae normal.     Pupils: Pupils are equal, round, and reactive to light.   Neck:     Musculoskeletal: Neck supple.     Thyroid: No thyromegaly.  Cardiovascular:     Rate and Rhythm: Normal rate and regular rhythm.  Pulmonary:     Effort: Pulmonary effort is normal.     Breath sounds: Normal breath sounds.  Abdominal:     General: Bowel sounds are normal.     Palpations: Abdomen is soft. There is no mass.     Tenderness: There is no abdominal tenderness.  Musculoskeletal:     Lumbar back: She exhibits tenderness. She exhibits normal range of motion, no bony tenderness, no swelling, no edema and no deformity.     Right lower leg: No edema.     Left lower leg: No edema.     Comments: SLR normal.  Mild soft tissue tenderness lumbar area  Lymphadenopathy:     Cervical: No cervical adenopathy.  Skin:    General: Skin is warm and dry.  Neurological:     Mental Status: She is alert and oriented to person, place, and time.     Gait: Gait normal.  Psychiatric:        Behavior: Behavior normal.  Assessment & Plan:    Encounter Diagnoses  Name Primary?  . Encounter to establish care Yes  . History of hyperlipidemia   . Screening for diabetes mellitus   . Family history of diabetes mellitus   . Midline low back pain without sciatica, unspecified chronicity   . Gastroesophageal reflux disease, esophagitis presence not specified   . Tobacco use disorder   . Screening for breast cancer   . Screening for colon cancer      -will refer for Screening mammogram -pt is given ifbot for colon cancer screening -will check baseline labs -will get xray lumbar spine -pt was given application for [facility] (for the xray) -counseled smoking cessation -encouraged pt to wear mask in public per CDC guidelines -pt to follow up 1 month with telemedicine visit to review results.  Pt is to contact office sooner prn

## 2018-12-25 ENCOUNTER — Other Ambulatory Visit: Payer: Self-pay | Admitting: Physician Assistant

## 2018-12-25 DIAGNOSIS — Z1211 Encounter for screening for malignant neoplasm of colon: Secondary | ICD-10-CM

## 2018-12-28 LAB — IFOBT (OCCULT BLOOD): IFOBT: NEGATIVE

## 2018-12-29 ENCOUNTER — Other Ambulatory Visit: Payer: Self-pay | Admitting: Physician Assistant

## 2018-12-29 DIAGNOSIS — Z1239 Encounter for other screening for malignant neoplasm of breast: Secondary | ICD-10-CM

## 2018-12-29 DIAGNOSIS — Z1211 Encounter for screening for malignant neoplasm of colon: Secondary | ICD-10-CM

## 2019-01-12 ENCOUNTER — Ambulatory Visit (HOSPITAL_COMMUNITY)
Admission: RE | Admit: 2019-01-12 | Discharge: 2019-01-12 | Disposition: A | Discharge status: Home / Self Care | Payer: Self-pay | Source: Ambulatory Visit | Attending: Physician Assistant | Admitting: Physician Assistant

## 2019-01-12 ENCOUNTER — Other Ambulatory Visit: Payer: Self-pay

## 2019-01-12 DIAGNOSIS — Z1239 Encounter for other screening for malignant neoplasm of breast: Secondary | ICD-10-CM | POA: Insufficient documentation

## 2019-01-13 ENCOUNTER — Other Ambulatory Visit (HOSPITAL_COMMUNITY): Payer: Self-pay | Admitting: Physician Assistant

## 2019-01-13 DIAGNOSIS — R928 Other abnormal and inconclusive findings on diagnostic imaging of breast: Secondary | ICD-10-CM

## 2019-01-24 ENCOUNTER — Encounter: Payer: Self-pay | Admitting: Physician Assistant

## 2019-01-24 ENCOUNTER — Ambulatory Visit: Payer: Medicaid Other | Admitting: Physician Assistant

## 2019-01-24 DIAGNOSIS — E785 Hyperlipidemia, unspecified: Secondary | ICD-10-CM

## 2019-01-24 DIAGNOSIS — R928 Other abnormal and inconclusive findings on diagnostic imaging of breast: Secondary | ICD-10-CM

## 2019-01-24 DIAGNOSIS — F39 Unspecified mood [affective] disorder: Secondary | ICD-10-CM

## 2019-01-24 DIAGNOSIS — F17200 Nicotine dependence, unspecified, uncomplicated: Secondary | ICD-10-CM

## 2019-01-24 DIAGNOSIS — M545 Low back pain, unspecified: Secondary | ICD-10-CM

## 2019-01-24 DIAGNOSIS — K219 Gastro-esophageal reflux disease without esophagitis: Secondary | ICD-10-CM

## 2019-01-24 MED ORDER — ATORVASTATIN CALCIUM 20 MG PO TABS: 20.0000 mg | ORAL_TABLET | Freq: Every day | ORAL | 1 refills | Status: AC

## 2019-01-24 NOTE — Patient Instructions (Signed)

## 2019-01-24 NOTE — Progress Notes (Signed)
There were no vitals taken for this visit.   Subjective:    Patient ID: Elizabeth Bullock, female    DOB: April 22, 1966, 53 y.o.   MRN: [id number]  HPI: Elizabeth Bullock is a 53 y.o. female presenting on 01/24/2019 for Follow-up   HPI   This is a telemedicine appointment through [employer] due to coronavirus pandemic  I connected with  Elizabeth Bullock on 01/24/19 by a video enabled telemedicine application and verified that I am speaking with the correct person using two identifiers.   I discussed the limitations of evaluation and management by telemedicine. The patient expressed understanding and agreed to proceed.  Pt is at home.  Provider is at office.     She says her back pain has impvoed from when she was in the office but is still spasming at times.   She turned in [facility] application  Pt smokes 1-2 ppd  She has appointment tomorrow to get follow up imaging after abnormal mammogram.  Pt says she has a lot of stress.  Relevant past medical, surgical, family and social history reviewed and updated as indicated. Interim medical history since our last visit reviewed. Allergies and medications reviewed and updated.   Current Outpatient Medications:  .  APPLE CIDER VINEGAR PO, Take 1 tablet by mouth daily., Disp: , Rfl:  .  loratadine (CLARITIN) 10 MG tablet, Take 10 mg by mouth daily., Disp: , Rfl:  .  omeprazole (PRILOSEC) 20 MG capsule, Take 1 capsule (20 mg total) by mouth daily., Disp: 30 capsule, Rfl: 0 .  vitamin C (ASCORBIC ACID) 500 MG tablet, Take 500 mg by mouth daily., Disp: , Rfl:     Review of Systems  Per HPI unless specifically indicated above     Objective:    There were no vitals taken for this visit.  Wt Readings from Last 3 Encounters:  12/08/18 175 lb (79.4 kg)  09/23/17 160 lb (72.6 kg)  09/14/17 160 lb (72.6 kg)    Physical Exam Constitutional:      General: She is not in acute distress.    Appearance: Normal appearance. She is not  ill-appearing.  HENT:     Head: Normocephalic and atraumatic.  Pulmonary:     Effort: Pulmonary effort is normal. No respiratory distress.  Neurological:     Mental Status: She is alert and oriented to person, place, and time.  Psychiatric:        Attention and Perception: Attention normal.        Mood and Affect: Mood is anxious.        Speech: Speech normal.        Behavior: Behavior is cooperative.     Results for orders placed or performed in visit on 12/25/18  IFOBT POC (occult bld, rslt in office)  Result Value Ref Range   IFOBT Negative       Assessment & Plan:     Encounter Diagnoses  Name Primary?  . Hyperlipidemia, unspecified hyperlipidemia type Yes  . Tobacco use disorder   . Abnormal mammogram   . Midline low back pain without sciatica, unspecified chronicity   . Gastroesophageal reflux disease, esophagitis presence not specified   . Mood disorder ([place])      -Reviewed labs and images with pt -recommended Lowfat diet and atorvastatin for dyslipidemia.  rx sent to [facility] -recommended pt [place] Heat to back. Do exercises to help the back.  Handouts will be mailed to patient -pt Declined [facility] for counseling when  offered contact information to get help with her "stress" -encouraged smoking cessation -pt to follow up 3 months.  She is to contact office sooner prn

## 2019-01-25 ENCOUNTER — Ambulatory Visit (HOSPITAL_COMMUNITY): Payer: Self-pay

## 2019-01-25 ENCOUNTER — Inpatient Hospital Stay (HOSPITAL_COMMUNITY): Admission: RE | Admit: 2019-01-25 | Payer: Self-pay | Source: Ambulatory Visit

## 2019-02-01 ENCOUNTER — Other Ambulatory Visit: Payer: Self-pay

## 2019-02-01 ENCOUNTER — Other Ambulatory Visit (HOSPITAL_COMMUNITY): Payer: Self-pay | Admitting: Physician Assistant

## 2019-02-01 ENCOUNTER — Ambulatory Visit (HOSPITAL_COMMUNITY)
Admission: RE | Admit: 2019-02-01 | Discharge: 2019-02-01 | Disposition: A | Discharge status: Home / Self Care | Payer: Self-pay | Source: Ambulatory Visit | Attending: Physician Assistant | Admitting: Physician Assistant

## 2019-02-01 DIAGNOSIS — R928 Other abnormal and inconclusive findings on diagnostic imaging of breast: Secondary | ICD-10-CM

## 2019-02-08 ENCOUNTER — Encounter (HOSPITAL_COMMUNITY): Payer: Self-pay

## 2019-02-08 ENCOUNTER — Other Ambulatory Visit: Payer: Self-pay | Admitting: Obstetrics and Gynecology

## 2019-02-08 ENCOUNTER — Other Ambulatory Visit: Payer: Self-pay

## 2019-02-08 ENCOUNTER — Ambulatory Visit (HOSPITAL_COMMUNITY)
Admission: RE | Admit: 2019-02-08 | Discharge: 2019-02-08 | Disposition: A | Discharge status: Home / Self Care | Payer: Medicaid Other | Source: Ambulatory Visit | Attending: Obstetrics and Gynecology | Admitting: Obstetrics and Gynecology

## 2019-02-08 DIAGNOSIS — N63 Unspecified lump in unspecified breast: Secondary | ICD-10-CM

## 2019-02-08 DIAGNOSIS — Z1239 Encounter for other screening for malignant neoplasm of breast: Secondary | ICD-10-CM

## 2019-02-08 NOTE — Patient Instructions (Signed)
Explained breast self awareness with Jarrett Soho. Patient did not need a Pap smear today due to patient has a history of a hysterectomy for benign reasons. Let patient know that she doesn't need any further Pap smears due to her history of a hysterectomy for benign reasons. Referred patient to the [facility] for a left breast biopsy per recommendation. Appointment scheduled for Wednesday, February 08, 2019 at 1245. Patient aware of appointment and will be there. Discussed smoking cessation with patient. Referred to the [facility] Quitline and gave resources to the free smoking cessation classes at [facility]. Jarrett Soho verbalized understanding.  Brannock, Arvil Chaco, RN 8:32 AM

## 2019-02-08 NOTE — Progress Notes (Signed)
Patient referred to [facility] by her PCP due to a left breast biopsy is recommended. Patient had a screening mammogram completed 01/12/2019 that additional imaging of the left breast was recommended and a left breast diagnostic 02/01/2019 that a left breast ultrasound core needle biopsy recommended. All imaging was completed at the [facility]. Patient complained of left breast pain x 3 days that comes and goes. Patient rates the pain at a 3.5 out of 10.  Pap Smear: Pap smear not completed today. Last Pap smear was 3.5 years ago at [facility] and normal per patient. Per patient has no history of an abnormal Pap smear. Patient has a history of a hysterectomy 20 years ago due to fibroids. Patient doesn't need any further Pap smears due to her history of a hysterectomy for benign reasons per BCCCP and ACOG guidelines. No Pap smear results are in Epic.  Physical exam: Breasts Breasts symmetrical. No skin abnormalities bilateral breasts. No nipple retraction bilateral breasts. No nipple discharge bilateral breasts. No lymphadenopathy. No lumps palpated bilateral breasts. Complaints of left outer breast tenderness on exam. Referred patient to the [facility] for a left breast biopsy per recommendation. Appointment scheduled for Wednesday, February 08, 2019 at 1245.        Pelvic/Bimanual No Pap smear completed today since patient has a history of a hysterectomy for benign reasons. Pap smear not indicated per BCCCP guidelines.   Smoking History: Patient is a current smoker. Discussed smoking cessation with patient. Referred to the [facility] Quitline and gave resources to the free smoking cessation classes at [facility].  Patient Navigation: Patient education provided. Access to services provided for patient through [place] program.    Colorectal Cancer Screening: Per patient has never had a colonoscopy completed. Per patient completed a FIT Test one month ago that  was negative. No complaints today.   Breast and Cervical Cancer Risk Assessment: Patient has a family history of her maternal great grandmother having breast cancer. Patient has no known genetic mutations or history of radiation treatment to the chest before age 60. Patient has no history of cervical dysplasia, immunocompromised, or DES exposure in-utero.  Risk Assessment    Risk Scores      02/08/2019   Last edited by: Armond Hang, LPN   5-year risk: 1.1 %   Lifetime risk: 9.7 %

## 2019-02-09 ENCOUNTER — Other Ambulatory Visit: Payer: Self-pay | Admitting: Obstetrics and Gynecology

## 2019-02-09 ENCOUNTER — Ambulatory Visit
Admission: RE | Admit: 2019-02-09 | Discharge: 2019-02-09 | Disposition: A | Discharge status: Home / Self Care | Payer: No Typology Code available for payment source | Source: Ambulatory Visit | Attending: Obstetrics and Gynecology | Admitting: Obstetrics and Gynecology

## 2019-02-09 DIAGNOSIS — N63 Unspecified lump in unspecified breast: Secondary | ICD-10-CM

## 2019-02-10 ENCOUNTER — Encounter: Payer: Self-pay | Admitting: *Deleted

## 2019-02-11 ENCOUNTER — Telehealth: Payer: Self-pay | Admitting: Hematology and Oncology

## 2019-02-11 ENCOUNTER — Other Ambulatory Visit: Payer: Self-pay | Admitting: *Deleted

## 2019-02-11 DIAGNOSIS — C50212 Malignant neoplasm of upper-inner quadrant of left female breast: Secondary | ICD-10-CM | POA: Insufficient documentation

## 2019-02-11 NOTE — Telephone Encounter (Signed)
Spoke with patient to confirm morning [facility] appointment for 8/19, packet mailed to patient

## 2019-02-14 ENCOUNTER — Encounter (HOSPITAL_COMMUNITY): Payer: Self-pay | Admitting: *Deleted

## 2019-02-15 NOTE — Progress Notes (Addendum)
[facility] NOTE  Patient Care Team: Soyla Dryer, PA-C as PCP - General (Physician Assistant) [name], RN as Oncology Nurse Navigator [name], RN as Oncology Nurse Navigator [name], MD as Consulting Physician (General Surgery) [name], MD as Consulting Physician (Hematology and Oncology) [name], MD as Consulting Physician (Radiation Oncology)  CHIEF COMPLAINTS/PURPOSE OF CONSULTATION:  Newly diagnosed breast cancer  HISTORY OF PRESENTING ILLNESS:  Elizabeth Bullock 53 y.o. female is here because of recent diagnosis of invasive ductal carcinoma of the left breast. The cancer was detected on a routine screening mammogram on 01/12/19. Diagnostic left mammogram on 02/01/19 showed a 47m mass with associated architectural distortion in the upper left breast with no axillary adenopathy. Biopsy on 02/09/19 showed invasive ductal carcinoma with DCIS, grade 1, HER-2 negative (0), ER 100%, PR 100%, Ki67 10%. She presents to the clinic today for initial evaluation and discussion of treatment options.   I reviewed her records extensively and collaborated the history with the patient.  SUMMARY OF ONCOLOGIC HISTORY: Oncology History  Malignant neoplasm of upper-inner quadrant of left breast in female, estrogen receptor positive ([place] [place]  02/11/2019 Initial Diagnosis   Routine screening mammogram detected a 486mmass with associated architectural distortion in the upper left breast, no axillary adenopathy. 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   02/16/2019 Cancer Staging   Staging form: Breast, AJCC 8th Edition - Clinical stage from 02/16/2019: Stage IA (cT1a, cN0, cM0, G2, ER+, PR+, HER2-) - Signed by GuNicholas LoseMD on 02/16/2019     MEDICAL HISTORY:  Past Medical History:  Diagnosis Date  . Anxiety   . Bipolar disorder ([facility]  . Bronchitis, chronic ([facility]  . COPD (chronic  obstructive pulmonary disease) ([facility]  . Degenerative disorder of bone    OF BACK  . Depression   . GERD (gastroesophageal reflux disease)   . Headache(784.0)    History of migraines,  takes propanolol for headaches  . High cholesterol   . Hypertension   . Tobacco abuse     SURGICAL HISTORY: Past Surgical History:  Procedure Laterality Date  . ABDOMINAL HYSTERECTOMY    . ANKLE SURGERY Left    left ankle surgery as teenager per pt  . CHOLECYSTECTOMY  03/13/2011   Procedure: LAPAROSCOPIC CHOLECYSTECTOMY;  Surgeon: BrDonato Heinz Location: [facility];  Service: General;  Laterality: N/A;    SOCIAL HISTORY: Social History   Socioeconomic History  . Marital status: Divorced    Spouse name: Not on file  . Number of children: 0  . Years of education: Not on file  . Highest education level: 9th grade  Occupational History  . Not on file  Social Needs  . Financial resource strain: Not on file  . Food insecurity    Worry: Not on file    Inability: Not on file  . Transportation needs    Medical: No    Non-medical: No  Tobacco Use  . Smoking status: Current Every Day Smoker    Packs/day: 1.50    Years: 39.00    Pack years: 58.50    Types: Cigarettes  . Smokeless tobacco: Never Used  Substance and Sexual Activity  . Alcohol use: No  . Drug use: Yes    Types: Marijuana    Comment: uses daily  . Sexual activity: Yes    Birth control/protection: Surgical  Lifestyle  . Physical activity  Days per week: Not on file    Minutes per session: Not on file  . Stress: Not on file  Relationships  . Social connections    Talks on phone: Not on file    Gets together: Not on file    Attends religious service: Not on file    Active member of club or organization: Not on file    Attends meetings of clubs or organizations: Not on file    Relationship status: Not on file  . Intimate partner violence    Fear of current or ex partner: Not on file    Emotionally abused: Not on file     Physically abused: Not on file    Forced sexual activity: Not on file  Other Topics Concern  . Not on file  Social History Narrative  . Not on file    FAMILY HISTORY: Family History  Problem Relation Age of Onset  . Hypertension Mother   . Diabetes Mother   . Asthma Mother   . Hypertension Father   . COPD Father   . Heart attack Father   . Hyperlipidemia Brother     ALLERGIES:  is allergic to codeine; morphine and related; and penicillins.  MEDICATIONS:  Current Outpatient Medications  Medication Sig Dispense Refill  . APPLE CIDER VINEGAR PO Take 1 tablet by mouth daily.    . atorvastatin (LIPITOR) 20 MG tablet Take 1 tablet (20 mg total) by mouth daily. 90 tablet 1  . loratadine (CLARITIN) 10 MG tablet Take 10 mg by mouth daily.    . omeprazole (PRILOSEC) 20 MG capsule Take 1 capsule (20 mg total) by mouth daily. 30 capsule 0  . vitamin C (ASCORBIC ACID) 500 MG tablet Take 500 mg by mouth daily.     No current facility-administered medications for this visit.     REVIEW OF SYSTEMS:   Constitutional: Denies fevers, chills or abnormal night sweats Eyes: Denies blurriness of vision, double vision or watery eyes Ears, nose, mouth, throat, and face: Denies mucositis or sore throat Respiratory: Denies cough, dyspnea or wheezes Cardiovascular: Denies palpitation, chest discomfort or lower extremity swelling Gastrointestinal:  Denies nausea, heartburn or change in bowel habits Skin: Denies abnormal skin rashes Lymphatics: Denies new lymphadenopathy or easy bruising Neurological:Denies numbness, tingling or new weaknesses Behavioral/Psych: Mood is stable, no new changes  Breast: Denies any palpable lumps or discharge All other systems were reviewed with the patient and are negative.  PHYSICAL EXAMINATION: ECOG PERFORMANCE STATUS: 0 - Asymptomatic  Vitals:   02/16/19 0856  BP: (!) 142/90  Pulse: 64  Temp: 98.7 F (37.1 C)  SpO2: 100%   Filed Weights   02/16/19  0856  Weight: 165 lb 11.2 oz (75.2 kg)    GENERAL:alert, no distress and comfortable SKIN: skin color, texture, turgor are normal, no rashes or significant lesions EYES: normal, conjunctiva are pink and non-injected, sclera clear OROPHARYNX:no exudate, no erythema and lips, buccal mucosa, and tongue normal  NECK: supple, thyroid normal size, non-tender, without nodularity LYMPH:  no palpable lymphadenopathy in the cervical, axillary or inguinal LUNGS: clear to auscultation and percussion with normal breathing effort HEART: regular rate & rhythm and no murmurs and no lower extremity edema ABDOMEN:abdomen soft, non-tender and normal bowel sounds Musculoskeletal:no cyanosis of digits and no clubbing  PSYCH: alert & oriented x 3 with fluent speech NEURO: no focal motor/sensory deficits BREAST: No palpable nodules in breast. No palpable axillary or supraclavicular lymphadenopathy (exam performed in the presence of a chaperone)     LABORATORY DATA:  I have reviewed the data as listed Lab Results  Component Value Date   WBC 9.6 02/16/2019   HGB 14.3 02/16/2019   HCT 43.2 02/16/2019   MCV 89.1 02/16/2019   PLT 251 02/16/2019   Lab Results  Component Value Date   NA 140 02/16/2019   K 3.5 02/16/2019   CL 103 02/16/2019   CO2 26 02/16/2019    RADIOGRAPHIC STUDIES: I have personally reviewed the radiological reports and agreed with the findings in the report.  ASSESSMENT AND PLAN:  Malignant neoplasm of upper-inner quadrant of left breast in female, estrogen receptor positive (H[facility]) 02/11/2019:Routine screening mammogram detected a 4mm mass with associated architectural distortion in the upper left breast, no axillary adenopathy. 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  T1 a N0 stage Ia  Pathology and radiology counseling:Discussed with the patient, the details of pathology including the type of breast cancer,the clinical  staging, the significance of ER, PR and HER-2/neu receptors and the implications for treatment. After reviewing the pathology in detail, we proceeded to discuss the different treatment options between surgery, radiation, chemotherapy, antiestrogen therapies.  Recommendations: 1. Breast conserving surgery followed by 2. Adjuvant radiation therapy followed by 3. Adjuvant antiestrogen therapy: We will need to check for FSH and estradiol levels before starting her antiestrogen therapy since she had a prior hysterectomy and does not know if she is truly in menopause.  Return to clinic after surgery to discuss final pathology report and finalize adjuvant treatment plan.   All questions were answered. The patient knows to call the clinic with any problems, questions or concerns.    K , MD 02/16/2019    I, Molly Dorshimer, am acting as scribe for  , MD.  I have reviewed the above documentation for accuracy and completeness, and I agree with the above.   

## 2019-02-16 ENCOUNTER — Other Ambulatory Visit (HOSPITAL_COMMUNITY): Payer: Self-pay | Admitting: Surgery

## 2019-02-16 ENCOUNTER — Ambulatory Visit: Payer: Medicaid Other | Attending: Surgery | Admitting: Physical Therapy

## 2019-02-16 ENCOUNTER — Inpatient Hospital Stay: Payer: Medicaid Other | Attending: Hematology and Oncology | Admitting: Hematology and Oncology

## 2019-02-16 ENCOUNTER — Inpatient Hospital Stay: Payer: Medicaid Other

## 2019-02-16 ENCOUNTER — Ambulatory Visit
Admission: RE | Admit: 2019-02-16 | Discharge: 2019-02-16 | Disposition: A | Discharge status: Home / Self Care | Payer: Self-pay | Source: Ambulatory Visit | Attending: Radiation Oncology | Admitting: Radiation Oncology

## 2019-02-16 ENCOUNTER — Encounter: Payer: Self-pay | Admitting: Physical Therapy

## 2019-02-16 ENCOUNTER — Other Ambulatory Visit: Payer: Self-pay

## 2019-02-16 ENCOUNTER — Encounter: Payer: Self-pay | Admitting: Hematology and Oncology

## 2019-02-16 DIAGNOSIS — C50212 Malignant neoplasm of upper-inner quadrant of left female breast: Secondary | ICD-10-CM | POA: Diagnosis not present

## 2019-02-16 DIAGNOSIS — F419 Anxiety disorder, unspecified: Secondary | ICD-10-CM | POA: Insufficient documentation

## 2019-02-16 DIAGNOSIS — E7800 Pure hypercholesterolemia, unspecified: Secondary | ICD-10-CM | POA: Insufficient documentation

## 2019-02-16 DIAGNOSIS — Z170 Estrogen receptor positive status [ER+]: Secondary | ICD-10-CM | POA: Diagnosis present

## 2019-02-16 DIAGNOSIS — R293 Abnormal posture: Secondary | ICD-10-CM

## 2019-02-16 DIAGNOSIS — I10 Essential (primary) hypertension: Secondary | ICD-10-CM | POA: Diagnosis not present

## 2019-02-16 DIAGNOSIS — K219 Gastro-esophageal reflux disease without esophagitis: Secondary | ICD-10-CM | POA: Diagnosis not present

## 2019-02-16 DIAGNOSIS — J449 Chronic obstructive pulmonary disease, unspecified: Secondary | ICD-10-CM | POA: Insufficient documentation

## 2019-02-16 DIAGNOSIS — C50912 Malignant neoplasm of unspecified site of left female breast: Secondary | ICD-10-CM

## 2019-02-16 LAB — CBC WITH DIFFERENTIAL (CANCER CENTER ONLY)
Abs Immature Granulocytes: 0.04 10*3/uL (ref 0.00–0.07)
Basophils Absolute: 0 10*3/uL (ref 0.0–0.1)
Basophils Relative: 0 %
Eosinophils Absolute: 0.1 10*3/uL (ref 0.0–0.5)
Eosinophils Relative: 1 %
HCT: 43.2 % (ref 36.0–46.0)
Hemoglobin: 14.3 g/dL (ref 12.0–15.0)
Immature Granulocytes: 0 %
Lymphocytes Relative: 21 %
Lymphs Abs: 2 10*3/uL (ref 0.7–4.0)
MCH: 29.5 pg (ref 26.0–34.0)
MCHC: 33.1 g/dL (ref 30.0–36.0)
MCV: 89.1 fL (ref 80.0–100.0)
Monocytes Absolute: 0.6 10*3/uL (ref 0.1–1.0)
Monocytes Relative: 6 %
Neutro Abs: 6.8 10*3/uL (ref 1.7–7.7)
Neutrophils Relative %: 72 %
Platelet Count: 251 10*3/uL (ref 150–400)
RBC: 4.85 MIL/uL (ref 3.87–5.11)
RDW: 13.2 % (ref 11.5–15.5)
WBC Count: 9.6 10*3/uL (ref 4.0–10.5)
nRBC: 0 % (ref 0.0–0.2)

## 2019-02-16 LAB — CMP (CANCER CENTER ONLY)
ALT: 15 U/L (ref 0–44)
AST: 14 U/L — ABNORMAL LOW (ref 15–41)
Albumin: 3.7 g/dL (ref 3.5–5.0)
Alkaline Phosphatase: 91 U/L (ref 38–126)
Anion gap: 11 (ref 5–15)
BUN: 6 mg/dL (ref 6–20)
CO2: 26 mmol/L (ref 22–32)
Calcium: 9.1 mg/dL (ref 8.9–10.3)
Chloride: 103 mmol/L (ref 98–111)
Creatinine: 0.85 mg/dL (ref 0.44–1.00)
GFR, Est AFR Am: >60 mL/min (ref >60)
GFR, Estimated: >60 mL/min (ref >60)
Glucose, Bld: 94 mg/dL (ref 70–99)
Potassium: 3.5 mmol/L (ref 3.5–5.1)
Sodium: 140 mmol/L (ref 135–145)
Total Bilirubin: 0.7 mg/dL (ref 0.3–1.2)
Total Protein: 6.6 g/dL (ref 6.5–8.1)

## 2019-02-16 NOTE — Assessment & Plan Note (Addendum)
02/11/2019:Routine screening mammogram detected a 59m mass with associated architectural distortion in the upper left breast, no axillary adenopathy. 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  T1 a N0 stage Ia  Pathology and radiology counseling:Discussed with the patient, the details of pathology including the type of breast cancer,the clinical staging, the significance of ER, PR and HER-2/neu receptors and the implications for treatment. After reviewing the pathology in detail, we proceeded to discuss the different treatment options between surgery, radiation, chemotherapy, antiestrogen therapies.  Recommendations: 1. Breast conserving surgery followed by 2. Adjuvant radiation therapy followed by 3. Adjuvant antiestrogen therapy  Return to clinic after surgery to discuss final pathology report and finalize adjuvant treatment plan.

## 2019-02-16 NOTE — Therapy (Signed)
[place], [place], [address] Phone: [phone]   Fax:  [phone]  Physical Therapy Evaluation  Patient Details  Name: Elizabeth Bullock MRN: [id number] Date of Birth: [birth year] Referring Provider (PT): Dr. [name]   Encounter Date: 02/16/2019  PT End of Session - 02/16/19 1311    Visit Number  1    Number of Visits  2    Date for PT Re-Evaluation  04/13/19    PT Start Time  1012    PT Stop Time  1035    PT Time Calculation (min)  23 min    Activity Tolerance  Patient tolerated treatment well    Behavior During Therapy  [facility] for tasks assessed/performed       Past Medical History:  Diagnosis Date  . Anxiety   . Bipolar disorder ([place])   . Bronchitis, chronic ([place])   . COPD (chronic obstructive pulmonary disease) ([place])   . Degenerative disorder of bone    OF BACK  . Depression   . GERD (gastroesophageal reflux disease)   . Headache(784.0)    History of migraines,  takes propanolol for headaches  . High cholesterol   . Hypertension   . Tobacco abuse     Past Surgical History:  Procedure Laterality Date  . ABDOMINAL HYSTERECTOMY    . ANKLE SURGERY Left    left ankle surgery as teenager per pt  . CHOLECYSTECTOMY  03/13/2011   Procedure: LAPAROSCOPIC CHOLECYSTECTOMY;  Surgeon: Donato Heinz;  Location: [facility];  Service: General;  Laterality: N/A;    There were no vitals filed for this visit.   Subjective Assessment - 02/16/19 1244    Subjective  Patient reports she is here today to be seen by her medical team for her newly diagnosed left breast cancer.    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 It measures 4 mm and is located in the upper inner quadrant. It is ER/PR positive and HER2 negative with a Ki67 of 10%. She smokes 1.5 packs per day, has hypertension and bipolar disorder although reports she will not take  medication for her bipolar disorder.    Patient Stated Goals  Reduce lymphedema risk and learn post op shoulder ROM HEP    Currently in Pain?  Yes    Pain Score  8     Pain Location  Back    Pain Orientation  Lower    Pain Descriptors / Indicators  Aching    Pain Type  Chronic pain    Pain Onset  More than a month ago    Pain Frequency  Constant    Aggravating Factors   Nothing    Pain Relieving Factors  Nothing    Multiple Pain Sites  No         [facility] PT Assessment - 02/16/19 0001      Assessment   Medical Diagnosis  Left breast cancer    Referring Provider (PT)  Dr. [name]    Onset Date/Surgical Date  01/12/19    Hand Dominance  Left    Prior Therapy  none      Precautions   Precautions  Other (comment)    Precaution Comments  active cancer      Restrictions   Weight Bearing Restrictions  No      Balance Screen   Has the patient fallen in the past 6 months  No  Has the patient had a decrease in activity level because of a fear of falling?   No    Is the patient reluctant to leave their home because of a fear of falling?   No      Home Film/video editor residence    Living Arrangements  Parent   Lives with and cares for both parents   Available Help at Discharge  Family      Prior Function   Level of [place]  Unemployed    Leisure  She does not exercise      Cognition   Overall Cognitive Status  Within Functional Limits for tasks assessed      Posture/Postural Control   Posture/Postural Control  Postural limitations    Postural Limitations  Rounded Shoulders;Forward head      ROM / Strength   AROM / PROM / Strength  AROM;Strength      AROM   Overall AROM Comments  All cervical AROM is WNL    AROM Assessment Site  Shoulder    Right/Left Shoulder  Right;Left    Right Shoulder Extension  46 Degrees    Right Shoulder Flexion  151 Degrees    Right Shoulder ABduction  162 Degrees    Right  Shoulder Internal Rotation  61 Degrees    Right Shoulder External Rotation  85 Degrees    Left Shoulder Extension  46 Degrees    Left Shoulder Flexion  151 Degrees    Left Shoulder ABduction  162 Degrees    Left Shoulder Internal Rotation  76 Degrees    Left Shoulder External Rotation  89 Degrees      Strength   Overall Strength  Within functional limits for tasks performed        LYMPHEDEMA/ONCOLOGY QUESTIONNAIRE - 02/16/19 1252      Type   Cancer Type  Left breast cancer      Lymphedema Assessments   Lymphedema Assessments  Upper extremities      Right Upper Extremity Lymphedema   10 cm Proximal to Olecranon Process  31.1 cm    Olecranon Process  23.8 cm    10 cm Proximal to Ulnar Styloid Process  22.8 cm    Just Proximal to Ulnar Styloid Process  16.6 cm    Across Hand at [employer]  19 cm    At [place] of 2nd Digit  6.5 cm      Left Upper Extremity Lymphedema   10 cm Proximal to Olecranon Process  31.4 cm    Olecranon Process  24.9 cm    10 cm Proximal to Ulnar Styloid Process  22.7 cm    Just Proximal to Ulnar Styloid Process  16.2 cm    Across Hand at [employer]  19.7 cm    At [place] of 2nd Digit  6.6 cm          Quick Dash - 02/16/19 0001    Open a tight or new jar  No difficulty    Do heavy household chores (wash walls, wash floors)  No difficulty    Carry a shopping bag or briefcase  No difficulty    Wash your back  No difficulty    Use a knife to cut food  No difficulty    Recreational activities in which you take some force or impact through your arm, shoulder, or hand (golf, hammering, tennis)  No difficulty  During the past week, to what extent has your arm, shoulder or hand problem interfered with your normal social activities with family, friends, neighbors, or groups?  Not at all    During the past week, to what extent has your arm, shoulder or hand problem limited your work or other regular daily activities  Not at all    Arm, shoulder, or  hand pain.  None    Tingling (pins and needles) in your arm, shoulder, or hand  None    Difficulty Sleeping  No difficulty    DASH Score  0 %        Objective measurements completed on examination: See above findings.        Patient was instructed today in a home exercise program today for post op shoulder range of motion. These included active assist shoulder flexion in sitting, scapular retraction, wall walking with shoulder abduction, and hands behind head external rotation.  She was encouraged to do these twice a day, holding 3 seconds and repeating 5 times when permitted by her physician.          PT Education - 02/16/19 1254    Education Details  Lymphedema risk reduction and post op shoulder ROM HEP    Person(s) Educated  Patient    Methods  Explanation;Demonstration;Handout    Comprehension  Returned demonstration;Verbalized understanding          PT Long Term Goals - 02/16/19 1314      PT LONG TERM GOAL #1   Title  Patient will demonstrate she has has regained full shoulder ROM and function post operatively compared to baseline assessment.    Time  [facility] Goals - 02/16/19 1314      Patient will be able to verbalize understanding of pertinent lymphedema risk reduction practices relevant to her diagnosis specifically related to skin care.   Time  1    Period  Days    Status  Achieved      Patient will be able to return demonstrate and/or verbalize understanding of the post-op home exercise program related to regaining shoulder range of motion.   Time  1    Period  Days    Status  Achieved      Patient will be able to verbalize understanding of the importance of attending the postoperative After Breast Cancer Class for further lymphedema risk reduction education and therapeutic exercise.   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 It measures 4 mm and is located in the upper inner quadrant. It is ER/PR positive and HER2 negative with a Ki67 of 10%. She smokes 1.5 packs per day, has hypertension and bipolar disorder although reports she will not take medication for her bipolar disorder. Multidisciplinary medical teammet prior to her assessments to determine a recommended treatment plan. She is planning to have a left lumpectomy and sentinel node biopsy followed by radiation and anti-estrogen therapy. She will benefit from a post op PT reassessment to determine needs.    Stability/Clinical Decision Making  Stable/Uncomplicated    Clinical Decision Making  Low    Rehab Potential  Excellent  PT Frequency  --   Eval and 1 f/u visit   PT Treatment/Interventions  ADLs/Self Care Home Management;Therapeutic exercise;Patient/family education    PT Next Visit Plan  Will reassess 3-4 weeks post op to determne needs    PT Home Exercise Plan  Post op shoulder ROM HEP    Consulted and Agree with Plan of Care  Patient       Patient will benefit from skilled therapeutic intervention in order to improve the following deficits and impairments:  Postural dysfunction, Decreased range of motion, Decreased knowledge of precautions, Impaired UE functional use, Pain  Visit Diagnosis: 1. Malignant neoplasm of upper-inner quadrant of left breast in female, estrogen receptor positive ([place])   2. Abnormal posture    Patient will follow up at outpatient cancer rehab 3-4 weeks following surgery.  If the patient requires physical therapy at that time, a specific plan will be dictated and sent to the referring physician for approval. The patient was educated today on appropriate basic range of motion exercises to begin post operatively and the importance of attending the After Breast Cancer class following surgery.  Patient was educated today on lymphedema risk  reduction practices as it pertains to recommendations that will benefit the patient immediately following surgery.  She verbalized good understanding.       Problem List Patient Active Problem List   Diagnosis Date Noted  . Malignant neoplasm of upper-inner quadrant of left breast in female, estrogen receptor positive ([place]) 02/11/2019  . Screening breast examination 02/08/2019  . Nausea with vomiting 02/09/2015  . Pain in the chest   . Gastroenteritis   . Hypertension   . Tobacco abuse   . Chest pain 02/08/2015   Annia Friendly, PT 02/16/19 1:16 PM  [place], [place], [address] Phone: [phone]   Fax:  [phone]  Name: Elizabeth Bullock MRN: [id number] Date of Birth: [birth year]

## 2019-02-16 NOTE — Progress Notes (Signed)
Radiation Oncology         [phone] ________________________________  Multidisciplinary Breast Oncology Clinic [facility]) Initial Outpatient Consultation  Name: Elizabeth Bullock MRN: [id number]  Date: 02/16/2019  DOB: 11-17-1965  CC:Soyla Dryer, Benson Norway, MD   REFERRING PHYSICIAN: [name], MD  DIAGNOSIS: The encounter diagnosis was Malignant neoplasm of upper-inner quadrant of left breast in female, estrogen receptor positive ([place]).  Stage IA (cT1a,cN0) Left Breast UIQ, Invasive Ductal Carcinoma with DCIS, ER+ / PR+ / Her2-, Grade 1    ICD-10-CM   1. Malignant neoplasm of upper-inner quadrant of left breast in female, estrogen receptor positive ([place])  C50.212    Z17.0     HISTORY OF PRESENT ILLNESS::Elizabeth Bullock is a 53 y.o. female who is presenting to the office today for evaluation of her newly diagnosed breast cancer. She is accompanied by no one. She is doing well overall.   She had routine screening mammography on 01/12/2019 showing a possible abnormality in the left breast. She underwent left diagnostic mammography with tomography and left breast ultrasonography at [facility] on 02/01/2019 showing: 4 mm mass with associated architectural distortion at about 11:45, highly suspicious for malignancy.  Biopsy on 02/09/2019 showed: invasive ductal carcinoma, grade 1; DCIS, which is the majority of the biopsy. Prognostic indicators significant for: estrogen receptor, 100% positive and progesterone receptor, 100% positive, both with strong staining intensity. Proliferation marker Ki67 at 10%. HER2 negative.  Menarche: 53 years old Age at first live birth: n/a GP: 0 LMP: 20 years ago (hysterectomy) Contraceptive: no HRT: no   The patient was referred today for presentation in the multidisciplinary conference.  Radiology studies and pathology slides were presented there for review and discussion of treatment options.  A consensus was discussed regarding  potential next steps.  PREVIOUS RADIATION THERAPY: No  PAST MEDICAL HISTORY:  Past Medical History:  Diagnosis Date   Anxiety    Bipolar disorder ([place])    Bronchitis, chronic (HCC)    COPD (chronic obstructive pulmonary disease) ([place])    Degenerative disorder of bone    OF BACK   Depression    GERD (gastroesophageal reflux disease)    Headache(784.0)    History of migraines,  takes propanolol for headaches   High cholesterol    Hypertension    Tobacco abuse     PAST SURGICAL HISTORY: Past Surgical History:  Procedure Laterality Date   ABDOMINAL HYSTERECTOMY     ANKLE SURGERY Left    left ankle surgery as teenager per pt   CHOLECYSTECTOMY  03/13/2011   Procedure: LAPAROSCOPIC CHOLECYSTECTOMY;  Surgeon: Donato Heinz;  Location: [facility];  Service: General;  Laterality: N/A;    FAMILY HISTORY:  Family History  Problem Relation Age of Onset   Hypertension Mother    Diabetes Mother    Asthma Mother    Hypertension Father    COPD Father    Heart attack Father    Hyperlipidemia Brother     SOCIAL HISTORY:  Social History   Socioeconomic History   Marital status: Divorced    Spouse name: Not on file   Number of children: 0   Years of education: Not on file   Highest education level: 9th grade  Occupational History   Not on file  Social Needs   Financial resource strain: Not on file   Food insecurity    Worry: Not on file    Inability: Not on file   Transportation needs    Medical: No  Non-medical: No  Tobacco Use   Smoking status: Current Every Day Smoker    Packs/day: 1.50    Years: 39.00    Pack years: 58.50    Types: Cigarettes   Smokeless tobacco: Never Used  Substance and Sexual Activity   Alcohol use: No   Drug use: Yes    Types: Marijuana    Comment: uses daily   Sexual activity: Yes    Birth control/protection: Surgical  Lifestyle   Physical activity    Days per week: Not on file    Minutes per  session: Not on file   Stress: Not on file  Relationships   Social connections    Talks on phone: Not on file    Gets together: Not on file    Attends religious service: Not on file    Active member of club or organization: Not on file    Attends meetings of clubs or organizations: Not on file    Relationship status: Not on file  Other Topics Concern   Not on file  Social History Narrative   Not on file    ALLERGIES:  Allergies  Allergen Reactions   Codeine Hives and Nausea And Vomiting   Morphine And Related Nausea Only   Penicillins Hives    MEDICATIONS:  Current Outpatient Medications  Medication Sig Dispense Refill   APPLE CIDER VINEGAR PO Take 1 tablet by mouth daily.     atorvastatin (LIPITOR) 20 MG tablet Take 1 tablet (20 mg total) by mouth daily. 90 tablet 1   loratadine (CLARITIN) 10 MG tablet Take 10 mg by mouth daily.     omeprazole (PRILOSEC) 20 MG capsule Take 1 capsule (20 mg total) by mouth daily. 30 capsule 0   vitamin C (ASCORBIC ACID) 500 MG tablet Take 500 mg by mouth daily.     No current facility-administered medications for this encounter.     REVIEW OF SYSTEMS: A 10+ POINT REVIEW OF SYSTEMS WAS OBTAINED including neurology, dermatology, psychiatry, cardiac, respiratory, lymph, extremities, GI, GU, musculoskeletal, constitutional, reproductive, HEENT. On the provided form, she reports no nipple discharge or bleeding. She denies headaches or new bony pain and any other symptoms.    PHYSICAL EXAM:  Vitals with BMI 02/16/2019  Height _0   Weight 165 lbs 11 oz  BMI 29.52  Systolic 841  Diastolic 90  Pulse 64  Respirations   Lungs are clear to auscultation bilaterally. Heart has regular rate and rhythm. No palpable cervical, supraclavicular, or axillary adenopathy. Abdomen soft, non-tender, normal bowel sounds. Right Breast:  breast with no palpable mass, nipple discharge, or bleeding.  Left breast with small biopsy site in the upper  aspect of the breast.  No palpable mass nipple discharge or bleeding.   ECOG = 0  0 - Asymptomatic (Fully active, able to carry on all predisease activities without restriction)  1 - Symptomatic but completely ambulatory (Restricted in physically strenuous activity but ambulatory and able to carry out work of a light or sedentary nature. For example, light housework, office work)  2 - Symptomatic, <50% in bed during the day (Ambulatory and capable of all self care but unable to carry out any work activities. Up and about more than 50% of waking hours)  3 - Symptomatic, >50% in bed, but not bedbound (Capable of only limited self-care, confined to bed or chair 50% or more of waking hours)  4 - Bedbound (Completely disabled. Cannot carry on any self-care. Totally confined to bed or chair)  5 - Death   Eustace Pen MM, Creech RH, Tormey DC, et al. [phone]). "Toxicity and response criteria of the [facility]". [place] Oncol. 5 (6): 649-55  LABORATORY DATA:  Lab Results  Component Value Date   WBC 9.6 02/16/2019   HGB 14.3 02/16/2019   HCT 43.2 02/16/2019   MCV 89.1 02/16/2019   PLT 251 02/16/2019   Lab Results  Component Value Date   NA 140 02/16/2019   K 3.5 02/16/2019   CL 103 02/16/2019   CO2 26 02/16/2019   Lab Results  Component Value Date   ALT 15 02/16/2019   AST 14 (L) 02/16/2019   ALKPHOS 91 02/16/2019   BILITOT 0.7 02/16/2019    PULMONARY FUNCTION TEST:   Recent Review Flowsheet Data    There is no flowsheet data to display.      RADIOGRAPHY: US Breast Ltd Uni Left Inc Axilla  Result Date: 02/01/2019 CLINICAL DATA:  Screening recall for possible architectural distortion in the left breast. EXAM: DIGITAL DIAGNOSTIC LEFT MAMMOGRAM WITH CAD AND TOMO ULTRASOUND LEFT BREAST COMPARISON:  Previous exam(s). ACR Breast Density Category b: There are scattered areas of fibroglandular density. FINDINGS: Possible distortion noted in the upper left breast,  near 12 o'clock, persists diagnostic imaging. This is a true distortion. Is evidence of a small central mass measuring approximately 2-3 mm in size. No other masses, no other areas of architectural distortion and there are no suspicious calcifications. Mammographic images were processed with CAD. On physical exam, no mass is palpated in the upper left breast. Targeted ultrasound is performed, showing a small hypoechoic irregular mass the left breast at approximately 11:45 o'clock, 4 cm the nipple, middle depth, measuring 4 x 3 x 3 mm, with subtle associated sonographic architectural distortion. Masses consistent in size and location to the mammographic distortion. Sonographic evaluation of the left axilla shows no enlarged or abnormal lymph nodes. IMPRESSION: 1. Small mass with associated architectural distortion in the upper left breast, closest to 11:45 o'clock, 4 cm the nipple, measuring 4 mm in greatest dimension sonographically. Findings are highly suspicious for breast malignancy. RECOMMENDATION: 1. Ultrasound-guided core needle biopsy of the small upper left breast mass. I have discussed the findings and recommendations with the patient. Results were also provided in writing at the conclusion of the visit. If applicable, a reminder letter will be sent to the patient regarding the next appointment. BI-RADS CATEGORY  5: Highly suggestive of malignancy. Electronically Signed   By: Lajean Manes M.D.   On: 02/01/2019 13:17   Ms Digital Diag Tomo Uni Left  Result Date: 02/01/2019 CLINICAL DATA:  Screening recall for possible architectural distortion in the left breast. EXAM: DIGITAL DIAGNOSTIC LEFT MAMMOGRAM WITH CAD AND TOMO ULTRASOUND LEFT BREAST COMPARISON:  Previous exam(s). ACR Breast Density Category b: There are scattered areas of fibroglandular density. FINDINGS: Possible distortion noted in the upper left breast, near 12 o'clock, persists diagnostic imaging. This is a true distortion. Is evidence of a  small central mass measuring approximately 2-3 mm in size. No other masses, no other areas of architectural distortion and there are no suspicious calcifications. Mammographic images were processed with CAD. On physical exam, no mass is palpated in the upper left breast. Targeted ultrasound is performed, showing a small hypoechoic irregular mass the left breast at approximately 11:45 o'clock, 4 cm the nipple, middle depth, measuring 4 x 3 x 3 mm, with subtle associated sonographic architectural distortion. Masses consistent in size and location to the  mammographic distortion. Sonographic evaluation of the left axilla shows no enlarged or abnormal lymph nodes. IMPRESSION: 1. Small mass with associated architectural distortion in the upper left breast, closest to 11:45 o'clock, 4 cm the nipple, measuring 4 mm in greatest dimension sonographically. Findings are highly suspicious for breast malignancy. RECOMMENDATION: 1. Ultrasound-guided core needle biopsy of the small upper left breast mass. I have discussed the findings and recommendations with the patient. Results were also provided in writing at the conclusion of the visit. If applicable, a reminder letter will be sent to the patient regarding the next appointment. BI-RADS CATEGORY  5: Highly suggestive of malignancy. Electronically Signed   By: Lajean Manes M.D.   On: 02/01/2019 13:17   Mm Clip Placement Left  Result Date: 02/09/2019 CLINICAL DATA:  Status post stereotactic core needle biopsy of the left breast. EXAM: DIAGNOSTIC LEFT MAMMOGRAM POST STEREOTACTIC BIOPSY COMPARISON:  Previous exam(s). FINDINGS: Mammographic images were obtained following stereotactic guided biopsy of of a left breast architectural distortion. The coil shaped biopsy clip lies within area of the distortion. IMPRESSION: Well-positioned coil shaped biopsy clip following stereotactic core needle biopsy the left breast. Final Assessment: Post Procedure Mammograms for Marker Placement  Electronically Signed   By: Lajean Manes M.D.   On: 02/09/2019 13:48   Mm Lt Breast Bx W Loc Dev 1st Lesion Image Bx Spec Stereo Guide  Addendum Date: 02/10/2019   ADDENDUM REPORT: 02/10/2019 13:56 ADDENDUM: Pathology revealed GRADE I INVASIVE DUCTAL CARCINOMA, DUCTAL CARCINOMA IN SITU of the Left breast, 11:45 o'clock. This was found to be concordant by Dr. Lajean Manes. Pathology results were discussed with the patient by telephone. The patient reported doing well after the biopsy with tenderness at the site. Post biopsy instructions and care were reviewed and questions were answered. The patient was encouraged to call [facility] for any additional concerns. The patient was referred to [facility] on February 16, 2019. Pathology results reported by Terie Purser, RN on 02/10/2019. Electronically Signed   By: Lajean Manes M.D.   On: 02/10/2019 13:56   Result Date: 02/10/2019 CLINICAL DATA:  Patient presents for stereotactic core needle biopsy of an area of architectural distortion in the left breast. She was initially scheduled ultrasound-guided core needle biopsy, but the small mass seen on the diagnostic ultrasound was not confidently visualized for the biopsy procedure. Biopsy was switched to stereotactic guidance. EXAM: LEFT BREAST STEREOTACTIC CORE NEEDLE BIOPSY COMPARISON:  Previous exams. FINDINGS: The patient and I discussed the procedure of stereotactic-guided biopsy including benefits and alternatives. We discussed the high likelihood of a successful procedure. We discussed the risks of the procedure including infection, bleeding, tissue injury, clip migration, and inadequate sampling. Informed written consent was given. The usual time out protocol was performed immediately prior to the procedure. Using sterile technique and 1% Lidocaine as local anesthetic, under stereotactic guidance, a 9 gauge  vacuum assisted device was used to perform core needle biopsy of the architectural distortion in the upper, slightly inner aspect the left breast using a superior approach. Lesion quadrant: Upper inner quadrant: 11:45 o'clock. At the conclusion of the procedure, a coil shaped tissue marker clip was deployed into the biopsy cavity. Follow-up 2-view mammogram was performed and dictated separately. IMPRESSION: Stereotactic-guided biopsy of of the left breast. No apparent complications. Electronically Signed: By: Lajean Manes M.D. On: 02/09/2019 13:44      IMPRESSION: Stage IA (cT1a, cN0) Left  Breast UIQ, Invasive Ductal Carcinoma with DCIS, ER+ / PR+ / Her2-, Grade 1   Patient will be a good candidate for breast conservation with radiotherapy to left breast. We discussed the general course of radiation, potential side effects, and toxicities with radiation and the patient is interested in this approach.    PLAN:  1. Breast conserving surgery 2. Adjuvant radiation therapy 3.   Adjuvant antiestrogen therapy: We will need to check for [facility] and estradiol levels before starting her antiestrogen therapy since she had a prior hysterectomy and does not know if she is truly in menopause.  Patient's right ovary was removed several years ago for  polycystic ovarian issues   ------------------------------------------------  Blair Promise, PhD, MD  This document serves as a record of services personally performed by [name], MD. It was created on his behalf by Wilburn Mylar, a trained medical scribe. The creation of this record is based on the scribe's personal observations and the provider's statements to them. This document has been checked and approved by the attending provider.

## 2019-02-16 NOTE — Patient Instructions (Signed)

## 2019-02-18 ENCOUNTER — Other Ambulatory Visit (HOSPITAL_COMMUNITY): Payer: Self-pay | Admitting: Surgery

## 2019-02-18 DIAGNOSIS — Z170 Estrogen receptor positive status [ER+]: Secondary | ICD-10-CM

## 2019-02-18 DIAGNOSIS — C50912 Malignant neoplasm of unspecified site of left female breast: Secondary | ICD-10-CM

## 2019-02-23 ENCOUNTER — Telehealth: Payer: Self-pay

## 2019-02-23 NOTE — Telephone Encounter (Signed)
Nutrition Assessment  Reason for Assessment:  Pt attended Breast Clinic on 8/19 and received nutrition packet from nurse navigator  ASSESSMENT:  53 year old female with new diagnosis of left breast cancer.  Planning lumpectomy, radiation and antiestrogens.  Past medical history of bipolar, COPD, GERD, HLD, HTN.  Spoke with patient via phone this am to introduce self and service at [facility].    Medications:  Prilosec, vit c  Labs: reviewed  Anthropometrics:   Height: 63 inches Weight: 165 lb 11.2 oz BMI: 29   NUTRITION DIAGNOSIS: Food and nutrition related knowledge deficit related to new diagnosis of breast cancer as evidenced by no prior need for nutrition related information.  INTERVENTION:   Discussed briefly packet of information regarding nutritional tips for breast cancer patients.  No questions at this time.  Contact information provided and patient knows to contact me with questions/concerns.    MONITORING, EVALUATION, and GOAL: Pt will consume a healthy plant based diet to maintain lean body mass throughout treatment.   Lane Kjos B. Zenia Resides, [place], [place] Registered Dietitian [phone] (pager)

## 2019-02-24 ENCOUNTER — Telehealth: Payer: Self-pay | Admitting: *Deleted

## 2019-02-24 DIAGNOSIS — C50212 Malignant neoplasm of upper-inner quadrant of left female breast: Secondary | ICD-10-CM

## 2019-02-24 NOTE — Telephone Encounter (Signed)
Spoke with patient to follow up from [facility].  She states she no questions at this time other than when they will place the seed.  Informed her that [facility] will call her with all of that information and instructions.

## 2019-02-25 ENCOUNTER — Telehealth: Payer: Self-pay | Admitting: Hematology and Oncology

## 2019-02-25 NOTE — Telephone Encounter (Signed)
Scheduled appt per 8/27 sch  Message. Unable to reach pt . Left message with appt date and time - reminder letter mailed .

## 2019-03-03 ENCOUNTER — Encounter: Payer: Self-pay | Admitting: *Deleted

## 2019-03-08 NOTE — Progress Notes (Signed)
[place], [place] - [id number] [place] [address] Phone: [phone] Fax: [phone]  [facility], [place], [place] [address] [place] [address] Phone: [phone] Fax: [phone]      Your procedure is scheduled on March 15, 2019.  Report to [facility] Main Entrance "A" at 05:30 A.M., and check in at the Admitting office.  Call this number if you have problems the morning of surgery:  [phone]  Call [phone] if you have any questions prior to your surgery date Monday-Friday 8am-4pm    Remember:  Do not eat after midnight the night before your surgery  You may drink clear liquids until 04:30 am the morning of your surgery.   Clear liquids allowed are: Water, Non-Citrus Juices (without pulp), Carbonated Beverages, Clear Tea, Black Coffee Only, and Gatorade    Take these medicines the morning of surgery with A SIP OF WATER : Acetaminophen (Tylenol) if needed Atorvastatin (Lipitor) Loratadine (Claritin) if needed Omeprazole (Prilosec)  7 days prior to surgery STOP taking any Aspirin (unless otherwise instructed by your surgeon), Aleve, Naproxen, Ibuprofen, Motrin, Advil, Goody's, BC's, all herbal medications, fish oil, and all vitamins.    The Morning of Surgery  Do not wear jewelry, make-up or nail polish.  Do not wear lotions, powders, or perfumes/colognes, or deodorant  Do not shave 48 hours prior to surgery.    Do not bring valuables to the hospital.  [facility] is not responsible for any belongings or valuables.  If you are a smoker, DO NOT Smoke 24 hours prior to surgery IF you wear a CPAP at night please bring your mask, tubing, and machine the morning of surgery   Remember that you must have someone to transport you home after your surgery, and remain with you for 24 hours if you are discharged the same day.   Contacts,  glasses, hearing aids, dentures or bridgework may not be worn into surgery.    Leave your suitcase in the car.  After surgery it may be brought to your room.  For patients admitted to the hospital, discharge time will be determined by your treatment team.  Patients discharged the day of surgery will not be allowed to drive home.    Special instructions:   [place]- Preparing For Surgery  Before surgery, you can play an important role. Because skin is not sterile, your skin needs to be as free of germs as possible. You can reduce the number of germs on your skin by washing with CHG (chlorahexidine gluconate) Soap before surgery.  CHG is an antiseptic cleaner which kills germs and bonds with the skin to continue killing germs even after washing.    Oral Hygiene is also important to reduce your risk of infection.  Remember - BRUSH YOUR TEETH THE MORNING OF SURGERY WITH YOUR REGULAR TOOTHPASTE  Please do not use if you have an allergy to CHG or antibacterial soaps. If your skin becomes reddened/irritated stop using the CHG.  Do not shave (including legs and underarms) for at least 48 hours prior to first CHG shower. It is OK to shave your face.  Please follow these instructions carefully.   1. Shower the NIGHT BEFORE SURGERY and the MORNING OF SURGERY with CHG Soap.   2. If you chose to wash your hair, wash your hair first as usual with your normal shampoo.  3. After you  shampoo, rinse your hair and body thoroughly to remove the shampoo.  4. Use CHG as you would any other liquid soap. You can apply CHG directly to the skin and wash gently with a scrungie or a clean washcloth.   5. Apply the CHG Soap to your body ONLY FROM THE NECK DOWN.  Do not use on open wounds or open sores. Avoid contact with your eyes, ears, mouth and genitals (private parts). Wash Face and genitals (private parts)  with your normal soap.   6. Wash thoroughly, paying special attention to the area where your  surgery will be performed.  7. Thoroughly rinse your body with warm water from the neck down.  8. DO NOT shower/wash with your normal soap after using and rinsing off the CHG Soap.  9. Pat yourself dry with a CLEAN TOWEL.  10. Wear CLEAN PAJAMAS to bed the night before surgery, wear comfortable clothes the morning of surgery  11. Place CLEAN SHEETS on your bed the night of your first shower and DO NOT SLEEP WITH PETS.    Day of Surgery:  Do not apply any deodorants/lotions. Please shower the morning of surgery with the CHG soap  Please wear clean clothes to the hospital/surgery center.   Remember to brush your teeth WITH YOUR REGULAR TOOTHPASTE.   Please read over the following fact sheets that you were given.

## 2019-03-09 ENCOUNTER — Encounter (HOSPITAL_COMMUNITY)
Admission: RE | Admit: 2019-03-09 | Discharge: 2019-03-09 | Disposition: A | Discharge status: Home / Self Care | Payer: Medicaid Other | Source: Ambulatory Visit | Attending: Surgery | Admitting: Surgery

## 2019-03-09 ENCOUNTER — Encounter (HOSPITAL_COMMUNITY): Payer: Self-pay

## 2019-03-09 ENCOUNTER — Other Ambulatory Visit: Payer: Self-pay

## 2019-03-09 DIAGNOSIS — Z01818 Encounter for other preprocedural examination: Secondary | ICD-10-CM | POA: Diagnosis present

## 2019-03-09 LAB — COMPREHENSIVE METABOLIC PANEL
ALT: 19 U/L (ref 0–44)
AST: 20 U/L (ref 15–41)
Albumin: 4.2 g/dL (ref 3.5–5.0)
Alkaline Phosphatase: 96 U/L (ref 38–126)
Anion gap: 10 (ref 5–15)
BUN: 6 mg/dL (ref 6–20)
CO2: 25 mmol/L (ref 22–32)
Calcium: 9.4 mg/dL (ref 8.9–10.3)
Chloride: 104 mmol/L (ref 98–111)
Creatinine, Ser: 0.86 mg/dL (ref 0.44–1.00)
GFR calc Af Amer: >60 mL/min (ref >60)
GFR calc non Af Amer: >60 mL/min (ref >60)
Glucose, Bld: 81 mg/dL (ref 70–99)
Potassium: 3.8 mmol/L (ref 3.5–5.1)
Sodium: 139 mmol/L (ref 135–145)
Total Bilirubin: 0.7 mg/dL (ref 0.3–1.2)
Total Protein: 7 g/dL (ref 6.5–8.1)

## 2019-03-09 LAB — CBC
HCT: 47.2 % — ABNORMAL HIGH (ref 36.0–46.0)
Hemoglobin: 15.7 g/dL — ABNORMAL HIGH (ref 12.0–15.0)
MCH: 29.7 pg (ref 26.0–34.0)
MCHC: 33.3 g/dL (ref 30.0–36.0)
MCV: 89.4 fL (ref 80.0–100.0)
Platelets: 260 10*3/uL (ref 150–400)
RBC: 5.28 MIL/uL — ABNORMAL HIGH (ref 3.87–5.11)
RDW: 12.6 % (ref 11.5–15.5)
WBC: 9.7 10*3/uL (ref 4.0–10.5)
nRBC: 0 % (ref 0.0–0.2)

## 2019-03-09 NOTE — Progress Notes (Signed)
  Coronavirus Screening To be tested on Fri Have you experienced the following symptoms:  Cough yes/no: No Fever (>100.12F)  yes/no: No Runny nose yes/no: No Sore throat yes/no: No Difficulty breathing/shortness of breath  yes/no: No Loss of smell or taste-No Have you or a family member traveled in the last 14 days and where? yes/no: No  PCP - Museum/gallery exhibitions officer - denies  Oncology-Dr [employer]. Onc-Dr [name]  Chest x-ray - NA  EKG - today  Stress Test - denies  ECHO - denies  Cardiac Cath - denies  AICD-denies PM-denies LOOP-denies  Sleep Study - No CPAP - NA  LABS-CBC,CMP  ASA-denies  ERAS-denies  HA1C-denies Fasting Blood Sugar -  Checks Blood Sugar _0____ times a day  Anesthesia-N. Seed placement on 03/14/19  Pt denies having chest pain, sob, or fever at this time. All instructions explained to the pt, with a verbal understanding of the material. Pt agrees to go over the instructions while at home for a better understanding. Pt also instructed to self quarantine after being tested for COVID-19. The opportunity to ask questions was provided.

## 2019-03-11 ENCOUNTER — Other Ambulatory Visit (HOSPITAL_COMMUNITY)
Admission: RE | Admit: 2019-03-11 | Discharge: 2019-03-11 | Disposition: A | Discharge status: Home / Self Care | Payer: Medicaid Other | Source: Ambulatory Visit | Attending: Surgery | Admitting: Surgery

## 2019-03-11 DIAGNOSIS — Z20828 Contact with and (suspected) exposure to other viral communicable diseases: Secondary | ICD-10-CM | POA: Insufficient documentation

## 2019-03-11 DIAGNOSIS — Z01812 Encounter for preprocedural laboratory examination: Secondary | ICD-10-CM | POA: Insufficient documentation

## 2019-03-11 LAB — SARS CORONAVIRUS 2 (TAT 6-24 HRS): SARS Coronavirus 2: NEGATIVE

## 2019-03-14 ENCOUNTER — Other Ambulatory Visit: Payer: Self-pay

## 2019-03-14 ENCOUNTER — Ambulatory Visit
Admission: RE | Admit: 2019-03-14 | Discharge: 2019-03-14 | Disposition: A | Discharge status: Home / Self Care | Payer: Medicaid Other | Source: Ambulatory Visit | Attending: Surgery | Admitting: Surgery

## 2019-03-14 DIAGNOSIS — C50912 Malignant neoplasm of unspecified site of left female breast: Secondary | ICD-10-CM

## 2019-03-14 DIAGNOSIS — Z170 Estrogen receptor positive status [ER+]: Secondary | ICD-10-CM

## 2019-03-14 NOTE — H&P (Signed)
Elizabeth Bullock  Location: [facility] Surgery Patient #: [phone] DOB: 29-May-1966 Undefined / Language: Undefined / Race: White Female  History of Present Illness   The patient is a 53 year old female who presents with a complaint of Breast cancer.  The PCP is Soyla Dryer, [place] (at [facility])  The pateint was seen at the [facility] Oncology is Drs. Gudena and Kinard. She is by herself. She did not want to call anyone.  [The Covid-19 virus has disrupted normal medical care in [place] and across the nation. We have sometimes had to alter normal surgical/medical care to limit this epidemic and we have explained these changes to the patient.]  She has not had a mammogram in 5 years. She did not feel anything in particular about her breast. She has no prior history of breast disease. She is on no hormone.  Mammograms: She had a mammogram on 02/01/2019 at [facility]. It showed a 0.4 x 0.3 cm mass in the left breast at 11:45 o'clock. Her breast density is "b". 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 Family history of breast or ovarian cancer: her great grandmother had breast cancer On hormone therapy: no  I discussed the options for breast cancer treatment with the patient. The patient is at the [facility], which includes medical oncology and radiation oncology. I discussed the surgical options of lumpectomy vs. mastectomy. If mastectomy, there is the possibility of reconstruction. I discussed the options of lymph node biopsy. The treatment plan depends on the pathologic staging of the tumor and the patient's personal wishes. The risks of surgery include, but are not limited to, bleeding, infection, the need for further surgery, and nerve injury. The patient has been given literature on  the treatment of breast cancer.  Plan : 1. Left breast lumpectomy (seed localization) and left axillary SLNBx, 2. Radiation tx, 3. Antihormone tx.  Past Medical History: 1. Smokes - 1 1/2 ppd 2. 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 Bipolar Does not see anyone at this time. Does not take meds 4. Hypercholesterolemia 5. Hysterectomy, right oophorectomy - 1999 For polycystic ovaries 6. Lap chole - 2013 - [place] 7. Colonoscopy - none But she had a screening test last month which was neg  Social History: Divorced. No children.  Lives with her parents - whom she cares for. She did not want me to call her parents. She does not have a formal job, other than her parental care.   Past Surgical History Tawni Pummel, RN; 02/16/2019 7:25 AM) Gallbladder Surgery - Laparoscopic  Hysterectomy (not due to cancer) - Partial   Diagnostic Studies History Tawni Pummel, RN; 02/16/2019 7:25 AM) Mammogram  within last year Pap Smear  >5 years ago  Medication History Tawni Pummel, RN; 02/16/2019 7:25 AM) Medications Reconciled  Social History Tawni Pummel, RN; 02/16/2019 7:25 AM) Caffeine use  Coffee, Tea. No alcohol use  No drug use  Tobacco use  Current some day smoker.  Family History Tawni Pummel, RN; 02/16/2019 7:25 AM) Alcohol Abuse  Father. Anesthetic complications  Mother. Arthritis  Mother. Cerebrovascular Accident  Father. Depression  Mother. Diabetes Mellitus  Mother. Heart Disease  Father. Hypertension  Father, Mother.  Pregnancy / Birth History Tawni Pummel, RN; 02/16/2019 7:25 AM) Age at menarche  26 years. Age of menopause  <47 Gravida  0  Irregular periods  Para  0  Other Problems Tawni Pummel, RN; 02/16/2019 7:25 AM) Chronic Obstructive Lung Disease  Depression  Gastroesophageal Reflux Disease  Hypercholesterolemia  Migraine Headache     Review of Systems Sunday Spillers Ledford RN; 02/16/2019  7:25 AM) General Not Present- Appetite Loss, Chills, Fatigue, Fever, Night Sweats, Weight Gain and Weight Loss. Skin Not Present- Change in Wart/Mole, Dryness, Hives, Jaundice, New Lesions, Non-Healing Wounds, Rash and Ulcer. HEENT Present- Wears glasses/contact lenses. Not Present- Earache, Hearing Loss, Hoarseness, Nose Bleed, Oral Ulcers, Ringing in the Ears, Seasonal Allergies, Sinus Pain, Sore Throat, Visual Disturbances and Yellow Eyes. Respiratory Not Present- Bloody sputum, Chronic Cough, Difficulty Breathing, Snoring and Wheezing. Breast Present- Breast Pain. Not Present- Breast Mass, Nipple Discharge and Skin Changes. Cardiovascular Present- Leg Cramps and Shortness of Breath. Not Present- Chest Pain, Difficulty Breathing Lying Down, Palpitations, Rapid Heart Rate and Swelling of Extremities. Gastrointestinal Present- Abdominal Pain and Chronic diarrhea. Not Present- Bloating, Bloody Stool, Change in Bowel Habits, Constipation, Difficulty Swallowing, Excessive gas, Gets full quickly at meals, Hemorrhoids, Indigestion, Nausea, Rectal Pain and Vomiting. Female Genitourinary Not Present- Frequency, Nocturia, Painful Urination, Pelvic Pain and Urgency. Musculoskeletal Present- Joint Pain. Not Present- Back Pain, Joint Stiffness, Muscle Pain, Muscle Weakness and Swelling of Extremities. Neurological Present- Headaches. Not Present- Decreased Memory, Fainting, Numbness, Seizures, Tingling, Tremor, Trouble walking and Weakness. Psychiatric Present- Bipolar. Not Present- Anxiety, Change in Sleep Pattern, Depression, Fearful and Frequent crying. Endocrine Present- Hot flashes. Not Present- Cold Intolerance, Excessive Hunger, Hair Changes, Heat Intolerance and New Diabetes.   Physical Exam  Note: General: WN WF who is alert and generally healthy appearing. She is wearing a mask. Skin: Inspection and palpation of the skin unremarkable.  Eyes: Conjunctivae white, pupils equal. Face, ears, nose,  mouth, and throat: Face - wearing mask  Neck: Supple. No mass. Trachea midline. No thyroid mass. Lymph Nodes: No supraclavicular or cervical adenopathy.  No axillary adenopathy.  Lungs: Normal respiratory effort. Clear to auscultation and symmetric breath sounds. Cardiovascular: Regular rate and rythm. Normal auscultation of the heart. No murmur or rub.  Breast: Right - No mass or nodule. She has a tweety bird in her upper inner right breast.  Left - She has a scar at 11:30 from the core biopsy needle  Abdomen: Soft. No mass. Liver and spleen not palpable. No tenderness. No hernia. Normal bowel sounds.  Pfannenstiel scar and lap chole scars. Rectal: Not done.  Musculoskeletal/extremities: Normal gait. Good strength and ROM in upper and lower extremities.  Neurologic: Grossly intact to motor and sensory function. Psychiatric: Has normal mood and affect. Judgement and insight appear normal.  Assessment & Plan  1.  MALIGNANT NEOPLASM OF LEFT BREAST, STAGE 1, ESTROGEN RECEPTOR POSITIVE (C50.912) Story: Left breast biopsy at 11:45 o'clock on 02/09/2019 ([id number]) - IDC, grade 1, ER - 100%, PR - 100%, Ki67 - 10%, and Her2Neu - neg.  Oncology - [facility] and [place] :   1. Left breast lumpectomy (seed localization) and left axillary SLNBx,  2. Radiation tx,  3. Antihormone tx.  2.  SMOKES (F17.200)  3. 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 Bipolar Does not see anyone at this time. Does not take meds 5. Hypercholesterolemia   [name], MD, [facility] Surgery Pager: [phone] Office phone:  [phone]

## 2019-03-15 ENCOUNTER — Ambulatory Visit (HOSPITAL_COMMUNITY): Payer: Medicaid Other | Admitting: Certified Registered Nurse Anesthetist

## 2019-03-15 ENCOUNTER — Ambulatory Visit
Admission: RE | Admit: 2019-03-15 | Discharge: 2019-03-15 | Disposition: A | Discharge status: Home / Self Care | Payer: No Typology Code available for payment source | Source: Ambulatory Visit | Attending: Surgery | Admitting: Surgery

## 2019-03-15 ENCOUNTER — Ambulatory Visit (HOSPITAL_COMMUNITY)
Admission: RE | Admit: 2019-03-15 | Discharge: 2019-03-15 | Disposition: A | Discharge status: Home / Self Care | Payer: Medicaid Other | Attending: Surgery | Admitting: Surgery

## 2019-03-15 ENCOUNTER — Encounter (HOSPITAL_COMMUNITY)
Admission: RE | Admit: 2019-03-15 | Discharge: 2019-03-15 | Disposition: A | Discharge status: Home / Self Care | Payer: Medicaid Other | Source: Ambulatory Visit | Attending: Surgery | Admitting: Surgery

## 2019-03-15 ENCOUNTER — Other Ambulatory Visit: Payer: Self-pay

## 2019-03-15 ENCOUNTER — Encounter (HOSPITAL_COMMUNITY): Payer: Self-pay

## 2019-03-15 ENCOUNTER — Encounter (HOSPITAL_COMMUNITY)
Admission: RE | Disposition: A | Discharge status: Home / Self Care | Payer: Self-pay | Source: Home / Self Care | Attending: Surgery

## 2019-03-15 DIAGNOSIS — Z170 Estrogen receptor positive status [ER+]: Secondary | ICD-10-CM | POA: Insufficient documentation

## 2019-03-15 DIAGNOSIS — I10 Essential (primary) hypertension: Secondary | ICD-10-CM | POA: Insufficient documentation

## 2019-03-15 DIAGNOSIS — Z8041 Family history of malignant neoplasm of ovary: Secondary | ICD-10-CM | POA: Diagnosis not present

## 2019-03-15 DIAGNOSIS — J449 Chronic obstructive pulmonary disease, unspecified: Secondary | ICD-10-CM | POA: Diagnosis not present

## 2019-03-15 DIAGNOSIS — C50812 Malignant neoplasm of overlapping sites of left female breast: Secondary | ICD-10-CM | POA: Insufficient documentation

## 2019-03-15 DIAGNOSIS — F17210 Nicotine dependence, cigarettes, uncomplicated: Secondary | ICD-10-CM | POA: Insufficient documentation

## 2019-03-15 DIAGNOSIS — C50912 Malignant neoplasm of unspecified site of left female breast: Secondary | ICD-10-CM

## 2019-03-15 DIAGNOSIS — Z803 Family history of malignant neoplasm of breast: Secondary | ICD-10-CM | POA: Diagnosis not present

## 2019-03-15 DIAGNOSIS — C50212 Malignant neoplasm of upper-inner quadrant of left female breast: Secondary | ICD-10-CM | POA: Diagnosis present

## 2019-03-15 HISTORY — PX: BREAST LUMPECTOMY WITH RADIOACTIVE SEED AND SENTINEL LYMPH NODE BIOPSY: SHX6550

## 2019-03-15 HISTORY — PX: BREAST LUMPECTOMY: SHX2

## 2019-03-15 SURGERY — BREAST LUMPECTOMY WITH RADIOACTIVE SEED AND SENTINEL LYMPH NODE BIOPSY
Anesthesia: General | Site: Breast | Laterality: Left

## 2019-03-15 MED ORDER — STERILE WATER FOR IRRIGATION IR SOLN
Status: DC | PRN
  Administered 2019-03-15: 1000 mL

## 2019-03-15 MED ORDER — ONDANSETRON HCL 4 MG/2ML IJ SOLN
INTRAMUSCULAR | Status: DC | PRN
  Administered 2019-03-15: 4 mg via INTRAVENOUS

## 2019-03-15 MED ORDER — BUPIVACAINE-EPINEPHRINE 0.25% -1:200000 IJ SOLN
INTRAMUSCULAR | Status: DC | PRN
  Administered 2019-03-15: 27 mL

## 2019-03-15 MED ORDER — CHLORHEXIDINE GLUCONATE CLOTH 2 % EX PADS: 6.0000 | MEDICATED_PAD | Freq: Once | CUTANEOUS | Status: DC

## 2019-03-15 MED ORDER — PROPOFOL 10 MG/ML IV BOLUS
INTRAVENOUS | Status: DC | PRN
  Administered 2019-03-15: 150 mg via INTRAVENOUS

## 2019-03-15 MED ORDER — SCOPOLAMINE 1 MG/3DAYS TD PT72
MEDICATED_PATCH | TRANSDERMAL | Status: AC
  Filled 2019-03-15: qty 1

## 2019-03-15 MED ORDER — MIDAZOLAM HCL 5 MG/5ML IJ SOLN
INTRAMUSCULAR | Status: DC | PRN
  Administered 2019-03-15: 2 mg via INTRAVENOUS

## 2019-03-15 MED ORDER — ROPIVACAINE HCL 5 MG/ML IJ SOLN
INTRAMUSCULAR | Status: DC | PRN
  Administered 2019-03-15: 30 mL

## 2019-03-15 MED ORDER — SCOPOLAMINE 1 MG/3DAYS TD PT72
MEDICATED_PATCH | TRANSDERMAL | Status: DC | PRN
  Administered 2019-03-15: 1 via TRANSDERMAL

## 2019-03-15 MED ORDER — DEXAMETHASONE SODIUM PHOSPHATE 10 MG/ML IJ SOLN
INTRAMUSCULAR | Status: DC | PRN
  Administered 2019-03-15: 5 mg via INTRAVENOUS

## 2019-03-15 MED ORDER — FENTANYL CITRATE (PF) 100 MCG/2ML IJ SOLN
INTRAMUSCULAR | Status: DC | PRN
  Administered 2019-03-15: 50 ug via INTRAVENOUS
  Administered 2019-03-15: 25 ug via INTRAVENOUS
  Administered 2019-03-15 (×2): 50 ug via INTRAVENOUS
  Administered 2019-03-15: 25 ug via INTRAVENOUS
  Administered 2019-03-15: 50 ug via INTRAVENOUS

## 2019-03-15 MED ORDER — ACETAMINOPHEN 500 MG PO TABS
ORAL_TABLET | ORAL | Status: AC
  Filled 2019-03-15: qty 2

## 2019-03-15 MED ORDER — EPHEDRINE SULFATE-NACL 50-0.9 MG/10ML-% IV SOSY
PREFILLED_SYRINGE | INTRAVENOUS | Status: DC | PRN
  Administered 2019-03-15 (×2): 5 mg via INTRAVENOUS

## 2019-03-15 MED ORDER — LIDOCAINE 2% (20 MG/ML) 5 ML SYRINGE
INTRAMUSCULAR | Status: DC | PRN
  Administered 2019-03-15: 60 mg via INTRAVENOUS

## 2019-03-15 MED ORDER — SODIUM CHLORIDE (PF) 0.9 % IJ SOLN
INTRAVENOUS | Status: DC | PRN
  Administered 2019-03-15: 08:00:00 1 mL

## 2019-03-15 MED ORDER — CELECOXIB 200 MG PO CAPS
200.0000 mg | ORAL_CAPSULE | ORAL | Status: AC
  Administered 2019-03-15: 200 mg via ORAL
  Filled 2019-03-15: qty 1

## 2019-03-15 MED ORDER — EPHEDRINE 5 MG/ML INJ
INTRAVENOUS | Status: AC
  Filled 2019-03-15: qty 10

## 2019-03-15 MED ORDER — BUPIVACAINE-EPINEPHRINE (PF) 0.25% -1:200000 IJ SOLN
INTRAMUSCULAR | Status: AC
  Filled 2019-03-15: qty 30

## 2019-03-15 MED ORDER — 0.9 % SODIUM CHLORIDE (POUR BTL) OPTIME
TOPICAL | Status: DC | PRN
  Administered 2019-03-15: 08:00:00 1000 mL

## 2019-03-15 MED ORDER — ONDANSETRON HCL 4 MG/2ML IJ SOLN
INTRAMUSCULAR | Status: AC
  Filled 2019-03-15: qty 2

## 2019-03-15 MED ORDER — METHYLENE BLUE 0.5 % INJ SOLN
INTRAVENOUS | Status: AC
  Filled 2019-03-15: qty 10

## 2019-03-15 MED ORDER — DEXAMETHASONE SODIUM PHOSPHATE 10 MG/ML IJ SOLN
INTRAMUSCULAR | Status: AC
  Filled 2019-03-15: qty 1

## 2019-03-15 MED ORDER — ACETAMINOPHEN 500 MG PO TABS
1000.0000 mg | ORAL_TABLET | ORAL | Status: AC
  Administered 2019-03-15: 1000 mg via ORAL
  Filled 2019-03-15: qty 2

## 2019-03-15 MED ORDER — MIDAZOLAM HCL 2 MG/2ML IJ SOLN
INTRAMUSCULAR | Status: AC
  Filled 2019-03-15: qty 2

## 2019-03-15 MED ORDER — LACTATED RINGERS IV SOLN
INTRAVENOUS | Status: DC | PRN
  Administered 2019-03-15: 07:00:00 via INTRAVENOUS

## 2019-03-15 MED ORDER — HYDROCODONE-ACETAMINOPHEN 5-325 MG PO TABS: 1.0000 | ORAL_TABLET | Freq: Four times a day (QID) | ORAL | 0 refills | Status: DC | PRN

## 2019-03-15 MED ORDER — TECHNETIUM TC 99M SULFUR COLLOID FILTERED
1.0000 | Freq: Once | INTRAVENOUS | Status: AC | PRN
  Administered 2019-03-15: 1 via INTRADERMAL

## 2019-03-15 MED ORDER — FENTANYL CITRATE (PF) 250 MCG/5ML IJ SOLN
INTRAMUSCULAR | Status: AC
  Filled 2019-03-15: qty 5

## 2019-03-15 MED ORDER — LIDOCAINE 2% (20 MG/ML) 5 ML SYRINGE
INTRAMUSCULAR | Status: AC
  Filled 2019-03-15: qty 5

## 2019-03-15 MED ORDER — PROPOFOL 10 MG/ML IV BOLUS
INTRAVENOUS | Status: AC
  Filled 2019-03-15: qty 40

## 2019-03-15 MED ORDER — CEFAZOLIN SODIUM-DEXTROSE 2-4 GM/100ML-% IV SOLN
2.0000 g | INTRAVENOUS | Status: AC
  Administered 2019-03-15: 2 g via INTRAVENOUS
  Filled 2019-03-15: qty 100

## 2019-03-15 SURGICAL SUPPLY — 39 items
BINDER BREAST LRG (GAUZE/BANDAGES/DRESSINGS) ×1 IMPLANT
BINDER BREAST XLRG (GAUZE/BANDAGES/DRESSINGS) IMPLANT
CANISTER SUCT 3000ML PPV (MISCELLANEOUS) IMPLANT
CHLORAPREP W/TINT 26 (MISCELLANEOUS) ×2 IMPLANT
CLIP VESOCCLUDE SM WIDE 6/CT (CLIP) ×2 IMPLANT
COVER PROBE W GEL 5X96 (DRAPES) ×2 IMPLANT
COVER SURGICAL LIGHT HANDLE (MISCELLANEOUS) ×2 IMPLANT
COVER WAND RF STERILE (DRAPES) ×2 IMPLANT
DECANTER SPIKE VIAL GLASS SM (MISCELLANEOUS) ×2 IMPLANT
DERMABOND ADVANCED (GAUZE/BANDAGES/DRESSINGS) ×1
DERMABOND ADVANCED .7 DNX12 (GAUZE/BANDAGES/DRESSINGS) ×1 IMPLANT
DEVICE DUBIN SPECIMEN MAMMOGRA (MISCELLANEOUS) ×2 IMPLANT
DRAPE CHEST BREAST 15X10 FENES (DRAPES) ×2 IMPLANT
ELECT COATED BLADE 2.86 ST (ELECTRODE) ×2 IMPLANT
ELECT REM PT RETURN 9FT ADLT (ELECTROSURGICAL) ×2
ELECTRODE REM PT RTRN 9FT ADLT (ELECTROSURGICAL) ×1 IMPLANT
GAUZE SPONGE 4X4 12PLY STRL (GAUZE/BANDAGES/DRESSINGS) ×2 IMPLANT
GOWN STRL REUS W/ TWL LRG LVL3 (GOWN DISPOSABLE) ×1 IMPLANT
GOWN STRL REUS W/ TWL XL LVL3 (GOWN DISPOSABLE) ×1 IMPLANT
GOWN STRL REUS W/TWL LRG LVL3 (GOWN DISPOSABLE) ×1
GOWN STRL REUS W/TWL XL LVL3 (GOWN DISPOSABLE) ×1
ILLUMINATOR WAVEGUIDE N/F (MISCELLANEOUS) IMPLANT
KIT BASIN OR (CUSTOM PROCEDURE TRAY) ×2 IMPLANT
KIT MARKER MARGIN INK (KITS) ×2 IMPLANT
LIGHT WAVEGUIDE WIDE FLAT (MISCELLANEOUS) IMPLANT
NDL 18GX1X1/2 (RX/OR ONLY) (NEEDLE) IMPLANT
NDL FILTER BLUNT 18X1 1/2 (NEEDLE) IMPLANT
NDL HYPO 25GX1X1/2 BEV (NEEDLE) ×1 IMPLANT
NEEDLE 18GX1X1/2 (RX/OR ONLY) (NEEDLE) IMPLANT
NEEDLE FILTER BLUNT 18X 1/2SAF (NEEDLE)
NEEDLE FILTER BLUNT 18X1 1/2 (NEEDLE) IMPLANT
NEEDLE HYPO 25GX1X1/2 BEV (NEEDLE) ×2 IMPLANT
NS IRRIG 1000ML POUR BTL (IV SOLUTION) ×2 IMPLANT
PACK GENERAL/GYN (CUSTOM PROCEDURE TRAY) ×2 IMPLANT
SUT MNCRL AB 4-0 PS2 18 (SUTURE) ×2 IMPLANT
SUT VIC AB 3-0 SH 8-18 (SUTURE) ×2 IMPLANT
SYR CONTROL 10ML LL (SYRINGE) ×2 IMPLANT
TOWEL GREEN STERILE (TOWEL DISPOSABLE) ×2 IMPLANT
TOWEL GREEN STERILE FF (TOWEL DISPOSABLE) ×2 IMPLANT

## 2019-03-15 NOTE — Anesthesia Preprocedure Evaluation (Signed)
Anesthesia Evaluation  Patient identified by MRN, date of birth, ID band Patient awake    Reviewed: Allergy & Precautions, NPO status , Patient's Chart, lab work & pertinent test results  Airway Mallampati: II  TM Distance: >3 FB Neck ROM: Full    Dental no notable dental hx.    Pulmonary COPD, Current Smoker and Patient abstained from smoking.,    Pulmonary exam normal breath sounds clear to auscultation       Cardiovascular hypertension, Normal cardiovascular exam Rhythm:Regular Rate:Normal     Neuro/Psych Bipolar Disorder negative neurological ROS     GI/Hepatic Neg liver ROS, GERD  ,  Endo/Other  negative endocrine ROS  Renal/GU negative Renal ROS  negative genitourinary   Musculoskeletal negative musculoskeletal ROS (+)   Abdominal   Peds negative pediatric ROS (+)  Hematology negative hematology ROS (+)   Anesthesia Other Findings   Reproductive/Obstetrics negative OB ROS                             Anesthesia Physical Anesthesia Plan  ASA: III  Anesthesia Plan: General   Post-op Pain Management:  Regional for Post-op pain   Induction: Intravenous  PONV Risk Score and Plan: 2 and Ondansetron, Dexamethasone and Treatment may vary due to age or medical condition  Airway Management Planned: LMA  Additional Equipment:   Intra-op Plan:   Post-operative Plan: Extubation in OR  Informed Consent: I have reviewed the patients History and Physical, chart, labs and discussed the procedure including the risks, benefits and alternatives for the proposed anesthesia with the patient or authorized representative who has indicated his/her understanding and acceptance.     Dental advisory given  Plan Discussed with: CRNA and Surgeon  Anesthesia Plan Comments:         Anesthesia Quick Evaluation

## 2019-03-15 NOTE — Anesthesia Postprocedure Evaluation (Signed)
Anesthesia Post Note  Patient: Elizabeth Bullock  Procedure(s) Performed: LEFT BREAST LUMPECTOMY WITH RADIOACTIVE SEED AND AXILLARY SENTINEL LYMPH NODE BIOPSY (Left Breast)     Patient location during evaluation: PACU Anesthesia Type: General Level of consciousness: awake and alert Pain management: pain level controlled Vital Signs Assessment: post-procedure vital signs reviewed and stable Respiratory status: spontaneous breathing, nonlabored ventilation, respiratory function stable and patient connected to nasal cannula oxygen Cardiovascular status: blood pressure returned to baseline and stable Postop Assessment: no apparent nausea or vomiting Anesthetic complications: no    Last Vitals:  Vitals:   03/15/19 0915 03/15/19 0917  BP:  123/73  Pulse: 74 62  Resp: 13 13  Temp:    SpO2: 95% 96%    Last Pain:  Vitals:   03/15/19 0602  PainSc: 0-No pain                 [name] S

## 2019-03-15 NOTE — Transfer of Care (Signed)
Immediate Anesthesia Transfer of Care Note  Patient: Elizabeth Bullock  Procedure(s) Performed: LEFT BREAST LUMPECTOMY WITH RADIOACTIVE SEED AND AXILLARY SENTINEL LYMPH NODE BIOPSY (Left Breast)  Patient Location: PACU  Anesthesia Type:General  Level of Consciousness: awake, patient cooperative and responds to stimulation  Airway & Oxygen Therapy: Patient Spontanous Breathing  Post-op Assessment: Report given to RN, Post -op Vital signs reviewed and stable and Patient moving all extremities X 4  Post vital signs: Reviewed and stable  Last Vitals:  Vitals Value Taken Time  BP 123/78 03/15/19 0847  Temp    Pulse 89 03/15/19 0849  Resp 13 03/15/19 0849  SpO2 94 % 03/15/19 0849  Vitals shown include unvalidated device data.  Last Pain:  Vitals:   03/15/19 0602  PainSc: 0-No pain         Complications: No apparent anesthesia complications

## 2019-03-15 NOTE — Discharge Instructions (Signed)
CENTRAL  SURGERY - DISCHARGE INSTRUCTIONS TO PATIENT  Activity:  Driving - May drive in 2 or 3 days, if doing well   Lifting - No lifting more than 15 pounds for one week, then no limit                       Practice you Covid-19 protection:  Wear a mask, social distance, and wash your hands frequently  Wound Care:   Leave the incision dry for 2 days, then you may shower  Diet:  As tolerated  Follow up appointment:  Call Dr. Pollie Friar office [facility]) at [phone] for an appointment in 2 - 3 weeks.  Medications and dosages:  Resume your home medications.  You have a prescription for:  Vicodin  Call Dr. [name] or his office  [phone]) if you have:  Temperature greater than 100.4,  Persistent nausea and vomiting,  Severe uncontrolled pain,  Redness, tenderness, or signs of infection (pain, swelling, redness, odor or green/yellow discharge around the site),  Difficulty breathing, headache or visual disturbances,  Any other questions or concerns you may have after discharge.  In an emergency, call 911 or go to an Emergency Department at a nearby hospital.

## 2019-03-15 NOTE — Interval H&P Note (Signed)
History and Physical Interval Note:  03/15/2019 6:50 AM  Elizabeth Bullock  has presented today for surgery, with the diagnosis of LEFT BREAST CANCER.  The various methods of treatment have been discussed with the patient and family.  Her father, Hollie Salk, is with her today.  After consideration of risks, benefits and other options for treatment, the patient has consented to  Procedure(s) with comments: LEFT BREAST LUMPECTOMY WITH RADIOACTIVE SEED AND AXILLARY SENTINEL LYMPH NODE BIOPSY (Left) - PEC BLOCK as a surgical intervention.  The patient's history has been reviewed, patient examined, no change in status, stable for surgery.  I have reviewed the patient's chart and labs.  Questions were answered to the patient's satisfaction.     Shann Medal

## 2019-03-15 NOTE — Op Note (Signed)
03/15/2019  8:43 AM  PATIENT:  Elizabeth Bullock DOB: January 25, 1966 MRN: [id number]  PREOP DIAGNOSIS:   LEFT BREAST CANCER  POSTOP DIAGNOSIS:    Left breast cancer, 12 o'clock position (T1, N0)  PROCEDURE:   Procedure(s):  LEFT BREAST LUMPECTOMY WITH RADIOACTIVE SEED AND AXILLARY SENTINEL LYMPH NODE BIOPSY, Injection of peri areolar area of breast with methylene blue (1.0 cc), deep sentinel lymph node biopsy  SURGEON:   [name], M.D.  ANESTHESIA:   General  Anesthesiologist: Myrtie Soman, MD CRNA: Verdie Drown, CRNA; Everlean Cherry A, CRNA  General  EBL:  50  ml  DRAINS:  none   LOCAL MEDICATIONS USED:   25 cc 1/4% marcaine  SPECIMEN:   Left breast lumpectomy (6 color paint kit), left axillary SLNBx (Counts 140, background 5, not blue)  COUNTS CORRECT:  YES  INDICATIONS FOR PROCEDURE:  Elizabeth Bullock is a 53 y.o. (DOB: 1966/03/27) white female whose primary care physician is No primary care provider on file. and comes for left breast lumpectomy and left axillary sentinel lymph node biopsy.   She [facility] with Drs. Gudena and Kinard.   The options for breast cancer treatment have been discussed with the patient. She elected to proceed with lumpectomy and axillary sentinel lymph node.     The indications and potential complications of surgery were explained to the patient. Potential complications include, but are not limited to, bleeding, infection, the need for further surgery, and nerve injury.     She had a I131 seed placed on 03/14/2019 in her left breast at [facility].  The seed is in the 12 o'clock position of the left breast.   In the holding area, her left areola was injected with 1 millicurie of Technitium Sulfur Colloid.  OPERATIVE NOTE:   The patient was taken to operating room # 1 at [facility] where she underwent a general anesthesia  supervised by Anesthesiologist: Myrtie Soman, MD CRNA: Verdie Drown,  CRNA; Colin Benton, CRNA. Her left breast and axilla were prepped with  ChloraPrep and sterilely draped.    A time-out and the surgical check list was reviewed.    I injected about 1.0 mL of 40% methylene blue around her left areola.   The cancer was about at the 12 o'clock position of the left breast.   It was 3 cm from the areola.    I made an incision directly over the cancer. I used the Neoprobe to identify the I131 seed.  I tried to excise an area around the tumor of at least 1 cm.    I excised this block of breast tissue approximately 3 cm by 4 cm  in diameter.   I painted the lumpectomy specimen with the 6 color paint kit and did a specimen mammogram which confirmed the mass, clip, and the seed were all in the right position in the specimen.  The specimen was sent to pathology who called back to confirm that they have the seed and the specimen.   I then started the left deep axillary sentinel lymph node biopsy. I made an incision in the left axilla.  I found a hot area at the junction of the breast and the pectoralis major muscle, deep in the axilla. I cut down and  identified a hot node that had counts of 140 and the background has 5 counts. The lymph node was not blue. I checked her internal mammary nodes and supraclavicular  nodes with the neoprobe and found no other hot area. The axillary node was then sent to pathology.    I then irrigated the wound with saline. I infiltrated approximately 25 mL of 1/4% Marcaine between the incisions. I placed 4 clips to mark biopsy cavity, at 12, 3, 6, and 9 o'clock.  I then closed all the wounds in layers using 3-0 Vicryl sutures for the deep layer. At the skin, I closed the incisions with a 4-0 Monocryl suture. The incisions were then painted with Dermabond.  She had gauze place over the wounds and placed in a breast binder.   The patient tolerated the procedure well, was transported to the recovery room in good condition. Sponge and needle count were  correct at the end of the case.   Final pathology is pending.   [name], MD, [facility] Surgery Pager: [phone] Office phone:  [phone]

## 2019-03-15 NOTE — Anesthesia Procedure Notes (Signed)
Anesthesia Procedure Image    

## 2019-03-15 NOTE — Anesthesia Procedure Notes (Signed)
Anesthesia Regional Block: Pectoralis block   Pre-Anesthetic Checklist: ,, timeout performed, Correct Patient, Correct Site, Correct Laterality, Correct Procedure, Correct Position, site marked, Risks and benefits discussed,  Surgical consent,  Pre-op evaluation,  At surgeon's request and post-op pain management  Laterality: Left  Prep: chloraprep       Needles:  Injection technique: Single-shot  Needle Type: Echogenic Needle     Needle Length: 9cm      Additional Needles:   Procedures:,,,, ultrasound used (permanent image in chart),,,,  Narrative:  Start time: 03/15/2019 6:54 AM End time: 03/15/2019 7:05 AM Injection made incrementally with aspirations every 5 mL.  Performed by: Personally  Anesthesiologist: Myrtie Soman, MD  Additional Notes: Patient tolerated the procedure well without complications

## 2019-03-16 ENCOUNTER — Encounter (HOSPITAL_COMMUNITY): Payer: Self-pay | Admitting: Surgery

## 2019-03-17 LAB — SURGICAL PATHOLOGY

## 2019-03-21 NOTE — Progress Notes (Signed)
Patient Care Team: Patient, No Pcp Per as PCP - General (General Practice) [name], RN as Oncology Nurse Navigator [name], RN as Oncology Nurse Navigator [name], MD as Consulting Physician (General Surgery) [name], MD as Consulting Physician (Hematology and Oncology) [name], MD as Consulting Physician (Radiation Oncology)  DIAGNOSIS:    ICD-10-CM   1. Malignant neoplasm of upper-inner quadrant of left breast in female, estrogen receptor positive ([place])  C50.212    Z17.0     SUMMARY OF ONCOLOGIC HISTORY: Oncology History  Malignant neoplasm of upper-inner quadrant of left breast in female, estrogen receptor positive ([place])  02/11/2019 Initial Diagnosis   Routine screening mammogram detected a 67m mass with associated architectural distortion in the upper left breast, no axillary adenopathy. 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   02/16/2019 Cancer Staging   Staging form: Breast, AJCC 8th Edition - Clinical stage from 02/16/2019: Stage IA (cT1a, cN0, cM0, G2, ER+, PR+, HER2-) - Signed by GNicholas Lose MD on 02/16/2019   03/15/2019 Surgery   Left lumpectomy ([name]: IDC with DCIS, 0.3cm, grade 1, clear margins, and 4 left axillary lymph nodes negative.     CHIEF COMPLIANT: Follow-up s/p left lumpectomy to review pathology  INTERVAL HISTORY: SLeina Bullock a 53y.o. with above-mentioned history of left breast cancer. She underwent a left lumpectomy on 03/15/19 with Dr. NLucia Gaskinsfor which pathology showed grade 1 IDC with DCIS, 0.3cm, clear margins, and 4 left axillary lymph nodes negative for carcinoma. She presents to the clinic today to review the pathology report and discuss further treatment.   REVIEW OF SYSTEMS:   Constitutional: Denies fevers, chills or abnormal weight loss Eyes: Denies blurriness of vision Ears, nose, mouth, throat, and face: Denies mucositis or sore  throat Respiratory: Denies cough, dyspnea or wheezes Cardiovascular: Denies palpitation, chest discomfort Gastrointestinal: Denies nausea, heartburn or change in bowel habits Skin: Denies abnormal skin rashes Lymphatics: Denies new lymphadenopathy or easy bruising Neurological: Denies numbness, tingling or new weaknesses Behavioral/Psych: Mood is stable, no new changes  Extremities: No lower extremity edema Breast: s/p left lumpectomy  All other systems were reviewed with the patient and are negative.  I have reviewed the past medical history, past surgical history, social history and family history with the patient and they are unchanged from previous note.  ALLERGIES:  is allergic to codeine; morphine and related; and penicillins.  MEDICATIONS:  Current Outpatient Medications  Medication Sig Dispense Refill  . acetaminophen (TYLENOL) 500 MG tablet Take 1,000 mg by mouth 2 (two) times daily as needed for moderate pain or headache.    . APPLE CIDER VINEGAR PO Take 1 tablet by mouth daily.    . Aspirin-Caffeine (BC FAST PAIN RELIEF PO) Take 1 packet by mouth daily as needed (headache).    .Marland Kitchenatorvastatin (LIPITOR) 20 MG tablet Take 1 tablet (20 mg total) by mouth daily. 90 tablet 1  . HYDROcodone-acetaminophen (NORCO/VICODIN) 5-325 MG tablet Take 1 tablet by mouth every 6 (six) hours as needed for moderate pain. 15 tablet 0  . loratadine (CLARITIN) 10 MG tablet Take 10 mg by mouth daily as needed for allergies.     . Multiple Vitamin (MULTIVITAMIN WITH MINERALS) TABS tablet Take 1 tablet by mouth daily.    .Marland Kitchenomeprazole (PRILOSEC) 20 MG capsule Take 1 capsule (20 mg total) by mouth daily. 30 capsule 0   No current facility-administered medications for  this visit.     PHYSICAL EXAMINATION: ECOG PERFORMANCE STATUS: 1 - Symptomatic but completely ambulatory  Vitals:   03/22/19 0925  BP: (!) 141/85  Pulse: 74  Resp: 18  Temp: 98.2 F (36.8 C)  SpO2: 100%   Filed Weights    03/22/19 0925  Weight: 161 lb 3.2 oz (73.1 kg)    GENERAL: alert, no distress and comfortable SKIN: skin color, texture, turgor are normal, no rashes or significant lesions EYES: normal, Conjunctiva are pink and non-injected, sclera clear OROPHARYNX: no exudate, no erythema and lips, buccal mucosa, and tongue normal  NECK: supple, thyroid normal size, non-tender, without nodularity LYMPH: no palpable lymphadenopathy in the cervical, axillary or inguinal LUNGS: clear to auscultation and percussion with normal breathing effort HEART: regular rate & rhythm and no murmurs and no lower extremity edema ABDOMEN: abdomen soft, non-tender and normal bowel sounds MUSCULOSKELETAL: no cyanosis of digits and no clubbing  NEURO: alert & oriented x 3 with fluent speech, no focal motor/sensory deficits EXTREMITIES: No lower extremity edema  LABORATORY DATA:  I have reviewed the data as listed CMP Latest Ref Rng & Units 03/09/2019 02/16/2019 12/23/2018  Glucose 70 - 99 mg/dL 81 94 100(H)  BUN 6 - 20 mg/dL '6 6 10  '$ Creatinine 0.44 - 1.00 mg/dL 0.86 0.85 0.80  Sodium 135 - 145 mmol/L 139 140 139  Potassium 3.5 - 5.1 mmol/L 3.8 3.5 4.2  Chloride 98 - 111 mmol/L 104 103 102  CO2 22 - 32 mmol/L '25 26 23  '$ Calcium 8.9 - 10.3 mg/dL 9.4 9.1 9.4  Total Protein 6.5 - 8.1 g/dL 7.0 6.6 7.1  Total Bilirubin 0.3 - 1.2 mg/dL 0.7 0.7 0.7  Alkaline Phos 38 - 126 U/L 96 91 86  AST 15 - 41 U/L 20 14(L) 16  ALT 0 - 44 U/L '19 15 17    '$ Lab Results  Component Value Date   WBC 9.7 03/09/2019   HGB 15.7 (H) 03/09/2019   HCT 47.2 (H) 03/09/2019   MCV 89.4 03/09/2019   PLT 260 03/09/2019   NEUTROABS 6.8 02/16/2019    ASSESSMENT & PLAN:  Malignant neoplasm of upper-inner quadrant of left breast in female, estrogen receptor positive ([place]) 03/15/2019:Left lumpectomy [name]): IDC with DCIS, 0.3cm, grade 1, clear margins, and 4 left axillary lymph nodes negative.  ER 100%, PR 90%, Ki-67 10%, HER-2 negative, T1 a N0 stage Ia   Pathology counseling: I discussed the final pathology report of the patient provided  a copy of this report. I discussed the margins as well as lymph node surgeries. We also discussed the final staging along with previously performed ER/PR and HER-2/neu testing.  Recommendation: 1.  Adjuvant radiation therapy followed by 2.  Adjuvant antiestrogen therapy:We will need to check for [facility] and estradiol levels before starting her antiestrogen therapy since she had a prior hysterectomy and does not know if she is truly in menopause.  Severe hot flashes and anxiety spells: I prescribed her Effexor today.  Flu vaccine going to be given today. Return to clinic at the end of radiation therapy to begin antiestrogen therapy based on the blood work being done today.    No orders of the defined types were placed in this encounter.  The patient has a good understanding of the overall plan. she agrees with it. she will call with any problems that may develop before the next visit here.  [name], MD 03/22/2019  Julious Oka Dorshimer am acting as scribe for Dr. Loleta Dicker  Renae Mottley.  I have reviewed the above documentation for accuracy and completeness, and I agree with the above.

## 2019-03-22 ENCOUNTER — Other Ambulatory Visit: Payer: Self-pay

## 2019-03-22 ENCOUNTER — Inpatient Hospital Stay: Payer: Medicaid Other | Attending: Hematology and Oncology | Admitting: Hematology and Oncology

## 2019-03-22 ENCOUNTER — Inpatient Hospital Stay: Payer: Medicaid Other

## 2019-03-22 DIAGNOSIS — Z7982 Long term (current) use of aspirin: Secondary | ICD-10-CM | POA: Diagnosis not present

## 2019-03-22 DIAGNOSIS — C50212 Malignant neoplasm of upper-inner quadrant of left female breast: Secondary | ICD-10-CM

## 2019-03-22 DIAGNOSIS — Z90710 Acquired absence of both cervix and uterus: Secondary | ICD-10-CM | POA: Diagnosis not present

## 2019-03-22 DIAGNOSIS — Z170 Estrogen receptor positive status [ER+]: Secondary | ICD-10-CM

## 2019-03-22 DIAGNOSIS — Z79899 Other long term (current) drug therapy: Secondary | ICD-10-CM | POA: Diagnosis not present

## 2019-03-22 DIAGNOSIS — Z23 Encounter for immunization: Secondary | ICD-10-CM | POA: Insufficient documentation

## 2019-03-22 MED ORDER — INFLUENZA VAC SPLIT QUAD 0.5 ML IM SUSY
0.5000 mL | PREFILLED_SYRINGE | Freq: Once | INTRAMUSCULAR | Status: AC
  Administered 2019-03-22: 10:00:00 0.5 mL via INTRAMUSCULAR

## 2019-03-22 MED ORDER — VENLAFAXINE HCL ER 37.5 MG PO CP24: 37.5000 mg | ORAL_CAPSULE | Freq: Every day | ORAL | 6 refills | Status: DC

## 2019-03-22 MED ORDER — INFLUENZA VAC SPLIT QUAD 0.5 ML IM SUSY
PREFILLED_SYRINGE | INTRAMUSCULAR | Status: AC
  Filled 2019-03-22: qty 0.5

## 2019-03-22 NOTE — Assessment & Plan Note (Signed)
03/15/2019:Left lumpectomy [name]): IDC with DCIS, 0.3cm, grade 1, clear margins, and 4 left axillary lymph nodes negative.  ER 100%, PR 90%, Ki-67 10%, HER-2 negative, T1 a N0 stage Ia  Pathology counseling: I discussed the final pathology report of the patient provided  a copy of this report. I discussed the margins as well as lymph node surgeries. We also discussed the final staging along with previously performed ER/PR and HER-2/neu testing.  Recommendation: 1.  Adjuvant radiation therapy followed by 2.  Adjuvant antiestrogen therapy:We will need to check for [facility] and estradiol levels before starting her antiestrogen therapy since she had a prior hysterectomy and does not know if she is truly in menopause.  Return to clinic at the end of radiation therapy to begin antiestrogen therapy.

## 2019-03-23 LAB — FOLLICLE STIMULATING HORMONE: FSH: 64.7 m[IU]/mL

## 2019-03-24 ENCOUNTER — Telehealth: Payer: Self-pay | Admitting: *Deleted

## 2019-03-24 NOTE — Telephone Encounter (Signed)
Received VM from pt regarding [facility] genetic DNA testing.  RN forwarded message to pt nurse navigator Varney Biles to answer any questions for the pt.

## 2019-03-25 ENCOUNTER — Encounter: Payer: Self-pay | Admitting: *Deleted

## 2019-03-25 ENCOUNTER — Telehealth: Payer: Self-pay | Admitting: Hematology and Oncology

## 2019-03-25 NOTE — Telephone Encounter (Signed)
I informed her that the [facility] was in the menopausal range. After radiation is complete she will be receiving anastrozole therapy.

## 2019-03-28 LAB — ESTRADIOL, ULTRA SENS: Estradiol, Sensitive: 3.1 pg/mL

## 2019-04-04 ENCOUNTER — Ambulatory Visit
Admission: RE | Admit: 2019-04-04 | Discharge: 2019-04-04 | Disposition: A | Discharge status: Home / Self Care | Payer: Medicaid Other | Source: Ambulatory Visit | Attending: Radiation Oncology | Admitting: Radiation Oncology

## 2019-04-04 ENCOUNTER — Telehealth: Payer: Self-pay | Admitting: Hematology and Oncology

## 2019-04-04 ENCOUNTER — Encounter: Payer: Self-pay | Admitting: Radiation Oncology

## 2019-04-04 ENCOUNTER — Other Ambulatory Visit: Payer: Self-pay

## 2019-04-04 VITALS — BP 159/93 | HR 66 | Temp 98.3°F | Resp 18 | Ht 63.0 in | Wt 162.0 lb

## 2019-04-04 DIAGNOSIS — Z170 Estrogen receptor positive status [ER+]: Secondary | ICD-10-CM | POA: Insufficient documentation

## 2019-04-04 DIAGNOSIS — Z79899 Other long term (current) drug therapy: Secondary | ICD-10-CM | POA: Diagnosis not present

## 2019-04-04 DIAGNOSIS — Z510 Encounter for antineoplastic radiation therapy: Secondary | ICD-10-CM | POA: Insufficient documentation

## 2019-04-04 DIAGNOSIS — C50212 Malignant neoplasm of upper-inner quadrant of left female breast: Secondary | ICD-10-CM | POA: Insufficient documentation

## 2019-04-04 NOTE — Telephone Encounter (Signed)
Scheduled appt per 10/5 sch message  - pt aware of appt date and time  

## 2019-04-04 NOTE — Progress Notes (Signed)
Location of Breast Cancer: Malignant neoplasm of upper-inner quadrant of left breast in female, estrogen receptor positive ([place])   Histology per Pathology Report: 03/15/19:  DIAGNOSIS:   A. BREAST, LEFT, LUMPECTOMY:  - Invasive ductal carcinoma, 0.3 cm, Nottingham grade 1 of 3.  - Margins of resection are not involved (Closest margin: 7 mm, medial).  - Ductal carcinoma in situ (focal).  - Biopsy site changes.  - See oncology table.   B. SENTINEL LYMPH NODE, LEFT AXILLARY, BIOPSY:  - One lymph node, negative for carcinoma (0/1).   C. SENTINEL LYMPH NODE, LEFT AXILLARY, BIOPSY:  - One lymph node, negative for carcinoma (0/1).   D. SENTINEL LYMPH NODE, LEFT AXILLARY, BIOPSY:  - One lymph node, negative for carcinoma (0/1).   E. SENTINEL LYMPH NODE, LEFT AXILLARY, BIOPSY:  - One lymph node, negative for carcinoma (0/1).   Receptor Status: ER(100%), PR (100%), Her2-neu (negative), Ki-(10%)  Did patient present with symptoms (if so, please note symptoms) or was this found on screening mammography?: She had routine screening mammography on 01/12/2019 showing a possible abnormality in the left breast. She underwent left diagnostic mammography with tomography and left breast ultrasonography at [facility] on 02/01/2019 showing: 4 mm mass with associated architectural distortion at about 11:45, highly suspicious for malignancy.  Biopsy on 02/09/2019 showed: invasive ductal carcinoma, grade 1; DCIS, which is the majority of the biopsy. Prognostic indicators significant for: estrogen receptor, 100% positive and progesterone receptor, 100% positive, both with strong staining intensity. Proliferation marker Ki67 at 10%. HER2 negative.  Past/Anticipated interventions by surgeon, if any: 03/15/19: PROCEDURE:   Procedure(s):  LEFT BREAST LUMPECTOMY WITH RADIOACTIVE SEED AND AXILLARY SENTINEL LYMPH NODE BIOPSY, Injection of peri areolar area of breast with methylene blue (1.0 cc), deep sentinel lymph  node biopsy  SURGEON:   [name], M.D.  Past/Anticipated interventions by medical oncology, if any: Chemotherapy Per Dr. Lindi Adie 03/22/19:  ASSESSMENT & PLAN:  Malignant neoplasm of upper-inner quadrant of left breast in female, estrogen receptor positive ([place]) 03/15/2019:Left lumpectomy [name]): IDC with DCIS, 0.3cm, grade 1, clear margins, and 4 left axillary lymph nodes negative.  ER 100%, PR 90%, Ki-67 10%, HER-2 negative, T1 a N0 stage Ia  Pathology counseling: I discussed the final pathology report of the patient provided  a copy of this report. I discussed the margins as well as lymph node surgeries. We also discussed the final staging along with previously performed ER/PR and HER-2/neu testing.  Recommendation: 1.  Adjuvant radiation therapy followed by 2.  Adjuvant antiestrogen therapy:We will need to check for [facility] and estradiol levels before starting her antiestrogen therapy since she had a prior hysterectomy and does not know if she is truly in menopause.  Severe hot flashes and anxiety spells: I prescribed her Effexor today.  Flu vaccine going to be given today. Return to clinic at the end of radiation therapy to begin antiestrogen therapy based on the blood work being done today.  Lymphedema issues, if any:  Pt denies s/s lymphedema.  Pain issues, if any:  Pt denies c/o pain.  SAFETY ISSUES:  Prior radiation? No  Pacemaker/ICD? No  Possible current pregnancy? No  Is the patient on methotrexate? No  Current Complaints / other details:  Pt presents today for consult with Dr. Sondra Come for Radiation Oncology.  BP (!) 159/93 (BP Location: Right Arm, Patient Position: Sitting)   Pulse 66   Temp 98.3 F (36.8 C) (Temporal)   Resp 18   Ht _0  (1.6  m)   Wt 162 lb (73.5 kg)   SpO2 100%   BMI 28.70 kg/m   Wt Readings from Last 3 Encounters:  04/04/19 162 lb (73.5 kg)  03/22/19 161 lb 3.2 oz (73.1 kg)  03/15/19 161 lb (73 kg)       Loma Sousa,  RN 04/04/2019,1:11 PM

## 2019-04-04 NOTE — Progress Notes (Signed)
Radiation Oncology         [phone]) [phone] ________________________________  Name: Elizabeth Bullock MRN: [id number]  Date: 04/04/2019  DOB: 10-02-1965  Re-Evaluation Note  CC: Patient, No Pcp Per  [name], MD    ICD-10-CM   1. Malignant neoplasm of upper-inner quadrant of left breast in female, estrogen receptor positive ([place])  C50.212    Z17.0     Diagnosis:   Stage IA (pT1a,pN0) Left Breast UIQ, Invasive Ductal Carcinoma with DCIS, ER+ / PR+ / Her2-, Grade 1  Narrative:  The patient returns today to discuss radiation treatment options. She was seen in the multidisciplinary breast clinic on 02/16/2019.   She opted to proceed with left lumpectomy with axillary lymph node biopsy on 03/15/2019. Pathology from the procedure revealed: invasive ductal carcinoma, 0.3 cm, grade 1; negative margins; focal DCIS; negative lymph nodes (0/4).  On review of systems, the patient reports mild discomfort in the left breast. She denies any numbness or swelling in her left arm or hand and any other symptoms.    Allergies:  is allergic to codeine; morphine and related; and penicillins.  Meds: Current Outpatient Medications  Medication Sig Dispense Refill   acetaminophen (TYLENOL) 500 MG tablet Take 1,000 mg by mouth 2 (two) times daily as needed for moderate pain or headache.     APPLE CIDER VINEGAR PO Take 1 tablet by mouth daily.     Aspirin-Caffeine (BC FAST PAIN RELIEF PO) Take 1 packet by mouth daily as needed (headache).     atorvastatin (LIPITOR) 20 MG tablet Take 1 tablet (20 mg total) by mouth daily. 90 tablet 1   loratadine (CLARITIN) 10 MG tablet Take 10 mg by mouth daily as needed for allergies.      Multiple Vitamin (MULTIVITAMIN WITH MINERALS) TABS tablet Take 1 tablet by mouth daily.     omeprazole (PRILOSEC) 20 MG capsule Take 1 capsule (20 mg total) by mouth daily. 30 capsule 0   venlafaxine XR (EFFEXOR XR) 37.5 MG 24 hr capsule Take 1 capsule (37.5 mg total) by mouth  daily with breakfast. 30 capsule 6   No current facility-administered medications for this encounter.     Physical Findings: The patient is in no acute distress. Patient is alert and oriented.  height is '5\' 3"'  (1.6 m) and weight is 162 lb (73.5 kg). Her temporal temperature is 98.3 F (36.8 C). Her blood pressure is 159/93 (abnormal) and her pulse is 66. Her respiration is 18 and oxygen saturation is 100%.  No significant changes. Lungs are clear to auscultation bilaterally. Heart has regular rate and rhythm. No palpable cervical, supraclavicular, or axillary adenopathy. Abdomen soft, non-tender, normal bowel sounds. Right Breast: no palpable mass, nipple discharge or bleeding. Left Breast: Well-healing scar in the upper aspect of the breast.  The patient also has a separate scar in the axillary region from her sentinel node procedure.  No dominant mass appreciated breast nipple discharge or bleeding.  No signs of infection in the breast.  Patient has some slight erythema to the lateral aspect of her lumpectomy scar.  No drainage.  Lab Findings: Lab Results  Component Value Date   WBC 9.7 03/09/2019   HGB 15.7 (H) 03/09/2019   HCT 47.2 (H) 03/09/2019   MCV 89.4 03/09/2019   PLT 260 03/09/2019    Radiographic Findings: Nm Sentinel Node Inj-no Rpt (breast)  Result Date: 03/15/2019 Sulfur colloid was injected by the nuclear medicine technologist for melanoma sentinel node.   Mm Breast  Surgical Specimen  Result Date: 03/15/2019 CLINICAL DATA:  53 year old female post left breast lumpectomy. EXAM: SPECIMEN RADIOGRAPH OF THE LEFT BREAST COMPARISON:  Previous exam(s). FINDINGS: Status post excision of the left breast. The radioactive seed and coil shaped biopsy marker clip are present, completely intact, and were marked for pathology. IMPRESSION: Specimen radiograph of the left breast. Electronically Signed   By: Everlean Alstrom M.D.   On: 03/15/2019 08:16   Mm Lt Radioactive Seed Loc Mammo  Guide  Result Date: 03/14/2019 CLINICAL DATA:  53 year old female presenting for radioactive seed localization of the left breast prior to lumpectomy. EXAM: MAMMOGRAPHIC GUIDED RADIOACTIVE SEED LOCALIZATION OF THE LEFT BREAST COMPARISON:  Previous exam(s). FINDINGS: Patient presents for radioactive seed localization prior to left breast lumpectomy. I met with the patient and we discussed the procedure of seed localization including benefits and alternatives. We discussed the high likelihood of a successful procedure. We discussed the risks of the procedure including infection, bleeding, tissue injury and further surgery. We discussed the low dose of radioactivity involved in the procedure. Informed, written consent was given. The usual time-out protocol was performed immediately prior to the procedure. Using mammographic guidance, sterile technique, 1% lidocaine and an I-125 radioactive seed, the coil shaped biopsy marking clip was localized using a superior approach. The follow-up mammogram images confirm the seed in the expected location and were marked for Dr. [name]. The seed is approximately 4 mm posterior to the coil shaped biopsy marker. Follow-up survey of the patient confirms presence of the radioactive seed. Order number of I-125 seed:  [id number]. Total activity:  3.354 millicuries reference Date: 03/10/2019 The patient tolerated the procedure well and was released from the [facility]. She was given instructions regarding seed removal. IMPRESSION: Radioactive seed localization left breast. The seed is approximately 4 mm posterior to the coil shaped biopsy marking clip. No apparent complications. Electronically Signed   By: Ammie Ferrier M.D.   On: 03/14/2019 13:27    Impression:  Stage IA (pT1a,pN0) Left Breast UIQ, Invasive Ductal Carcinoma with DCIS, ER+ / PR+ / Her2-, Grade 1.  Patient will be a good candidate for breast conservation with radiation therapy directed at the left breast.   Patient has a close DCIS margin but adequate if she proceeds with radiation therapy.  Today, I talked to the patient about the findings and work-up thus far.  We discussed the natural history of breast cancer and general treatment, highlighting the role of radiotherapy in the management.  We discussed the available radiation techniques, and focused on the details of logistics and delivery.  We reviewed the anticipated acute and late sequelae associated with radiation in this setting.  The patient was encouraged to ask questions that I answered to the best of my ability.  A patient consent form was discussed and signed.  We retained a copy for our records.  The patient would like to proceed with radiation and will be scheduled for CT simulation.  Plan:  Patient is scheduled for CT simulation later today.  Treatments to begin approximately 4 and half weeks postop.  Patient would be a good candidate for hypofractionated accelerated radiation therapy over approximately 4 weeks.  Will use cardiac sparing techniques if necessary.  ____________________________________ [name], MD   This document serves as a record of services personally performed by [name], MD. It was created on his behalf by Wilburn Mylar, a trained medical scribe. The creation of this record is based on the scribe's personal observations and the  provider's statements to them. This document has been checked and approved by the attending provider.

## 2019-04-04 NOTE — Patient Instructions (Signed)
Coronavirus (COVID-19) Are you at risk?  Are you at risk for the Coronavirus (COVID-19)?  To be considered HIGH RISK for Coronavirus (COVID-19), you have to meet the following criteria:  . Traveled to [place], [place], [place], [place] or [place]; or in the [place] to [place], [place], [place], or [place]; and have fever, cough, and shortness of breath within the last 2 weeks of travel OR . Been in close contact with a person diagnosed with COVID-19 within the last 2 weeks and have fever, cough, and shortness of breath . IF YOU DO NOT MEET THESE CRITERIA, YOU ARE CONSIDERED LOW RISK FOR COVID-19.  What to do if you are HIGH RISK for COVID-19?  . If you are having a medical emergency, call 911. . Seek medical care right away. Before you go to a doctor's office, urgent care or emergency department, call ahead and tell them about your recent travel, contact with someone diagnosed with COVID-19, and your symptoms. You should receive instructions from your physician's office regarding next steps of care.  . When you arrive at healthcare provider, tell the healthcare staff immediately you have returned from visiting [place], [place], [place], [place] or [place]; or traveled in the [place] to [place], [place], [place], or [place]; in the last two weeks or you have been in close contact with a person diagnosed with COVID-19 in the last 2 weeks.   . Tell the health care staff about your symptoms: fever, cough and shortness of breath. . After you have been seen by a medical provider, you will be either: o Tested for (COVID-19) and discharged home on quarantine except to seek medical care if symptoms worsen, and asked to  - Stay home and avoid contact with others until you get your results (4-5 days)  - Avoid travel on public transportation if possible (such as bus, train, or airplane) or o Sent to the Emergency Department by EMS for evaluation, COVID-19 testing, and possible  admission depending on your condition and test results.  What to do if you are LOW RISK for COVID-19?  Reduce your risk of any infection by using the same precautions used for avoiding the common cold or flu:  . Wash your hands often with soap and warm water for at least 20 seconds.  If soap and water are not readily available, use an alcohol-based hand sanitizer with at least 60% alcohol.  . If coughing or sneezing, cover your mouth and nose by coughing or sneezing into the elbow areas of your shirt or coat, into a tissue or into your sleeve (not your hands). . Avoid shaking hands with others and consider head nods or verbal greetings only. . Avoid touching your eyes, nose, or mouth with unwashed hands.  . Avoid close contact with people who are sick. . Avoid places or events with large numbers of people in one location, like concerts or sporting events. . Carefully consider travel plans you have or are making. . If you are planning any travel outside or inside the [place], visit the CDC's Travelers' Health webpage for the latest health notices. . If you have some symptoms but not all symptoms, continue to monitor at home and seek medical attention if your symptoms worsen. . If you are having a medical emergency, call 911.   ADDITIONAL HEALTHCARE OPTIONS FOR PATIENTS  [facility] / e-Visit: [email or web address]         [facility]: [phone]  [facility]: [phone]                   [facility]: [phone]   

## 2019-04-04 NOTE — Progress Notes (Signed)
  Radiation Oncology         [phone]) [phone] ________________________________  Name: Elizabeth Bullock MRN: [id number]  Date: 04/04/2019  DOB: [birth year]  SIMULATION AND TREATMENT PLANNING NOTE    ICD-10-CM   1. Malignant neoplasm of upper-inner quadrant of left breast in female, estrogen receptor positive ([place])  C50.212    Z17.0     DIAGNOSIS:  StageIA(pT1a, pN0)LeftBreast UIQ,Invasive DuctalCarcinoma with DCIS, ER+/ PR+/ Her2-, Grade1  NARRATIVE:  The patient was brought to the [facility].  Identity was confirmed.  All relevant records and images related to the planned course of therapy were reviewed.  The patient freely provided informed written consent to proceed with treatment after reviewing the details related to the planned course of therapy. The consent form was witnessed and verified by the simulation staff.  Then, the patient was set-up in a stable reproducible  supine position for radiation therapy.  CT images were obtained.  Surface markings were placed.  The CT images were loaded into the planning software.  Then the target and avoidance structures were contoured.  Treatment planning then occurred.  The radiation prescription was entered and confirmed.  Then, I designed and supervised the construction of a total of 5 medically necessary complex treatment devices.  I have requested : 3D Simulation  I have requested a DVH of the following structures: Heart, lungs, lumpectomy cavity.  I have ordered:dose calc.  PLAN:  The left breast will receive 40.05 in 15 fractions directed at the left breast.  The lumpectomy cavity will receive a boost of 12 Gy in 6 fractions.    Optical Surface Tracking Plan:  Since intensity modulated radiotherapy (IMRT) and 3D conformal radiation treatment methods are predicated on accurate and precise positioning for treatment, intrafraction motion monitoring is medically necessary to ensure accurate and safe treatment delivery.  The  ability to quantify intrafraction motion without excessive ionizing radiation dose can only be performed with optical surface tracking. Accordingly, surface imaging offers the opportunity to obtain 3D measurements of patient position throughout IMRT and 3D treatments without excessive radiation exposure.  I am ordering optical surface tracking for this patient's upcoming course of radiotherapy. ________________________________    Blair Promise, PhD, MD  This document serves as a record of services personally performed by [name], MD. It was created on his behalf by Wilburn Mylar, a trained medical scribe. The creation of this record is based on the scribe's personal observations and the provider's statements to them. This document has been checked and approved by the attending provider.

## 2019-04-07 ENCOUNTER — Other Ambulatory Visit: Payer: Self-pay

## 2019-04-07 ENCOUNTER — Ambulatory Visit: Payer: Medicaid Other | Attending: Surgery | Admitting: Physical Therapy

## 2019-04-07 ENCOUNTER — Encounter: Payer: Self-pay | Admitting: Physical Therapy

## 2019-04-07 DIAGNOSIS — Z483 Aftercare following surgery for neoplasm: Secondary | ICD-10-CM | POA: Diagnosis present

## 2019-04-07 DIAGNOSIS — C50212 Malignant neoplasm of upper-inner quadrant of left female breast: Secondary | ICD-10-CM | POA: Insufficient documentation

## 2019-04-07 DIAGNOSIS — Z170 Estrogen receptor positive status [ER+]: Secondary | ICD-10-CM | POA: Diagnosis present

## 2019-04-07 DIAGNOSIS — R293 Abnormal posture: Secondary | ICD-10-CM

## 2019-04-07 NOTE — Therapy (Signed)
[place], [place], [address] Phone: [phone]   Fax:  [phone]  Physical Therapy Treatment  Patient Details  Name: Elizabeth Bullock MRN: [id number] Date of Birth: 04/26/1966 Referring Provider (PT): Dr. [name]   Encounter Date: 04/07/2019  PT End of Session - 04/07/19 1025    Visit Number  1    Number of Visits  1    PT Start Time  1005    PT Stop Time  1035    PT Time Calculation (min)  30 min    Activity Tolerance  Patient tolerated treatment well    Behavior During Therapy  [facility] for tasks assessed/performed       Past Medical History:  Diagnosis Date  . Anxiety   . Bipolar disorder ([place])   . Bronchitis, chronic ([place])   . COPD (chronic obstructive pulmonary disease) ([place])   . Degenerative disorder of bone    OF BACK  . Depression   . GERD (gastroesophageal reflux disease)   . Headache(784.0)    History of migraines,  takes propanolol for headaches  . High cholesterol   . Hypertension   . Tobacco abuse     Past Surgical History:  Procedure Laterality Date  . ABDOMINAL HYSTERECTOMY    . ANKLE SURGERY Left    left ankle surgery as teenager per pt  . BREAST LUMPECTOMY WITH RADIOACTIVE SEED AND SENTINEL LYMPH NODE BIOPSY Left 03/15/2019   Procedure: LEFT BREAST LUMPECTOMY WITH RADIOACTIVE SEED AND AXILLARY SENTINEL LYMPH NODE BIOPSY;  Surgeon: [name], MD;  Location: [facility];  Service: General;  Laterality: Left;  . CHOLECYSTECTOMY  03/13/2011   Procedure: LAPAROSCOPIC CHOLECYSTECTOMY;  Surgeon: Donato Heinz;  Location: [facility];  Service: General;  Laterality: N/A;    There were no vitals filed for this visit.  Subjective Assessment - 04/07/19 1008    Subjective  Patient underwent a left lumpectomy and sentinel node biopsy (0/3 nodes positive) on 03/15/2019. She begin radiation on 03/13/2019.    Pertinent History  Patient was diagnosed on 01/12/2019 with left grade I  invasive ductal carcinoma breast cancer. Patient underwent a left lumpectomy and sentinel node biopsy (0/3 nodes positive) on 03/15/2019. It is ER/PR positive and HER2 negative with a Ki67 of 10%. She smokes 1 pack per day, has hypertension and bipolar disorder although reports she will not take medication for her bipolar disorder.    Patient Stated Goals  See if my arm is ok    Currently in Pain?  No/denies         [facility] PT Assessment - 04/07/19 0001      Assessment   Medical Diagnosis  s/p left lumpectomy and SLNB    Referring Provider (PT)  Dr. [name]    Onset Date/Surgical Date  03/15/19    Hand Dominance  Left    Prior Therapy  Baselines      Precautions   Precautions  Other (comment)    Precaution Comments  recent surgery      Restrictions   Weight Bearing Restrictions  No      Balance Screen   Has the patient fallen in the past 6 months  No    Has the patient had a decrease in activity level because of a fear of falling?   No    Is the patient reluctant to leave their home because of a fear of falling?   No      Home Environment  Living Environment  Private residence    Living Arrangements  Parent   Lives with and cares for both parents   Available Help at Discharge  Family      Prior Function   Level of Liberty  Unemployed    Leisure  She is walking 4 days per week at [employer]   Overall Cognitive Status  Within Functional Limits for tasks assessed      Observation/Other Assessments   Observations  Incision in left axilla appears to be well healed. Incision on superior left breast is slightly red and angry but she reports that is due to having to pull a stitch out.      Posture/Postural Control   Posture/Postural Control  Postural limitations    Postural Limitations  Rounded Shoulders;Forward head      ROM / Strength   AROM / PROM / Strength  AROM      AROM   AROM Assessment Site  Shoulder    Right/Left  Shoulder  Left    Left Shoulder Extension  65 Degrees    Left Shoulder Flexion  145 Degrees    Left Shoulder ABduction  172 Degrees    Left Shoulder Internal Rotation  62 Degrees    Left Shoulder External Rotation  82 Degrees      Strength   Overall Strength  Within functional limits for tasks performed        LYMPHEDEMA/ONCOLOGY QUESTIONNAIRE - 04/07/19 1014      Type   Cancer Type  Left breast cancer      Surgeries   Lumpectomy Date  03/15/19    Sentinel Lymph Node Biopsy Date  03/15/19    Number Lymph Nodes Removed  4      Treatment   Active Chemotherapy Treatment  No    Past Chemotherapy Treatment  No    Active Radiation Treatment  No    Past Radiation Treatment  No    Current Hormone Treatment  No    Past Hormone Therapy  No      What other symptoms do you have   Are you Having Heaviness or Tightness  No    Are you having Pain  No    Are you having pitting edema  No    Is it Hard or Difficult finding clothes that fit  No    Do you have infections  No    Is there Decreased scar mobility  No    Stemmer Sign  No      Lymphedema Assessments   Lymphedema Assessments  Upper extremities      Right Upper Extremity Lymphedema   10 cm Proximal to Olecranon Process  29.4 cm    Olecranon Process  23.6 cm    10 cm Proximal to Ulnar Styloid Process  22.6 cm    Just Proximal to Ulnar Styloid Process  16.6 cm    Across Hand at [employer]  19.7 cm    At [place] of 2nd Digit  6.7 cm      Left Upper Extremity Lymphedema   10 cm Proximal to Olecranon Process  30 cm    Olecranon Process  24.6 cm    10 cm Proximal to Ulnar Styloid Process  23.2 cm    Just Proximal to Ulnar Styloid Process  16 cm    Across Hand at [employer]  19.6 cm    At [place] of  2nd Digit  6.6 cm        Quick Dash - 04/07/19 0001    Open a tight or new jar  No difficulty    Do heavy household chores (wash walls, wash floors)  Moderate difficulty    Carry a shopping bag or briefcase  No  difficulty    Wash your back  Moderate difficulty    Use a knife to cut food  No difficulty    Recreational activities in which you take some force or impact through your arm, shoulder, or hand (golf, hammering, tennis)  No difficulty    During the past week, to what extent has your arm, shoulder or hand problem interfered with your normal social activities with family, friends, neighbors, or groups?  Slightly    During the past week, to what extent has your arm, shoulder or hand problem limited your work or other regular daily activities  Slightly    Arm, shoulder, or hand pain.  Moderate    Tingling (pins and needles) in your arm, shoulder, or hand  None    Difficulty Sleeping  Mild difficulty    DASH Score  20.45 %                     PT Education - 04/07/19 1023    Education Details  Lymphedema risk reduction practices    Person(s) Educated  Patient    Methods  Explanation;Handout    Comprehension  Verbalized understanding          PT Long Term Goals - 04/07/19 1035      PT LONG TERM GOAL #1   Title  Patient will demonstrate she has has regained full shoulder ROM and function post operatively compared to baseline assessment.    Time  8    Period  Weeks    Status  Achieved            Plan - 04/07/19 1025    Clinical Impression Statement  Paitent is doing very well s/p left lumpectomy and sentinel node biopsy. Her shoulder ROM and function is back to baseline and slightly better than baseline due to doing her HEP. There is no sign of edema in her arm or breast. Her breast incision is slightly red but she attributes that to having a stitch removed. She was educated on infection signs and what to do in case of infection. She will participate in the After Breast Cancer class on 05/16/2019 to learn about lymphedema risk reduction. Otherwise, she has no PT needs at this time.    PT Treatment/Interventions  ADLs/Self Care Home Management;Therapeutic  exercise;Patient/family education    PT Next Visit Plan  D/C    PT Home Exercise Plan  Post op shoulder ROM HEP    Consulted and Agree with Plan of Care  Patient       Patient will benefit from skilled therapeutic intervention in order to improve the following deficits and impairments:  Postural dysfunction, Decreased range of motion, Decreased knowledge of precautions, Impaired UE functional use, Pain  Visit Diagnosis: Malignant neoplasm of upper-inner quadrant of left breast in female, estrogen receptor positive ([facility])  Abnormal posture  Aftercare following surgery for neoplasm     Problem List Patient Active Problem List   Diagnosis Date Noted  . Malignant neoplasm of upper-inner quadrant of left breast in female, estrogen receptor positive ([place]) 02/11/2019  . Screening breast examination 02/08/2019  . Nausea with vomiting 02/09/2015  . Pain in  the chest   . Gastroenteritis   . Hypertension   . Tobacco abuse   . Chest pain 02/08/2015   PHYSICAL THERAPY DISCHARGE SUMMARY  Visits from Start of Care: 2  Current functional level related to goals / functional outcomes: Goals met. See above for objective findings.   Remaining deficits: None   Education / Equipment: HEP and lymphedema risk reductrion Plan: Patient agrees to discharge.  Patient goals were met. Patient is being discharged due to meeting the stated rehab goals.  ?????         Annia Friendly, [place] 04/07/19 10:37 AM  [place], [place], [address] Phone: [phone]   Fax:  [phone]  Name: Deborrah Mabin MRN: [id number] Date of Birth: 1965/08/06

## 2019-04-11 DIAGNOSIS — Z510 Encounter for antineoplastic radiation therapy: Secondary | ICD-10-CM | POA: Diagnosis not present

## 2019-04-11 NOTE — Progress Notes (Signed)
[facility] Psychosocial Distress Screening Clinical Social Work  Clinical Social Work was referred by distress screening protocol.  The patient scored a 8 on the Psychosocial Distress Thermometer which indicates severe distress. Clinical Social Worker contacted patient by phone to assess for distress and other psychosocial needs.  ONCBCN DISTRESS SCREENING 04/11/2019  Distress experienced in past week (1-10) 8  Emotional problem type Depression;Nervousness/Anxiety;Adjusting to illness  Physical Problem type Skin dry/itchy;Tingling hands/feet;Constipation/diarrhea;Sleep/insomnia;Nausea/vomiting;Pain  Referral to support programs Yes   Clinical Social Worker follow up needed: No.  The patient stated she is doing "OK" and that her best friend and her six dogs keep her upbeat.  She reported feeling emotionally down after her diagnosis and the again after her surgery, but has since recovered and reports she is now just "a little anxious" about starting her treatment.   The patient declined counseling services at this time, but I will check-in with the patient in 3 weeks to offer emotional and mental health support.    Art Buff Grandfather Counseling Intern Voicemail:  [phone]

## 2019-04-12 ENCOUNTER — Ambulatory Visit
Admission: RE | Admit: 2019-04-12 | Discharge: 2019-04-12 | Disposition: A | Discharge status: Home / Self Care | Payer: Medicaid Other | Source: Ambulatory Visit | Attending: Radiation Oncology | Admitting: Radiation Oncology

## 2019-04-12 ENCOUNTER — Other Ambulatory Visit: Payer: Self-pay

## 2019-04-12 DIAGNOSIS — Z510 Encounter for antineoplastic radiation therapy: Secondary | ICD-10-CM | POA: Diagnosis not present

## 2019-04-12 DIAGNOSIS — C50212 Malignant neoplasm of upper-inner quadrant of left female breast: Secondary | ICD-10-CM

## 2019-04-12 MED ORDER — ALRA NON-METALLIC DEODORANT (RAD-ONC)
1.0000 "application " | Freq: Once | TOPICAL | Status: AC
  Administered 2019-04-12: 1 via TOPICAL

## 2019-04-12 MED ORDER — RADIAPLEXRX EX GEL
Freq: Once | CUTANEOUS | Status: AC
  Administered 2019-04-12: 17:00:00 via TOPICAL

## 2019-04-12 NOTE — Progress Notes (Signed)
  Radiation Oncology         [phone]) [phone] ________________________________  Name: Elizabeth Bullock MRN: [id number]  Date: 04/12/2019  DOB: 1966-01-26  Simulation Verification Note    ICD-10-CM   1. Malignant neoplasm of upper-inner quadrant of left breast in female, estrogen receptor positive ([place])  C50.212 hyaluronate sodium (RADIAPLEXRX) gel   [id number] non-metallic deodorant (ALRA) 1 application    Status: outpatient  NARRATIVE: The patient was brought to the treatment unit and placed in the planned treatment position. The clinical setup was verified. Then port films were obtained and uploaded to the radiation oncology medical record software.  The treatment beams were carefully compared against the planned radiation fields. The position location and shape of the radiation fields was reviewed. They targeted volume of tissue appears to be appropriately covered by the radiation beams. Organs at risk appear to be excluded as planned.  Based on my personal review, I approved the simulation verification. The patient's treatment will proceed as planned.  -----------------------------------  Blair Promise, PhD, MD

## 2019-04-13 ENCOUNTER — Ambulatory Visit
Admission: RE | Admit: 2019-04-13 | Discharge: 2019-04-13 | Disposition: A | Discharge status: Home / Self Care | Payer: Medicaid Other | Source: Ambulatory Visit | Attending: Radiation Oncology | Admitting: Radiation Oncology

## 2019-04-13 ENCOUNTER — Other Ambulatory Visit: Payer: Self-pay

## 2019-04-13 DIAGNOSIS — Z510 Encounter for antineoplastic radiation therapy: Secondary | ICD-10-CM | POA: Diagnosis not present

## 2019-04-14 ENCOUNTER — Other Ambulatory Visit: Payer: Self-pay

## 2019-04-14 ENCOUNTER — Ambulatory Visit
Admission: RE | Admit: 2019-04-14 | Discharge: 2019-04-14 | Disposition: A | Discharge status: Home / Self Care | Payer: Medicaid Other | Source: Ambulatory Visit | Attending: Radiation Oncology | Admitting: Radiation Oncology

## 2019-04-14 DIAGNOSIS — Z510 Encounter for antineoplastic radiation therapy: Secondary | ICD-10-CM | POA: Diagnosis not present

## 2019-04-15 ENCOUNTER — Other Ambulatory Visit: Payer: Self-pay

## 2019-04-15 ENCOUNTER — Ambulatory Visit
Admission: RE | Admit: 2019-04-15 | Discharge: 2019-04-15 | Disposition: A | Discharge status: Home / Self Care | Payer: Medicaid Other | Source: Ambulatory Visit | Attending: Radiation Oncology | Admitting: Radiation Oncology

## 2019-04-15 DIAGNOSIS — Z510 Encounter for antineoplastic radiation therapy: Secondary | ICD-10-CM | POA: Diagnosis not present

## 2019-04-18 ENCOUNTER — Ambulatory Visit
Admission: RE | Admit: 2019-04-18 | Discharge: 2019-04-18 | Disposition: A | Discharge status: Home / Self Care | Payer: Medicaid Other | Source: Ambulatory Visit | Attending: Radiation Oncology | Admitting: Radiation Oncology

## 2019-04-18 ENCOUNTER — Other Ambulatory Visit: Payer: Self-pay

## 2019-04-18 DIAGNOSIS — Z510 Encounter for antineoplastic radiation therapy: Secondary | ICD-10-CM | POA: Diagnosis not present

## 2019-04-19 ENCOUNTER — Other Ambulatory Visit: Payer: Self-pay

## 2019-04-19 ENCOUNTER — Ambulatory Visit
Admission: RE | Admit: 2019-04-19 | Discharge: 2019-04-19 | Disposition: A | Discharge status: Home / Self Care | Payer: Medicaid Other | Source: Ambulatory Visit | Attending: Radiation Oncology | Admitting: Radiation Oncology

## 2019-04-19 DIAGNOSIS — Z510 Encounter for antineoplastic radiation therapy: Secondary | ICD-10-CM | POA: Diagnosis not present

## 2019-04-20 ENCOUNTER — Ambulatory Visit
Admission: RE | Admit: 2019-04-20 | Discharge: 2019-04-20 | Disposition: A | Discharge status: Home / Self Care | Payer: Medicaid Other | Source: Ambulatory Visit | Attending: Radiation Oncology | Admitting: Radiation Oncology

## 2019-04-20 ENCOUNTER — Other Ambulatory Visit: Payer: Self-pay

## 2019-04-20 DIAGNOSIS — Z510 Encounter for antineoplastic radiation therapy: Secondary | ICD-10-CM | POA: Diagnosis not present

## 2019-04-21 ENCOUNTER — Other Ambulatory Visit: Payer: Self-pay

## 2019-04-21 ENCOUNTER — Ambulatory Visit
Admission: RE | Admit: 2019-04-21 | Discharge: 2019-04-21 | Disposition: A | Discharge status: Home / Self Care | Payer: Medicaid Other | Source: Ambulatory Visit | Attending: Radiation Oncology | Admitting: Radiation Oncology

## 2019-04-21 DIAGNOSIS — Z510 Encounter for antineoplastic radiation therapy: Secondary | ICD-10-CM | POA: Diagnosis not present

## 2019-04-22 ENCOUNTER — Ambulatory Visit
Admission: RE | Admit: 2019-04-22 | Discharge: 2019-04-22 | Disposition: A | Discharge status: Home / Self Care | Payer: Medicaid Other | Source: Ambulatory Visit | Attending: Radiation Oncology | Admitting: Radiation Oncology

## 2019-04-22 ENCOUNTER — Other Ambulatory Visit: Payer: Self-pay

## 2019-04-22 DIAGNOSIS — Z510 Encounter for antineoplastic radiation therapy: Secondary | ICD-10-CM | POA: Diagnosis not present

## 2019-04-25 ENCOUNTER — Other Ambulatory Visit: Payer: Self-pay

## 2019-04-25 ENCOUNTER — Ambulatory Visit
Admission: RE | Admit: 2019-04-25 | Discharge: 2019-04-25 | Disposition: A | Discharge status: Home / Self Care | Payer: Medicaid Other | Source: Ambulatory Visit | Attending: Radiation Oncology | Admitting: Radiation Oncology

## 2019-04-25 DIAGNOSIS — Z510 Encounter for antineoplastic radiation therapy: Secondary | ICD-10-CM | POA: Diagnosis not present

## 2019-04-26 ENCOUNTER — Other Ambulatory Visit: Payer: Self-pay

## 2019-04-26 ENCOUNTER — Ambulatory Visit
Admission: RE | Admit: 2019-04-26 | Discharge: 2019-04-26 | Disposition: A | Discharge status: Home / Self Care | Payer: Medicaid Other | Source: Ambulatory Visit | Attending: Radiation Oncology | Admitting: Radiation Oncology

## 2019-04-26 DIAGNOSIS — Z510 Encounter for antineoplastic radiation therapy: Secondary | ICD-10-CM | POA: Diagnosis not present

## 2019-04-27 ENCOUNTER — Other Ambulatory Visit: Payer: Self-pay

## 2019-04-27 ENCOUNTER — Ambulatory Visit
Admission: RE | Admit: 2019-04-27 | Discharge: 2019-04-27 | Disposition: A | Discharge status: Home / Self Care | Payer: Medicaid Other | Source: Ambulatory Visit | Attending: Radiation Oncology | Admitting: Radiation Oncology

## 2019-04-27 DIAGNOSIS — Z510 Encounter for antineoplastic radiation therapy: Secondary | ICD-10-CM | POA: Diagnosis not present

## 2019-04-28 ENCOUNTER — Ambulatory Visit
Admission: RE | Admit: 2019-04-28 | Discharge: 2019-04-28 | Disposition: A | Discharge status: Home / Self Care | Payer: Medicaid Other | Source: Ambulatory Visit | Attending: Radiation Oncology | Admitting: Radiation Oncology

## 2019-04-28 ENCOUNTER — Other Ambulatory Visit: Payer: Self-pay

## 2019-04-28 DIAGNOSIS — Z510 Encounter for antineoplastic radiation therapy: Secondary | ICD-10-CM | POA: Diagnosis not present

## 2019-04-29 ENCOUNTER — Other Ambulatory Visit: Payer: Self-pay

## 2019-04-29 ENCOUNTER — Ambulatory Visit
Admission: RE | Admit: 2019-04-29 | Discharge: 2019-04-29 | Disposition: A | Discharge status: Home / Self Care | Payer: Medicaid Other | Source: Ambulatory Visit | Attending: Radiation Oncology | Admitting: Radiation Oncology

## 2019-04-29 DIAGNOSIS — Z510 Encounter for antineoplastic radiation therapy: Secondary | ICD-10-CM | POA: Diagnosis not present

## 2019-05-02 ENCOUNTER — Other Ambulatory Visit: Payer: Self-pay

## 2019-05-02 ENCOUNTER — Ambulatory Visit
Admission: RE | Admit: 2019-05-02 | Discharge: 2019-05-02 | Disposition: A | Discharge status: Home / Self Care | Payer: Medicaid Other | Source: Ambulatory Visit | Attending: Radiation Oncology | Admitting: Radiation Oncology

## 2019-05-02 DIAGNOSIS — C50212 Malignant neoplasm of upper-inner quadrant of left female breast: Secondary | ICD-10-CM | POA: Insufficient documentation

## 2019-05-02 DIAGNOSIS — Z510 Encounter for antineoplastic radiation therapy: Secondary | ICD-10-CM | POA: Diagnosis present

## 2019-05-03 ENCOUNTER — Ambulatory Visit: Payer: Medicaid Other | Admitting: Physician Assistant

## 2019-05-03 ENCOUNTER — Ambulatory Visit
Admission: RE | Admit: 2019-05-03 | Discharge: 2019-05-03 | Disposition: A | Discharge status: Home / Self Care | Payer: Medicaid Other | Source: Ambulatory Visit | Attending: Radiation Oncology | Admitting: Radiation Oncology

## 2019-05-03 ENCOUNTER — Other Ambulatory Visit: Payer: Self-pay

## 2019-05-03 DIAGNOSIS — Z510 Encounter for antineoplastic radiation therapy: Secondary | ICD-10-CM | POA: Diagnosis not present

## 2019-05-04 ENCOUNTER — Ambulatory Visit
Admission: RE | Admit: 2019-05-04 | Discharge: 2019-05-04 | Disposition: A | Discharge status: Home / Self Care | Payer: Medicaid Other | Source: Ambulatory Visit | Attending: Radiation Oncology | Admitting: Radiation Oncology

## 2019-05-04 ENCOUNTER — Other Ambulatory Visit: Payer: Self-pay

## 2019-05-04 DIAGNOSIS — Z510 Encounter for antineoplastic radiation therapy: Secondary | ICD-10-CM | POA: Diagnosis not present

## 2019-05-05 ENCOUNTER — Ambulatory Visit
Admission: RE | Admit: 2019-05-05 | Discharge: 2019-05-05 | Disposition: A | Discharge status: Home / Self Care | Payer: Medicaid Other | Source: Ambulatory Visit | Attending: Radiation Oncology | Admitting: Radiation Oncology

## 2019-05-05 ENCOUNTER — Other Ambulatory Visit: Payer: Self-pay

## 2019-05-05 DIAGNOSIS — Z510 Encounter for antineoplastic radiation therapy: Secondary | ICD-10-CM | POA: Diagnosis not present

## 2019-05-06 ENCOUNTER — Other Ambulatory Visit: Payer: Self-pay

## 2019-05-06 ENCOUNTER — Ambulatory Visit
Admission: RE | Admit: 2019-05-06 | Discharge: 2019-05-06 | Disposition: A | Discharge status: Home / Self Care | Payer: Medicaid Other | Source: Ambulatory Visit | Attending: Radiation Oncology | Admitting: Radiation Oncology

## 2019-05-06 DIAGNOSIS — Z510 Encounter for antineoplastic radiation therapy: Secondary | ICD-10-CM | POA: Diagnosis not present

## 2019-05-09 ENCOUNTER — Other Ambulatory Visit: Payer: Self-pay

## 2019-05-09 ENCOUNTER — Ambulatory Visit
Admission: RE | Admit: 2019-05-09 | Discharge: 2019-05-09 | Disposition: A | Discharge status: Home / Self Care | Payer: Medicaid Other | Source: Ambulatory Visit | Attending: Radiation Oncology | Admitting: Radiation Oncology

## 2019-05-09 DIAGNOSIS — Z510 Encounter for antineoplastic radiation therapy: Secondary | ICD-10-CM | POA: Diagnosis not present

## 2019-05-09 NOTE — Progress Notes (Addendum)
Patient Care Team: Patient, No Pcp Per as PCP - General (General Practice) [name], RN as Oncology Nurse Navigator [name], RN as Oncology Nurse Navigator [name], MD as Consulting Physician (General Surgery) [name], MD as Consulting Physician (Hematology and Oncology) [name], MD as Consulting Physician (Radiation Oncology)  DIAGNOSIS:    ICD-10-CM   1. Malignant neoplasm of upper-inner quadrant of left breast in female, estrogen receptor positive ([place])  C50.212    Z17.0     SUMMARY OF ONCOLOGIC HISTORY: Oncology History  Malignant neoplasm of upper-inner quadrant of left breast in female, estrogen receptor positive ([place])  02/11/2019 Initial Diagnosis   Routine screening mammogram detected a 2m mass with associated architectural distortion in the upper left breast, no axillary adenopathy. 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   02/16/2019 Cancer Staging   Staging form: Breast, AJCC 8th Edition - Clinical stage from 02/16/2019: Stage IA (cT1a, cN0, cM0, G2, ER+, PR+, HER2-) - Signed by GNicholas Lose MD on 02/16/2019   03/15/2019 Surgery   Left lumpectomy ([name]: IDC with DCIS, 0.3cm, grade 1, clear margins, and 4 left axillary lymph nodes negative.   04/13/2019 -  Radiation Therapy   Adjuvant radiation treatment     CHIEF COMPLIANT: Follow-up to discuss anti-estrogen therapy  INTERVAL HISTORY: Elizabeth Bullock a 53y.o. with above-mentioned history of left breast cancer who underwent a lumpectomy and is currently undergoing radiation therapy. She presents to the clinic today to discuss anti-estrogen therapy with anastrozole.  She has some radiation dermatitis and also fatigue from radiation today the last day of radiation so she has been excited about that.  REVIEW OF SYSTEMS:   Constitutional: Denies fevers, chills or abnormal weight loss Eyes: Denies blurriness of  vision Ears, nose, mouth, throat, and face: Denies mucositis or sore throat Respiratory: Denies cough, dyspnea or wheezes Cardiovascular: Denies palpitation, chest discomfort Gastrointestinal: Denies nausea, heartburn or change in bowel habits Skin: Denies abnormal skin rashes Lymphatics: Denies new lymphadenopathy or easy bruising Neurological: Denies numbness, tingling or new weaknesses Behavioral/Psych: Mood is stable, no new changes  Extremities: No lower extremity edema Breast: Radiation dermatitis All other systems were reviewed with the patient and are negative.  I have reviewed the past medical history, past surgical history, social history and family history with the patient and they are unchanged from previous note.  ALLERGIES:  is allergic to codeine; morphine and related; and penicillins.  MEDICATIONS:  Current Outpatient Medications  Medication Sig Dispense Refill  . acetaminophen (TYLENOL) 500 MG tablet Take 1,000 mg by mouth 2 (two) times daily as needed for moderate pain or headache.    . APPLE CIDER VINEGAR PO Take 1 tablet by mouth daily.    . Aspirin-Caffeine (BC FAST PAIN RELIEF PO) Take 1 packet by mouth daily as needed (headache).    .Marland Kitchenatorvastatin (LIPITOR) 20 MG tablet Take 1 tablet (20 mg total) by mouth daily. 90 tablet 1  . loratadine (CLARITIN) 10 MG tablet Take 10 mg by mouth daily as needed for allergies.     . Multiple Vitamin (MULTIVITAMIN WITH MINERALS) TABS tablet Take 1 tablet by mouth daily.    .Marland Kitchenomeprazole (PRILOSEC) 20 MG capsule Take 1 capsule (20 mg total) by mouth daily. 30 capsule 0  . venlafaxine XR (EFFEXOR XR) 37.5 MG 24 hr capsule Take 1 capsule (37.5 mg total) by mouth daily with breakfast. 30 capsule  6   No current facility-administered medications for this visit.     PHYSICAL EXAMINATION: ECOG PERFORMANCE STATUS: 1 - Symptomatic but completely ambulatory  Vitals:   05/10/19 1138  BP: 130/81  Pulse: 80  Resp: 17  Temp: 98 F  (36.7 C)  SpO2: 99%   Filed Weights   05/10/19 1138  Weight: 161 lb 14.4 oz (73.4 kg)    GENERAL: alert, no distress and comfortable SKIN: skin color, texture, turgor are normal, no rashes or significant lesions EYES: normal, Conjunctiva are pink and non-injected, sclera clear OROPHARYNX: no exudate, no erythema and lips, buccal mucosa, and tongue normal  NECK: supple, thyroid normal size, non-tender, without nodularity LYMPH: no palpable lymphadenopathy in the cervical, axillary or inguinal LUNGS: clear to auscultation and percussion with normal breathing effort HEART: regular rate & rhythm and no murmurs and no lower extremity edema ABDOMEN: abdomen soft, non-tender and normal bowel sounds MUSCULOSKELETAL: no cyanosis of digits and no clubbing  NEURO: alert & oriented x 3 with fluent speech, no focal motor/sensory deficits EXTREMITIES: No lower extremity edema  LABORATORY DATA:  I have reviewed the data as listed CMP Latest Ref Rng & Units 03/09/2019 02/16/2019 12/23/2018  Glucose 70 - 99 mg/dL 81 94 100(H)  BUN 6 - 20 mg/dL '6 6 10  ' Creatinine 0.44 - 1.00 mg/dL 0.86 0.85 0.80  Sodium 135 - 145 mmol/L 139 140 139  Potassium 3.5 - 5.1 mmol/L 3.8 3.5 4.2  Chloride 98 - 111 mmol/L 104 103 102  CO2 22 - 32 mmol/L '25 26 23  ' Calcium 8.9 - 10.3 mg/dL 9.4 9.1 9.4  Total Protein 6.5 - 8.1 g/dL 7.0 6.6 7.1  Total Bilirubin 0.3 - 1.2 mg/dL 0.7 0.7 0.7  Alkaline Phos 38 - 126 U/L 96 91 86  AST 15 - 41 U/L 20 14(L) 16  ALT 0 - 44 U/L '19 15 17    ' Lab Results  Component Value Date   WBC 9.7 03/09/2019   HGB 15.7 (H) 03/09/2019   HCT 47.2 (H) 03/09/2019   MCV 89.4 03/09/2019   PLT 260 03/09/2019   NEUTROABS 6.8 02/16/2019    ASSESSMENT & PLAN:  Malignant neoplasm of upper-inner quadrant of left breast in female, estrogen receptor positive ([place]) 03/15/2019:Left lumpectomy [name]): IDC with DCIS, 0.3cm, grade 1, clear margins, and 4 left axillary lymph nodes negative.  ER 100%, PR 90%,  Ki-67 10%, HER-2 negative, T1 a N0 stage Ia Adjuvant radiation 04/13/2019-05/10/2019  Treatment plan: Antiestrogen therapy with anastrozole 1 mg daily (FSH 64.7: Menopausal)  Anastrozole counseling: We discussed the risks and benefits of anti-estrogen therapy with aromatase inhibitors. These include but not limited to insomnia, hot flashes, mood changes, vaginal dryness, bone density loss, and weight gain. We strongly believe that the benefits far outweigh the risks. Patient understands these risks and consented to starting treatment. Planned treatment duration is 7 years.  Severe hot flashes and anxiety: On Effexor marked improvement.  Patient agreed to participate in the antiestrogen therapy compliance study being conducted by our nurse practitioner Ria Comment. She signed a consent form and Mendel Ryder set her up for these questionnaires. She will start anastrozole after Thanksgiving.  Return to clinic in 3 months for survivorship care plan visit.   No orders of the defined types were placed in this encounter.  The patient has a good understanding of the overall plan. she agrees with it. she will call with any problems that may develop before the next visit here.  [name], MD  05/10/2019  I, Cloyde Reams Dorshimer am acting as scribe for Dr. [name].  I have reviewed the above documentation for accuracy and completeness, and I agree with the above.

## 2019-05-10 ENCOUNTER — Inpatient Hospital Stay: Payer: Medicaid Other | Attending: Hematology and Oncology | Admitting: Hematology and Oncology

## 2019-05-10 ENCOUNTER — Encounter (INDEPENDENT_AMBULATORY_CARE_PROVIDER_SITE_OTHER): Payer: Self-pay

## 2019-05-10 ENCOUNTER — Encounter: Payer: Self-pay | Admitting: *Deleted

## 2019-05-10 ENCOUNTER — Other Ambulatory Visit: Payer: Self-pay

## 2019-05-10 ENCOUNTER — Other Ambulatory Visit: Payer: Self-pay | Admitting: Adult Health

## 2019-05-10 ENCOUNTER — Encounter: Payer: Self-pay | Admitting: Radiation Oncology

## 2019-05-10 ENCOUNTER — Ambulatory Visit
Admission: RE | Admit: 2019-05-10 | Discharge: 2019-05-10 | Disposition: A | Discharge status: Home / Self Care | Payer: Medicaid Other | Source: Ambulatory Visit | Attending: Radiation Oncology | Admitting: Radiation Oncology

## 2019-05-10 DIAGNOSIS — Z170 Estrogen receptor positive status [ER+]: Secondary | ICD-10-CM

## 2019-05-10 DIAGNOSIS — Z510 Encounter for antineoplastic radiation therapy: Secondary | ICD-10-CM | POA: Diagnosis not present

## 2019-05-10 DIAGNOSIS — C50212 Malignant neoplasm of upper-inner quadrant of left female breast: Secondary | ICD-10-CM | POA: Diagnosis not present

## 2019-05-10 MED ORDER — ANASTROZOLE 1 MG PO TABS: 1.0000 mg | ORAL_TABLET | Freq: Every day | ORAL | 3 refills | Status: DC

## 2019-05-10 NOTE — Assessment & Plan Note (Signed)
03/15/2019:Left lumpectomy [name]): IDC with DCIS, 0.3cm, grade 1, clear margins, and 4 left axillary lymph nodes negative.  ER 100%, PR 90%, Ki-67 10%, HER-2 negative, T1 a N0 stage Ia Adjuvant radiation 04/13/2019-05/10/2019  Treatment plan: Antiestrogen therapy with anastrozole 1 mg daily (FSH 64.7: Menopausal)  Anastrozole counseling: We discussed the risks and benefits of anti-estrogen therapy with aromatase inhibitors. These include but not limited to insomnia, hot flashes, mood changes, vaginal dryness, bone density loss, and weight gain. We strongly believe that the benefits far outweigh the risks. Patient understands these risks and consented to starting treatment. Planned treatment duration is 7 years.  Severe hot flashes and anxiety: On Effexor  Return to clinic in 3 months for survivorship care plan visit

## 2019-05-10 NOTE — Addendum Note (Signed)
Addended by: [name] on: 05/10/2019 11:49 AM   Modules accepted: Orders

## 2019-05-11 ENCOUNTER — Telehealth: Payer: Self-pay | Admitting: Hematology and Oncology

## 2019-05-11 NOTE — Telephone Encounter (Signed)
I left a message regarding schedule  

## 2019-05-17 ENCOUNTER — Other Ambulatory Visit: Payer: Self-pay

## 2019-05-17 ENCOUNTER — Encounter: Payer: Self-pay | Admitting: Radiation Oncology

## 2019-05-17 ENCOUNTER — Ambulatory Visit
Admission: RE | Admit: 2019-05-17 | Discharge: 2019-05-17 | Disposition: A | Discharge status: Home / Self Care | Payer: Medicaid Other | Source: Ambulatory Visit | Attending: Radiation Oncology | Admitting: Radiation Oncology

## 2019-05-17 VITALS — BP 116/79 | HR 85 | Temp 97.9°F | Resp 20 | Ht 63.0 in

## 2019-05-17 DIAGNOSIS — C50212 Malignant neoplasm of upper-inner quadrant of left female breast: Secondary | ICD-10-CM | POA: Diagnosis not present

## 2019-05-17 DIAGNOSIS — Z923 Personal history of irradiation: Secondary | ICD-10-CM | POA: Diagnosis not present

## 2019-05-17 DIAGNOSIS — Z79899 Other long term (current) drug therapy: Secondary | ICD-10-CM | POA: Insufficient documentation

## 2019-05-17 DIAGNOSIS — Z170 Estrogen receptor positive status [ER+]: Secondary | ICD-10-CM | POA: Diagnosis not present

## 2019-05-17 NOTE — Progress Notes (Signed)
Radiation Oncology         [phone]) [phone] ________________________________  Name: Elizabeth Bullock MRN: [id number]  Date: 05/17/2019  DOB: [birth year]  Follow-Up Visit Note  CC: Patient, No Pcp Per  [name], MD    ICD-10-CM   1. Malignant neoplasm of upper-inner quadrant of left breast in female, estrogen receptor positive ([place])  C50.212    Z17.0     Diagnosis:   StageIA(pT1a, pN0)LeftBreast UIQ,Invasive DuctalCarcinoma with DCIS, ER+/ PR+/ Her2-, Grade1    Narrative:  The patient returns today for unscheduled follow-up.  Patient recently completed her radiation therapy directed at the left breast as part of breast conserving therapy.  Pt discovered "lump" in breast last night. Lump is under the skin in axilla/lateral breast and is approximately 1 cm in size. Pt reports lump is painful. Skin is red from radiation treatments to that area. Pt reports nipple is peeling and tender to touch. Pt reports nipple is also itching and is using hydrocortisone cream with good results.                           ALLERGIES:  is allergic to codeine; morphine and related; and penicillins.  Meds: Current Outpatient Medications  Medication Sig Dispense Refill  . acetaminophen (TYLENOL) 500 MG tablet Take 1,000 mg by mouth 2 (two) times daily as needed for moderate pain or headache.    . anastrozole (ARIMIDEX) 1 MG tablet Take 1 tablet (1 mg total) by mouth daily. 90 tablet 3  . APPLE CIDER VINEGAR PO Take 1 tablet by mouth daily.    . Aspirin-Caffeine (BC FAST PAIN RELIEF PO) Take 1 packet by mouth daily as needed (headache).    Marland Kitchen atorvastatin (LIPITOR) 20 MG tablet Take 1 tablet (20 mg total) by mouth daily. 90 tablet 1  . loratadine (CLARITIN) 10 MG tablet Take 10 mg by mouth daily as needed for allergies.     . Multiple Vitamin (MULTIVITAMIN WITH MINERALS) TABS tablet Take 1 tablet by mouth daily.    Marland Kitchen omeprazole (PRILOSEC) 20 MG capsule Take 1 capsule (20 mg total) by mouth daily.  30 capsule 0  . venlafaxine XR (EFFEXOR XR) 37.5 MG 24 hr capsule Take 1 capsule (37.5 mg total) by mouth daily with breakfast. 30 capsule 6   No current facility-administered medications for this encounter.     Physical Findings: The patient is in no acute distress. Patient is alert and oriented.  height is '5\' 3"'$  (1.6 m). Her temporal temperature is 97.9 F (36.6 C). Her blood pressure is 116/79 and her pulse is 85. Her respiration is 20 and oxygen saturation is 98%. .  The lungs are clear.  The heart has a regular rhythm and rate.  Examination of the left breast reveals erythema and hyperpigmentation changes consistent with radiation effect.  No signs of infection within the breast.  Patient has palpable approximately 1 cm and appears to be cyst in the lateral aspect of the breast mildly painful with palpation.  Lab Findings: Lab Results  Component Value Date   WBC 9.7 03/09/2019   HGB 15.7 (H) 03/09/2019   HCT 47.2 (H) 03/09/2019   MCV 89.4 03/09/2019   PLT 260 03/09/2019    Radiographic Findings: No results found.  Impression:  The patient is recovering from the effects of radiation.  Discussed with the patient that this area she is feeling is consistent with a cyst within the breast.  She will be  seen in approximately 3 weeks for her routine schedule I month follow-up appointment after completing her radiation therapy  Plan: Routine follow-up as scheduled.  Patient will call if this area enlarges or becomes more painful  ____________________________________ [name], MD

## 2019-05-17 NOTE — Patient Instructions (Signed)
Coronavirus (COVID-19) Are you at risk?  Are you at risk for the Coronavirus (COVID-19)?  To be considered HIGH RISK for Coronavirus (COVID-19), you have to meet the following criteria:  . Traveled to [place], [place], [place], [place] or [place]; or in the [place] to [place], [place], [place], or [place]; and have fever, cough, and shortness of breath within the last 2 weeks of travel OR . Been in close contact with a person diagnosed with COVID-19 within the last 2 weeks and have fever, cough, and shortness of breath . IF YOU DO NOT MEET THESE CRITERIA, YOU ARE CONSIDERED LOW RISK FOR COVID-19.  What to do if you are HIGH RISK for COVID-19?  . If you are having a medical emergency, call 911. . Seek medical care right away. Before you go to a doctor's office, urgent care or emergency department, call ahead and tell them about your recent travel, contact with someone diagnosed with COVID-19, and your symptoms. You should receive instructions from your physician's office regarding next steps of care.  . When you arrive at healthcare provider, tell the healthcare staff immediately you have returned from visiting [place], [place], [place], [place] or [place]; or traveled in the [place] to [place], [place], [place], or [place]; in the last two weeks or you have been in close contact with a person diagnosed with COVID-19 in the last 2 weeks.   . Tell the health care staff about your symptoms: fever, cough and shortness of breath. . After you have been seen by a medical provider, you will be either: o Tested for (COVID-19) and discharged home on quarantine except to seek medical care if symptoms worsen, and asked to  - Stay home and avoid contact with others until you get your results (4-5 days)  - Avoid travel on public transportation if possible (such as bus, train, or airplane) or o Sent to the Emergency Department by EMS for evaluation, COVID-19 testing, and possible  admission depending on your condition and test results.  What to do if you are LOW RISK for COVID-19?  Reduce your risk of any infection by using the same precautions used for avoiding the common cold or flu:  . Wash your hands often with soap and warm water for at least 20 seconds.  If soap and water are not readily available, use an alcohol-based hand sanitizer with at least 60% alcohol.  . If coughing or sneezing, cover your mouth and nose by coughing or sneezing into the elbow areas of your shirt or coat, into a tissue or into your sleeve (not your hands). . Avoid shaking hands with others and consider head nods or verbal greetings only. . Avoid touching your eyes, nose, or mouth with unwashed hands.  . Avoid close contact with people who are sick. . Avoid places or events with large numbers of people in one location, like concerts or sporting events. . Carefully consider travel plans you have or are making. . If you are planning any travel outside or inside the [place], visit the CDC's Travelers' Health webpage for the latest health notices. . If you have some symptoms but not all symptoms, continue to monitor at home and seek medical attention if your symptoms worsen. . If you are having a medical emergency, call 911.   ADDITIONAL HEALTHCARE OPTIONS FOR PATIENTS  [facility] / e-Visit: [email or web address]         [facility]: [phone]  [facility]: [phone]                   [facility]: [phone]   

## 2019-05-17 NOTE — Progress Notes (Signed)
Pt presents today for work in appt. Pt discovered "lump" in breast last night. Lump is under the skin in axilla/lateral breast and is approximately 1 cm in size. Pt reports lump is painful. Skin is red from radiation treatments to that area. Pt reports nipple is peeling and tender to touch. Pt reports nipple is also itching and is using hydrocortisone cream with good results.   BP 116/79 (BP Location: Right Arm, Patient Position: Sitting)   Pulse 85   Temp 97.9 F (36.6 C) (Temporal)   Resp 20   Ht 5\' 3"  (1.6 m)   SpO2 98%   BMI 28.68 kg/m   Wt Readings from Last 3 Encounters:  05/10/19 161 lb 14.4 oz (73.4 kg)  04/04/19 162 lb (73.5 kg)  03/22/19 161 lb 3.2 oz (73.1 kg)   Loma Sousa, RN BSN

## 2019-05-18 NOTE — Progress Notes (Incomplete)
Patient Name: Elizabeth Bullock MRN: [id number] DOB: [birth year] Referring Physician: [name] (Profile Not Attached) Date of Service: 05/10/2019 [facility] [place]                                                        End Of Treatment Note  Diagnoses: C50.212-Malignant neoplasm of upper-inner quadrant of left female breast  Cancer Staging: StageIA(pT1a, pN0)LeftBreast UIQ,Invasive DuctalCarcinoma with DCIS, ER+/ PR+/ Her2-, Grade1  Intent: Curative  Radiation Treatment Dates: 04/12/2019 through 05/10/2019 Site Technique Total Dose (Gy) Dose per Fx (Gy) Completed Fx Beam Energies  Breast: Breast_Lt 3D 40.05/40.05 2.67 15/15 6X  Breast: Breast_Lt_Bst 3D 12/12 2 6/6 6X, 10X   Narrative: The patient tolerated radiation therapy relatively well. In the beginning of treatment, the patient reported dizziness when standing with associated nausea, which resolved. Patient reported left breast pain with some mild associated pruritis and moderate fatigue throughout treatment. There was consistently some diffuse erythema and hyperpigmentation changes of left breast without skin breakdown.   Plan: The patient will follow-up with radiation oncology in one month.  ________________________________________________    Blair Promise, PhD, MD  This document serves as a record of services personally performed by [name], MD. It was created on his behalf by Clerance Lav, a trained medical scribe. The creation of this record is based on the scribe's personal observations and the provider's statements to them. This document has been checked and approved by the attending provider.

## 2019-06-06 ENCOUNTER — Encounter (INDEPENDENT_AMBULATORY_CARE_PROVIDER_SITE_OTHER): Payer: Self-pay

## 2019-06-07 ENCOUNTER — Encounter: Payer: Self-pay | Admitting: *Deleted

## 2019-06-09 ENCOUNTER — Encounter (HOSPITAL_COMMUNITY): Payer: Self-pay

## 2019-06-13 ENCOUNTER — Encounter (INDEPENDENT_AMBULATORY_CARE_PROVIDER_SITE_OTHER): Payer: Self-pay

## 2019-06-13 ENCOUNTER — Ambulatory Visit
Admission: RE | Admit: 2019-06-13 | Discharge: 2019-06-13 | Disposition: A | Discharge status: Home / Self Care | Payer: Medicaid Other | Source: Ambulatory Visit | Attending: Radiation Oncology | Admitting: Radiation Oncology

## 2019-06-13 ENCOUNTER — Other Ambulatory Visit: Payer: Self-pay

## 2019-06-13 ENCOUNTER — Encounter: Payer: Self-pay | Admitting: Radiation Oncology

## 2019-06-13 VITALS — BP 127/81 | HR 67 | Temp 98.0°F | Resp 18 | Ht 63.0 in | Wt 158.5 lb

## 2019-06-13 DIAGNOSIS — Z170 Estrogen receptor positive status [ER+]: Secondary | ICD-10-CM | POA: Diagnosis not present

## 2019-06-13 DIAGNOSIS — Z79899 Other long term (current) drug therapy: Secondary | ICD-10-CM | POA: Diagnosis not present

## 2019-06-13 DIAGNOSIS — M2550 Pain in unspecified joint: Secondary | ICD-10-CM | POA: Diagnosis not present

## 2019-06-13 DIAGNOSIS — Z79811 Long term (current) use of aromatase inhibitors: Secondary | ICD-10-CM | POA: Diagnosis not present

## 2019-06-13 DIAGNOSIS — Z923 Personal history of irradiation: Secondary | ICD-10-CM | POA: Diagnosis not present

## 2019-06-13 DIAGNOSIS — C50212 Malignant neoplasm of upper-inner quadrant of left female breast: Secondary | ICD-10-CM | POA: Diagnosis present

## 2019-06-13 NOTE — Progress Notes (Signed)
Pt presents today for f/u with Dr. Sondra Come. Pt reports lingering fatigue that is improving. Pt reports generalized joint and muscle for past 5-6 days. Pt reports starting anastrozole on Thanksgiving Day. Pt rates pain 6/10. Pt denies pain in breast. Pt is using lotion as moisturizer for breast. Breast with faint hyperpigmentation.   BP 127/81 (BP Location: Right Arm, Patient Position: Sitting)   Pulse 67   Temp 98 F (36.7 C) (Temporal)   Resp 18   Ht 5\' 3"  (1.6 m)   Wt 158 lb 8 oz (71.9 kg)   SpO2 100%   BMI 28.08 kg/m \  Wt Readings from Last 3 Encounters:  06/13/19 158 lb 8 oz (71.9 kg)  05/10/19 161 lb 14.4 oz (73.4 kg)  04/04/19 162 lb (73.5 kg)   Loma Sousa, RN BSN

## 2019-06-13 NOTE — Progress Notes (Signed)
Radiation Oncology         [phone]) [phone] ________________________________  Name: Elizabeth Bullock MRN: [id number]  Date: 06/13/2019  DOB: [birth year]  Follow-Up Visit Note  CC: Patient, No Pcp Per  [name], MD    ICD-10-CM   1. Malignant neoplasm of upper-inner quadrant of left breast in female, estrogen receptor positive ([place])  C50.212    Z17.0     Diagnosis: StageIA(pT1a, pN0)LeftBreast UIQ,Invasive DuctalCarcinoma with DCIS, ER+/ PR+/ Her2-, Grade1  Interval Since Last Radiation: One month and four days.  Radiation Treatment Dates: 04/12/2019 through 05/10/2019 Site Technique Total Dose (Gy) Dose per Fx (Gy) Completed Fx Beam Energies  Breast: Breast_Lt 3D 40.05/40.05 2.67 15/15 6X  Breast: Breast_Lt_Bst 3D 12/12 2 6/6 6X, 10X    Narrative:  The patient returns today for routine follow-up. She is doing well overall.   On review of systems, patient reports generalized muscle and joint pain since initiating anastrozole. Patient denies pain within the left breast nipple discharge or bleeding.                            ALLERGIES:  is allergic to codeine; morphine and related; and penicillins.  Meds: Current Outpatient Medications  Medication Sig Dispense Refill  . acetaminophen (TYLENOL) 500 MG tablet Take 1,000 mg by mouth 2 (two) times daily as needed for moderate pain or headache.    . anastrozole (ARIMIDEX) 1 MG tablet Take 1 tablet (1 mg total) by mouth daily. 90 tablet 3  . APPLE CIDER VINEGAR PO Take 1 tablet by mouth daily.    . Aspirin-Caffeine (BC FAST PAIN RELIEF PO) Take 1 packet by mouth daily as needed (headache).    Marland Kitchen atorvastatin (LIPITOR) 20 MG tablet Take 1 tablet (20 mg total) by mouth daily. 90 tablet 1  . loratadine (CLARITIN) 10 MG tablet Take 10 mg by mouth daily as needed for allergies.     . Multiple Vitamin (MULTIVITAMIN WITH MINERALS) TABS tablet Take 1 tablet by mouth daily.    Marland Kitchen omeprazole (PRILOSEC) 20 MG capsule Take 1 capsule  (20 mg total) by mouth daily. 30 capsule 0  . venlafaxine XR (EFFEXOR XR) 37.5 MG 24 hr capsule Take 1 capsule (37.5 mg total) by mouth daily with breakfast. 30 capsule 6   No current facility-administered medications for this encounter.    Physical Findings: The patient is in no acute distress. Patient is alert and oriented.  height is '5\' 3"'  (1.6 m) and weight is 158 lb 8 oz (71.9 kg). Her temporal temperature is 98 F (36.7 C). Her blood pressure is 127/81 and her pulse is 67. Her respiration is 18 and oxygen saturation is 100%. .  No significant changes.  Lungs are clear to auscultation bilaterally. Heart has regular rate and rhythm. No palpable cervical, supraclavicular, or axillary adenopathy. Abdomen soft, non-tender, normal bowel sounds. Right breast: no palpable mass, nipple discharge or bleeding. Left breast: Hyperpigmentation changes noted.  Mild edema in the nipple areolar complex area.  Skin is well-healed.  No dominant masses appreciated in the breast nipple discharge or bleeding  Lab Findings: Lab Results  Component Value Date   WBC 9.7 03/09/2019   HGB 15.7 (H) 03/09/2019   HCT 47.2 (H) 03/09/2019   MCV 89.4 03/09/2019   PLT 260 03/09/2019    Radiographic Findings: No results found.  Impression: The patient is recovering from the effects of radiation.  No evidence of recurrence on clinical  exam today  Plan: As needed follow-up in radiation oncology.  Patient will continue to follow-up closely with medical oncology and remain on adjuvant hormonal therapy as above.  ____________________________________   Blair Promise, PhD, MD  This document serves as a record of services personally performed by [name], MD. It was created on his behalf by Clerance Lav, a trained medical scribe. The creation of this record is based on the scribe's personal observations and the provider's statements to them. This document has been checked and approved by the attending provider.

## 2019-06-13 NOTE — Patient Instructions (Signed)
Coronavirus (COVID-19) Are you at risk?  Are you at risk for the Coronavirus (COVID-19)?  To be considered HIGH RISK for Coronavirus (COVID-19), you have to meet the following criteria:  . Traveled to [place], [place], [place], [place] or [place]; or in the [place] to [place], [place], [place], or [place]; and have fever, cough, and shortness of breath within the last 2 weeks of travel OR . Been in close contact with a person diagnosed with COVID-19 within the last 2 weeks and have fever, cough, and shortness of breath . IF YOU DO NOT MEET THESE CRITERIA, YOU ARE CONSIDERED LOW RISK FOR COVID-19.  What to do if you are HIGH RISK for COVID-19?  . If you are having a medical emergency, call 911. . Seek medical care right away. Before you go to a doctor's office, urgent care or emergency department, call ahead and tell them about your recent travel, contact with someone diagnosed with COVID-19, and your symptoms. You should receive instructions from your physician's office regarding next steps of care.  . When you arrive at healthcare provider, tell the healthcare staff immediately you have returned from visiting [place], [place], [place], [place] or [place]; or traveled in the [place] to [place], [place], [place], or [place]; in the last two weeks or you have been in close contact with a person diagnosed with COVID-19 in the last 2 weeks.   . Tell the health care staff about your symptoms: fever, cough and shortness of breath. . After you have been seen by a medical provider, you will be either: o Tested for (COVID-19) and discharged home on quarantine except to seek medical care if symptoms worsen, and asked to  - Stay home and avoid contact with others until you get your results (4-5 days)  - Avoid travel on public transportation if possible (such as bus, train, or airplane) or o Sent to the Emergency Department by EMS for evaluation, COVID-19 testing, and possible  admission depending on your condition and test results.  What to do if you are LOW RISK for COVID-19?  Reduce your risk of any infection by using the same precautions used for avoiding the common cold or flu:  . Wash your hands often with soap and warm water for at least 20 seconds.  If soap and water are not readily available, use an alcohol-based hand sanitizer with at least 60% alcohol.  . If coughing or sneezing, cover your mouth and nose by coughing or sneezing into the elbow areas of your shirt or coat, into a tissue or into your sleeve (not your hands). . Avoid shaking hands with others and consider head nods or verbal greetings only. . Avoid touching your eyes, nose, or mouth with unwashed hands.  . Avoid close contact with people who are sick. . Avoid places or events with large numbers of people in one location, like concerts or sporting events. . Carefully consider travel plans you have or are making. . If you are planning any travel outside or inside the [place], visit the CDC's Travelers' Health webpage for the latest health notices. . If you have some symptoms but not all symptoms, continue to monitor at home and seek medical attention if your symptoms worsen. . If you are having a medical emergency, call 911.   ADDITIONAL HEALTHCARE OPTIONS FOR PATIENTS   Telehealth / e-Visit: [email or web address]         [facility]: [phone]  [facility]: [phone]                   [facility]: [phone]   

## 2019-06-14 ENCOUNTER — Telehealth: Payer: Self-pay | Admitting: *Deleted

## 2019-06-14 MED ORDER — VENLAFAXINE HCL ER 75 MG PO CP24: 75.0000 mg | ORAL_CAPSULE | Freq: Every day | ORAL | 6 refills | Status: DC

## 2019-06-14 NOTE — Telephone Encounter (Signed)
Received order to increase Effexor to 75mg . Called pt and informed of change in dosage. Verified pharmacy.

## 2019-06-14 NOTE — Telephone Encounter (Signed)
Called pt to access needs and tolerance to AI. Pt relate she is doing well except for hot flashes she is having since taking AI. Pt currently taking Effexor 37.5mg . Mendel Ryder, NP notified of pt increased hot flashes.

## 2019-06-20 ENCOUNTER — Encounter (INDEPENDENT_AMBULATORY_CARE_PROVIDER_SITE_OTHER): Payer: Self-pay

## 2019-06-27 ENCOUNTER — Encounter (INDEPENDENT_AMBULATORY_CARE_PROVIDER_SITE_OTHER): Payer: Self-pay

## 2019-07-04 ENCOUNTER — Encounter (INDEPENDENT_AMBULATORY_CARE_PROVIDER_SITE_OTHER): Payer: Self-pay

## 2019-07-11 ENCOUNTER — Encounter (INDEPENDENT_AMBULATORY_CARE_PROVIDER_SITE_OTHER): Payer: Self-pay

## 2019-07-18 ENCOUNTER — Encounter (INDEPENDENT_AMBULATORY_CARE_PROVIDER_SITE_OTHER): Payer: Self-pay

## 2019-07-25 ENCOUNTER — Encounter (INDEPENDENT_AMBULATORY_CARE_PROVIDER_SITE_OTHER): Payer: Self-pay

## 2019-08-01 ENCOUNTER — Encounter (INDEPENDENT_AMBULATORY_CARE_PROVIDER_SITE_OTHER): Payer: Self-pay

## 2019-08-08 ENCOUNTER — Encounter (INDEPENDENT_AMBULATORY_CARE_PROVIDER_SITE_OTHER): Payer: Self-pay

## 2019-08-12 ENCOUNTER — Telehealth: Payer: Self-pay | Admitting: *Deleted

## 2019-08-12 NOTE — Telephone Encounter (Signed)
Left vm for pt to return call regarding questionnaire for AI symptoms. Contact information provided for return call to discuss symptoms and f/u appt if needed.

## 2019-08-15 ENCOUNTER — Encounter (INDEPENDENT_AMBULATORY_CARE_PROVIDER_SITE_OTHER): Payer: Self-pay

## 2019-08-16 ENCOUNTER — Other Ambulatory Visit: Payer: Self-pay | Admitting: Adult Health

## 2019-08-16 ENCOUNTER — Other Ambulatory Visit: Payer: Self-pay

## 2019-08-16 ENCOUNTER — Inpatient Hospital Stay: Payer: Medicaid Other | Attending: Hematology and Oncology | Admitting: Adult Health

## 2019-08-16 ENCOUNTER — Telehealth: Payer: Self-pay | Admitting: *Deleted

## 2019-08-16 VITALS — BP 125/87 | HR 79 | Temp 97.8°F | Resp 16 | Ht 63.0 in | Wt 156.4 lb

## 2019-08-16 DIAGNOSIS — C50212 Malignant neoplasm of upper-inner quadrant of left female breast: Secondary | ICD-10-CM | POA: Diagnosis present

## 2019-08-16 DIAGNOSIS — Z79811 Long term (current) use of aromatase inhibitors: Secondary | ICD-10-CM | POA: Insufficient documentation

## 2019-08-16 DIAGNOSIS — E2839 Other primary ovarian failure: Secondary | ICD-10-CM | POA: Diagnosis not present

## 2019-08-16 DIAGNOSIS — Z170 Estrogen receptor positive status [ER+]: Secondary | ICD-10-CM | POA: Insufficient documentation

## 2019-08-16 DIAGNOSIS — N644 Mastodynia: Secondary | ICD-10-CM

## 2019-08-16 NOTE — Telephone Encounter (Signed)
Called pt with appt time and date for mammogram, bone density, and ultrasound. Made aware to arrive by 6:50 for appts to start at 7am. Advised pt not to wear lotions, perfumes, or deodorants. As well as stopping multivitamins 2 days prior to appt. Pt was appreciative and verbalized understanding.

## 2019-08-16 NOTE — Progress Notes (Signed)
SURVIVORSHIP  VISIT:   BRIEF ONCOLOGIC HISTORY:  Oncology History  Malignant neoplasm of upper-inner quadrant of left breast in female, estrogen receptor positive ([place])  02/11/2019 Initial Diagnosis   Routine screening mammogram detected a 30m mass with associated architectural distortion in the upper left breast, no axillary adenopathy. 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   02/16/2019 Cancer Staging   Staging form: Breast, AJCC 8th Edition - Clinical stage from 02/16/2019: Stage IA (cT1a, cN0, cM0, G2, ER+, PR+, HER2-) - Signed by GNicholas Lose MD on 02/16/2019   03/15/2019 Surgery   Left lumpectomy ([name]: IDC with DCIS, 0.3cm, grade 1, clear margins. ER positive, PR positive, HER-2 negative. 4 left axillary lymph nodes negative.   03/15/2019 Cancer Staging   Staging form: Breast, AJCC 8th Edition - Pathologic stage from 03/15/2019: Stage IA (pT1a, pN0, cM0, G1, ER+, PR+, HER2-) - Signed by CGardenia Phlegm NP on 07/18/2019   04/12/2019 - 05/10/2019 Radiation Therapy   The patient initially received a dose of 40.05 Gy in 15 fractions to the breast using whole-breast tangent fields. This was delivered using a 3-D conformal technique. The pt received a boost delivering an additional 12 Gy in 6 fractions using a electron boost with 144m electrons. The total dose was 52.05 Gy.   05/2019 - 05/2024 Anti-estrogen oral therapy   Anastrozole     INTERVAL HISTORY:  Ms. BeNordmanno review her survivorship care plan detailing her treatment course for breast cancer, as well as monitoring long-term side effects of that treatment, education regarding health maintenance, screening, and overall wellness and health promotion.     Overall, Ms. BeBickerteports feeling quite well.  She is taking Anastrozole daily.  She has hot flashes, worse at night, she is taking effexor for these.  She also has arthralgias, and some days are worse  than others.  She takes tylenol arthritis regularly.  She was recommended to take advil/tylenol and it didn't help, so she didn't continue it.  She notes a new knot in her left axilla that started after radiation therapy.    REVIEW OF SYSTEMS:  Review of Systems  Constitutional: Negative for appetite change, chills, fatigue and unexpected weight change.  HENT:   Negative for hearing loss, lump/mass and trouble swallowing.   Eyes: Negative for eye problems and icterus.  Respiratory: Negative for chest tightness, cough and shortness of breath.   Cardiovascular: Negative for chest pain, leg swelling and palpitations.  Gastrointestinal: Negative for abdominal distention, abdominal pain, constipation, diarrhea, nausea and vomiting.  Endocrine: Positive for hot flashes.  Genitourinary: Negative for difficulty urinating.   Musculoskeletal: Positive for arthralgias.  Skin: Negative for itching and rash.  Neurological: Negative for dizziness, extremity weakness, headaches and numbness.  Hematological: Negative for adenopathy. Does not bruise/bleed easily.  Psychiatric/Behavioral: Negative for depression. The patient is not nervous/anxious.   Breast: Denies any new nodularity, masses, tenderness, nipple changes, or nipple discharge.   ONCOLOGY TREATMENT TEAM:  1. Surgeon[facility]  [facility] 2. Medical Oncologist: Dr. GuLindi Adie3. Radiation Oncologist: Dr. KiSondra Come  PAST MEDICAL/SURGICAL HISTORY:  Past Medical History:  Diagnosis Date  . Anxiety   . [facility] ([facility]  . Bron[place]itis, chronic ([facility]  . COPD (ch[place] obstructive pulmonary disease) ([facility]  . Degenerative disorder of bone    OF BACK  . Depression   . GERD (gastroesophageal reflux disease)   .  Headache(784.0)    History of migraines,  takes propanolol for headaches  . High cholesterol   . Hypertension   . Tobacco abuse    Past Surgical History:  Procedure Laterality Date  . ABDOMINAL HYSTERECTOMY     . ANKLE SURGERY Left    left ankle surgery as teenager per pt  . BREAST LUMPECTOMY WITH RADIOACTIVE SEED AND SENTINEL LYMPH NODE BIOPSY Left 03/15/2019   Procedure: LEFT BREAST LUMPECTOMY WITH RADIOACTIVE SEED AND AXILLARY SENTINEL LYMPH NODE BIOPSY;  Surgeon: [name], MD;  Location: [facility];  Service: General;  Laterality: Left;  . CHOLECYSTECTOMY  03/13/2011   Procedure: LAPAROSCOPIC CHOLECYSTECTOMY;  Surgeon: Donato Heinz;  Location: [facility];  Service: General;  Laterality: N/A;     ALLERGIES:  Allergies  Allergen Reactions  . Codeine Hives and Nausea And Vomiting  . Morphine And Related Nausea Only  . Penicillins Hives    Did it involve swelling of the face/tongue/throat, SOB, or low BP? No Did it involve sudden or severe rash/hives, skin peeling, or any reaction on the inside of your mouth or nose? No Did you need to seek medical attention at a hospital or doctor's office? No When did it last happen?10+ years If all above answers are "NO", may proceed with cephalosporin use.      CURRENT MEDICATIONS:  Outpatient Encounter Medications as of 08/16/2019  Medication Sig  . acetaminophen (TYLENOL) 500 MG tablet Take 1,000 mg by mouth 2 (two) times daily as needed for moderate pain or headache.  . anastrozole (ARIMIDEX) 1 MG tablet Take 1 tablet (1 mg total) by mouth daily.  . APPLE CIDER VINEGAR PO Take 1 tablet by mouth daily.  . Aspirin-Caffeine (BC FAST PAIN RELIEF PO) Take 1 packet by mouth daily as needed (headache).  Marland Kitchen atorvastatin (LIPITOR) 20 MG tablet Take 1 tablet (20 mg total) by mouth daily.  Marland Kitchen loratadine (CLARITIN) 10 MG tablet Take 10 mg by mouth daily as needed for allergies.   . Multiple Vitamin (MULTIVITAMIN WITH MINERALS) TABS tablet Take 1 tablet by mouth daily.  Marland Kitchen omeprazole (PRILOSEC) 20 MG capsule Take 1 capsule (20 mg total) by mouth daily.  Marland Kitchen venlafaxine XR (EFFEXOR-XR) 75 MG 24 hr capsule Take 1 capsule (75 mg total) by mouth daily with  breakfast.   No facility-administered encounter medications on file as of 08/16/2019.     ONCOLOGIC FAMILY HISTORY:  Family History  Problem Relation Age of Onset  . Hypertension Mother   . Diabetes Mother   . Asthma Mother   . Hypertension Father   . COPD Father   . Heart attack Father   . Hyperlipidemia Brother      GENETIC COUNSELING/TESTING: Not at this time  SOCIAL HISTORY:  Social History   Socioeconomic History  . Marital status: Divorced    Spouse name: Not on file  . Number of children: 0  . Years of education: Not on file  . Highest education level: 9th grade  Occupational History  . Not on file  Tobacco Use  . Smoking status: Current Every Day Smoker    Packs/day: 1.00    Years: 39.00    Pack years: 39.00    Types: Cigarettes  . Smokeless tobacco: Never Used  Substance and Sexual Activity  . Alcohol use: No  . Drug use: Yes    Types: Marijuana    Comment: uses daily  . Sexual activity: Yes    Birth control/protection: Surgical  Other Topics Concern  .  Not on file  Social History Narrative  . Not on file   Social Determinants of Health   Financial Resource Strain:   . Difficulty of Paying Living Expenses: Not on file  Food Insecurity:   . Worried About Charity fundraiser in the Last Year: Not on file  . Ran Out of Food in the Last Year: Not on file  Transportation Needs: No Transportation Needs  . Lack of Transportation (Medical): No  . Lack of Transportation (Non-Medical): No  Physical Activity:   . Days of Exercise per Week: Not on file  . Minutes of Exercise per Session: Not on file  Stress:   . Feeling of Stress : Not on file  Social Connections:   . Frequency of Communication with Friends and Family: Not on file  . Frequency of Social Gatherings with Friends and Family: Not on file  . Attends Religious Services: Not on file  . Active Member of Clubs or Organizations: Not on file  . Attends Archivist Meetings: Not on  file  . Marital Status: Not on file  Intimate Partner Violence:   . Fear of Current or Ex-Partner: Not on file  . Emotionally Abused: Not on file  . Physically Abused: Not on file  . Sexually Abused: Not on file     OBSERVATIONS/OBJECTIVE:  BP 125/87 (BP Location: Left Arm, Patient Position: Sitting)   Pulse 79   Temp 97.8 F (36.6 C) (Temporal)   Resp 16   Ht '5\' 3"'  (1.6 m)   Wt 156 lb 6.4 oz (70.9 kg)   SpO2 99%   BMI 27.71 kg/m  GENERAL: Patient is a well appearing female in no acute distress HEENT:  Sclerae anicteric.  Oropharynx clear and moist. No ulcerations or evidence of oropharyngeal candidiasis. Neck is supple.  NODES:  No cervical, supraclavicular, or axillary lymphadenopathy palpated.  BREAST EXAM:  Small 0.7 cm nodule in left axilla under scar line, left breast s/p lumpectomy and radiation, no sign of local recurrence, right breast benign LUNGS:  Clear to auscultation bilaterally.  No wheezes or rhonchi. HEART:  Regular rate and rhythm. No murmur appreciated. ABDOMEN:  Soft, nontender.  Positive, normoactive bowel sounds. No organomegaly palpated. MSK:  No focal spinal tenderness to palpation. Full range of motion bilaterally in the upper extremities. EXTREMITIES:  No peripheral edema.   SKIN:  Clear with no obvious rashes or skin changes. No nail dyscrasia. NEURO:  Nonfocal. Well oriented.  Appropriate affect.    LABORATORY DATA:  None for this visit.  DIAGNOSTIC IMAGING:  None for this visit.      ASSESSMENT AND PLAN:  Ms.. Filler is a pleasant 54 y.o. female with Stage IA left breast invasive ductal carcinoma, ER+/PR+/HER2-, diagnosed in 01/2019, treated with lumpectomy, adjuvant radiation therapy, and anti-estrogen therapy with Anastrozole beginning in 05/2019.  She presents to the Survivorship Clinic for our initial meeting and routine follow-up post-completion of treatment for breast cancer.    1. Stage IA left breast cancer:  Ms. Figeroa is  continuing to recover from definitive treatment for breast cancer. She will follow-up with her medical oncologist, Dr. Lindi Adie in 6 months with history and physical exam per surveillance protocol.  She will continue her anti-estrogen therapy with Anastrozole. She is having some arthralgias and hot flashes, and hopefully those will subside after the next couple of months.  Today, a comprehensive survivorship care plan and treatment summary was reviewed with the patient today detailing her breast cancer diagnosis, treatment  course, potential late/long-term effects of treatment, appropriate follow-up care with recommendations for the future, and patient education resources.  A copy of this summary, along with a letter will be sent to the patient's primary care provider via mail/fax/In Basket message after today's visit.    2. Left axillary nodule: Ordered an ultrasound to evaluate  3. Bone health:  Given Ms. Theiler's age/history of breast cancer and her current treatment regimen including anti-estrogen therapy with Anastrozole, she is at risk for bone demineralization.  I placed an order for a bone density to be completed when she has her mammogram.  In the meantime, she was encouraged to increase her consumption of foods rich in calcium, as well as increase her weight-bearing activities.  She was given education on specific activities to promote bone health.  4. Cancer screening:  Due to Ms. Knippel's history and her age, she should receive screening for skin cancers, colon cancer, and gynecologic cancers.  The information and recommendations are listed on the patient's comprehensive care plan/treatment summary and were reviewed in detail with the patient.    5. Health maintenance and wellness promotion: Ms. Cherney was encouraged to consume 5-7 servings of fruits and vegetables per day. We reviewed the "Nutrition Rainbow" handout, as well as the handout "Take Control of Your Health and Reduce Your Cancer  Risk" from the [employer].  She was also encouraged to engage in moderate to vigorous exercise for 30 minutes per day most days of the week. We discussed the [employer] fitness program, which is designed for cancer survivors to help them become more physically fit after cancer treatments.  She was instructed to limit her alcohol consumption and was encouraged stop smoking.     6. Support services/counseling: It is not uncommon for this period of the patient's cancer care trajectory to be one of many emotions and stressors.  We discussed how this can be increasingly difficult during the times of quarantine and social distancing due to the COVID-19 pandemic.   She was given information regarding our available services and encouraged to contact me with any questions or for help enrolling in any of our support group/programs.    Follow up instructions:    -Return to cancer center 6 months for f/u with Dr. Lindi Adie  -Mammogram due  -ultrasound of the left axilla -bone density -Follow up with me in 12 months  The patient was provided an opportunity to ask questions and all were answered. The patient agreed with the plan and demonstrated an understanding of the instructions.   Total encounter time: 45 minutes*  Wilber Bihari, NP 08/18/19 7:59 AM Medical Oncology and Hematology [facility] [place], [place] [address] Tel. [phone]    Fax. [phone]  *Total Encounter Time as defined by the Centers for Medicare and Medicaid Services includes, in addition to the face-to-face time of a patient visit (documented in the note above) non-face-to-face time: obtaining and reviewing outside history, ordering and reviewing medications, tests or procedures, care coordination (communications with other health care professionals or caregivers) and documentation in the medical record.

## 2019-08-17 ENCOUNTER — Telehealth: Payer: Self-pay | Admitting: Hematology and Oncology

## 2019-08-17 NOTE — Telephone Encounter (Signed)
I left a message regarding schedule  

## 2019-08-18 ENCOUNTER — Telehealth: Payer: Self-pay | Admitting: Adult Health

## 2019-08-18 NOTE — Telephone Encounter (Signed)
Per 2/18 sch msg. Pt is aware of appt date and time. Reminder letter has been sent.

## 2019-08-22 ENCOUNTER — Encounter (INDEPENDENT_AMBULATORY_CARE_PROVIDER_SITE_OTHER): Payer: Self-pay

## 2019-08-29 ENCOUNTER — Encounter (INDEPENDENT_AMBULATORY_CARE_PROVIDER_SITE_OTHER): Payer: Self-pay

## 2019-09-05 ENCOUNTER — Encounter (INDEPENDENT_AMBULATORY_CARE_PROVIDER_SITE_OTHER): Payer: Self-pay

## 2019-09-06 ENCOUNTER — Other Ambulatory Visit: Payer: Self-pay | Admitting: Adult Health

## 2019-09-06 ENCOUNTER — Telehealth: Payer: Self-pay | Admitting: *Deleted

## 2019-09-06 ENCOUNTER — Ambulatory Visit
Admission: RE | Admit: 2019-09-06 | Discharge: 2019-09-06 | Disposition: A | Discharge status: Home / Self Care | Payer: Medicaid Other | Source: Ambulatory Visit | Attending: Adult Health | Admitting: Adult Health

## 2019-09-06 ENCOUNTER — Other Ambulatory Visit: Payer: Self-pay

## 2019-09-06 DIAGNOSIS — Z170 Estrogen receptor positive status [ER+]: Secondary | ICD-10-CM

## 2019-09-06 DIAGNOSIS — N644 Mastodynia: Secondary | ICD-10-CM

## 2019-09-06 DIAGNOSIS — C50212 Malignant neoplasm of upper-inner quadrant of left female breast: Secondary | ICD-10-CM

## 2019-09-06 DIAGNOSIS — E2839 Other primary ovarian failure: Secondary | ICD-10-CM

## 2019-09-06 DIAGNOSIS — N6320 Unspecified lump in the left breast, unspecified quadrant: Secondary | ICD-10-CM

## 2019-09-06 HISTORY — DX: Personal history of irradiation: Z92.3

## 2019-09-06 NOTE — Telephone Encounter (Signed)
Per Wilber Bihari, NP, called to make pt aware that her bone density scan was normal and to continue on calcium, vitamin d and weight bearing exercises. Pt verbalized understanding and no other questions or concerns at this time.

## 2019-09-12 ENCOUNTER — Encounter (INDEPENDENT_AMBULATORY_CARE_PROVIDER_SITE_OTHER): Payer: Self-pay

## 2019-09-19 ENCOUNTER — Encounter (INDEPENDENT_AMBULATORY_CARE_PROVIDER_SITE_OTHER): Payer: Self-pay

## 2019-09-26 ENCOUNTER — Encounter (INDEPENDENT_AMBULATORY_CARE_PROVIDER_SITE_OTHER): Payer: Self-pay

## 2019-10-03 ENCOUNTER — Encounter (INDEPENDENT_AMBULATORY_CARE_PROVIDER_SITE_OTHER): Payer: Self-pay

## 2019-10-17 ENCOUNTER — Encounter (INDEPENDENT_AMBULATORY_CARE_PROVIDER_SITE_OTHER): Payer: Self-pay

## 2019-10-24 ENCOUNTER — Encounter (INDEPENDENT_AMBULATORY_CARE_PROVIDER_SITE_OTHER): Payer: Self-pay

## 2019-10-31 ENCOUNTER — Encounter (INDEPENDENT_AMBULATORY_CARE_PROVIDER_SITE_OTHER): Payer: Self-pay

## 2019-11-07 ENCOUNTER — Encounter (INDEPENDENT_AMBULATORY_CARE_PROVIDER_SITE_OTHER): Payer: Self-pay

## 2019-11-14 ENCOUNTER — Encounter (INDEPENDENT_AMBULATORY_CARE_PROVIDER_SITE_OTHER): Payer: Self-pay

## 2019-11-21 ENCOUNTER — Encounter (INDEPENDENT_AMBULATORY_CARE_PROVIDER_SITE_OTHER): Payer: Self-pay

## 2019-11-28 ENCOUNTER — Encounter (INDEPENDENT_AMBULATORY_CARE_PROVIDER_SITE_OTHER): Payer: Self-pay

## 2019-12-04 ENCOUNTER — Encounter (INDEPENDENT_AMBULATORY_CARE_PROVIDER_SITE_OTHER): Payer: Self-pay

## 2019-12-05 ENCOUNTER — Encounter (INDEPENDENT_AMBULATORY_CARE_PROVIDER_SITE_OTHER): Payer: Self-pay

## 2019-12-07 ENCOUNTER — Other Ambulatory Visit: Payer: Medicaid Other

## 2019-12-12 ENCOUNTER — Encounter (INDEPENDENT_AMBULATORY_CARE_PROVIDER_SITE_OTHER): Payer: Self-pay

## 2019-12-13 ENCOUNTER — Telehealth: Payer: Self-pay | Admitting: *Deleted

## 2019-12-13 NOTE — Telephone Encounter (Signed)
Left message for a return phone call to inquire about her arthralgias from [facility].

## 2019-12-19 ENCOUNTER — Encounter (INDEPENDENT_AMBULATORY_CARE_PROVIDER_SITE_OTHER): Payer: Self-pay

## 2019-12-19 ENCOUNTER — Telehealth: Payer: Self-pay | Admitting: *Deleted

## 2019-12-19 NOTE — Telephone Encounter (Signed)
Received call back from patient stating she has been having a lot of joint pain and aches all over.  She started anastrozole in November 2020.  Informed she could stop the anastrozole for a few weeks and see if her symptoms improve and if so we could try her on a different AI.  Message to [place] as well to see if office appointment needed to discuss.  Patient verbalized understanding.

## 2019-12-20 ENCOUNTER — Telehealth: Payer: Self-pay | Admitting: Hematology and Oncology

## 2019-12-20 NOTE — Telephone Encounter (Signed)
Scheduled appt per 6/21 sch msg - unable to reach pt - left message with appt date and time on vmail.

## 2019-12-26 ENCOUNTER — Encounter (INDEPENDENT_AMBULATORY_CARE_PROVIDER_SITE_OTHER): Payer: Self-pay

## 2019-12-28 DIAGNOSIS — F419 Anxiety disorder, unspecified: Secondary | ICD-10-CM | POA: Insufficient documentation

## 2019-12-28 DIAGNOSIS — Z853 Personal history of malignant neoplasm of breast: Secondary | ICD-10-CM | POA: Insufficient documentation

## 2019-12-28 DIAGNOSIS — F319 Bipolar disorder, unspecified: Secondary | ICD-10-CM | POA: Insufficient documentation

## 2019-12-30 DIAGNOSIS — G2581 Restless legs syndrome: Secondary | ICD-10-CM | POA: Insufficient documentation

## 2020-01-02 ENCOUNTER — Encounter (INDEPENDENT_AMBULATORY_CARE_PROVIDER_SITE_OTHER): Payer: Self-pay

## 2020-01-09 ENCOUNTER — Encounter (INDEPENDENT_AMBULATORY_CARE_PROVIDER_SITE_OTHER): Payer: Self-pay

## 2020-01-10 ENCOUNTER — Encounter: Payer: Self-pay | Admitting: Adult Health

## 2020-01-10 ENCOUNTER — Inpatient Hospital Stay: Payer: Medicaid Other | Attending: Hematology and Oncology | Admitting: Adult Health

## 2020-01-10 ENCOUNTER — Other Ambulatory Visit: Payer: Self-pay

## 2020-01-10 VITALS — BP 129/85 | HR 97 | Temp 98.2°F | Resp 18 | Ht 63.0 in | Wt 166.2 lb

## 2020-01-10 DIAGNOSIS — Z170 Estrogen receptor positive status [ER+]: Secondary | ICD-10-CM | POA: Diagnosis not present

## 2020-01-10 DIAGNOSIS — C50212 Malignant neoplasm of upper-inner quadrant of left female breast: Secondary | ICD-10-CM | POA: Diagnosis not present

## 2020-01-10 DIAGNOSIS — Z79811 Long term (current) use of aromatase inhibitors: Secondary | ICD-10-CM | POA: Insufficient documentation

## 2020-01-10 DIAGNOSIS — F17210 Nicotine dependence, cigarettes, uncomplicated: Secondary | ICD-10-CM | POA: Insufficient documentation

## 2020-01-10 MED ORDER — LETROZOLE 2.5 MG PO TABS: 2.5000 mg | ORAL_TABLET | Freq: Every day | ORAL | 0 refills | Status: DC

## 2020-01-10 NOTE — Progress Notes (Signed)
[facility] Cancer Follow up:    Patient, No Pcp Per No address on file   DIAGNOSIS: Cancer Staging Malignant neoplasm of upper-inner quadrant of left breast in female, estrogen receptor positive ([place]) Staging form: Breast, AJCC 8th Edition - Clinical stage from 02/16/2019: Stage IA (cT1a, cN0, cM0, G2, ER+, PR+, HER2-) - Signed by [name], MD on 02/16/2019 - Pathologic stage from 03/15/2019: Stage IA (pT1a, pN0, cM0, G1, ER+, PR+, HER2-) - Signed by Gardenia Phlegm, NP on 07/18/2019   SUMMARY OF ONCOLOGIC HISTORY: Oncology History  Malignant neoplasm of upper-inner quadrant of left breast in female, estrogen receptor positive ([place])  02/11/2019 Initial Diagnosis   Routine screening mammogram detected a 26m mass with associated architectural distortion in the upper left breast, no axillary adenopathy. 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   02/16/2019 Cancer Staging   Staging form: Breast, AJCC 8th Edition - Clinical stage from 02/16/2019: Stage IA (cT1a, cN0, cM0, G2, ER+, PR+, HER2-) - Signed by GNicholas Lose MD on 02/16/2019   03/15/2019 Surgery   Left lumpectomy ([name]: IDC with DCIS, 0.3cm, grade 1, clear margins. ER positive, PR positive, HER-2 negative. 4 left axillary lymph nodes negative.   03/15/2019 Cancer Staging   Staging form: Breast, AJCC 8th Edition - Pathologic stage from 03/15/2019: Stage IA (pT1a, pN0, cM0, G1, ER+, PR+, HER2-) - Signed by CGardenia Phlegm NP on 07/18/2019   04/12/2019 - 05/10/2019 Radiation Therapy   The patient initially received a dose of 40.05 Gy in 15 fractions to the breast using whole-breast tangent fields. This was delivered using a 3-D conformal technique. The pt received a boost delivering an additional 12 Gy in 6 fractions using a electron boost with 182m electrons. The total dose was 52.05 Gy.   05/2019 - 05/2024 Anti-estrogen oral therapy    Anastrozole     CURRENT THERAPY: observation/anastrozole on hold  INTERVAL HISTORY: Elizabeth Skop89.o. female returns for evaluation of her estrogen positive breast cancer.  She was taking anastrozole daily and noted a significant increase in arthralgias related to this.  She stopped the anastrozole about 3 weeks ago.  She notes that her arthralgias are significantly improved.  She is now able to walk.  She has her mammogram and ultrasound of her breast scheduled for later this week.    She is still smoking cigarettes about 1.5 ppd.  She is working on cutting back.  She is also taking care of 9 puppies.    Patient Active Problem List   Diagnosis Date Noted  . Malignant neoplasm of upper-inner quadrant of left breast in female, estrogen receptor positive ([facility]08/14/2020  . Screening breast examination 02/08/2019  . Nausea with vomiting 02/09/2015  . Pain in the chest   . Gastroenteritis   . Hypertension   . Tobacco abuse   . Chest pain 02/08/2015    is allergic to codeine, morphine and related, and penicillins.  MEDICAL HISTORY: Past Medical History:  Diagnosis Date  . Anxiety   . Bipolar disorder ([facility]  . Bronchitis, chronic ([facility] [place]  . COPD (chronic obstructive pulmonary disease) ([facility]  . Degenerative disorder of bone    OF BACK  . Depression   . GERD (gastroesophageal reflux disease)   . Headache(784.0)    History of migraines,  takes propanolol for headaches  . High cholesterol   . Hypertension   . Personal history of  radiation therapy   . Tobacco abuse     SURGICAL HISTORY: Past Surgical History:  Procedure Laterality Date  . ABDOMINAL HYSTERECTOMY    . ANKLE SURGERY Left    left ankle surgery as teenager per pt  . BREAST LUMPECTOMY Left 03/15/2019  . BREAST LUMPECTOMY WITH RADIOACTIVE SEED AND SENTINEL LYMPH NODE BIOPSY Left 03/15/2019   Procedure: LEFT BREAST LUMPECTOMY WITH RADIOACTIVE SEED AND AXILLARY SENTINEL LYMPH NODE BIOPSY;  Surgeon: [name], MD;  Location: [facility];  Service: General;  Laterality: Left;  . CHOLECYSTECTOMY  03/13/2011   Procedure: LAPAROSCOPIC CHOLECYSTECTOMY;  Surgeon: Donato Heinz;  Location: [facility];  Service: General;  Laterality: N/A;    SOCIAL HISTORY: Social History   Socioeconomic History  . Marital status: Divorced    Spouse name: Not on file  . Number of children: 0  . Years of education: Not on file  . Highest education level: 9th grade  Occupational History  . Not on file  Tobacco Use  . Smoking status: Current Every Day Smoker    Packs/day: 1.00    Years: 39.00    Pack years: 39.00    Types: Cigarettes  . Smokeless tobacco: Never Used  Vaping Use  . Vaping Use: Never used  Substance and Sexual Activity  . Alcohol use: No  . Drug use: Yes    Types: Marijuana    Comment: uses daily  . Sexual activity: Yes    Birth control/protection: Surgical  Other Topics Concern  . Not on file  Social History Narrative  . Not on file   Social Determinants of Health   Financial Resource Strain:   . Difficulty of Paying Living Expenses:   Food Insecurity:   . Worried About Charity fundraiser in the Last Year:   . Arboriculturist in the Last Year:   Transportation Needs: No Transportation Needs  . Lack of Transportation (Medical): No  . Lack of Transportation (Non-Medical): No  Physical Activity:   . Days of Exercise per Week:   . Minutes of Exercise per Session:   Stress:   . Feeling of Stress :   Social Connections:   . Frequency of Communication with Friends and Family:   . Frequency of Social Gatherings with Friends and Family:   . Attends Religious Services:   . Active Member of Clubs or Organizations:   . Attends Archivist Meetings:   Marland Kitchen Marital Status:   Intimate Partner Violence:   . Fear of Current or Ex-Partner:   . Emotionally Abused:   Marland Kitchen Physically Abused:   . Sexually Abused:     FAMILY HISTORY: Family History  Problem Relation Age of Onset  .  Hypertension Mother   . Diabetes Mother   . Asthma Mother   . Hypertension Father   . COPD Father   . Heart attack Father   . Hyperlipidemia Brother     Review of Systems  Constitutional: Negative for appetite change, chills, fatigue, fever and unexpected weight change.  HENT:   Negative for hearing loss.   Eyes: Negative for eye problems and icterus.  Respiratory: Negative for chest tightness, cough and shortness of breath.   Cardiovascular: Negative for leg swelling and palpitations.  Gastrointestinal: Positive for nausea. Negative for abdominal distention, abdominal pain, constipation, diarrhea, rectal pain and vomiting.  Endocrine: Negative for hot flashes.  Musculoskeletal: Positive for arthralgias.  Skin: Negative for itching and rash.  Neurological: Negative for dizziness, extremity weakness, headaches  and numbness.  Hematological: Negative for adenopathy. Does not bruise/bleed easily.  Psychiatric/Behavioral: Negative for depression. The patient is not nervous/anxious.       PHYSICAL EXAMINATION  ECOG PERFORMANCE STATUS: 1 - Symptomatic but completely ambulatory  Vitals:   01/10/20 1304  BP: 129/85  Pulse: 97  Resp: 18  Temp: 98.2 F (36.8 C)  SpO2: 99%    Physical Exam Constitutional:      General: She is not in acute distress.    Appearance: Normal appearance. She is not toxic-appearing.  HENT:     Head: Normocephalic and atraumatic.  Eyes:     General: No scleral icterus. Cardiovascular:     Rate and Rhythm: Normal rate and regular rhythm.     Pulses: Normal pulses.     Heart sounds: Normal heart sounds.  Pulmonary:     Effort: Pulmonary effort is normal.     Breath sounds: Normal breath sounds.  Abdominal:     General: Abdomen is flat. Bowel sounds are normal.     Palpations: Abdomen is soft.  Genitourinary:    Rectum: Guaiac result negative.  Musculoskeletal:        General: No swelling or deformity.     Cervical back: Neck supple.   Lymphadenopathy:     Cervical: No cervical adenopathy.  Skin:    General: Skin is warm and dry.     Capillary Refill: Capillary refill takes less than 2 seconds.     Findings: No rash.  Neurological:     General: No focal deficit present.     Mental Status: She is alert.  Psychiatric:        Mood and Affect: Mood normal.        Behavior: Behavior normal.     LABORATORY DATA:  CBC    Component Value Date/Time   WBC 9.7 03/09/2019 0939   RBC 5.28 (H) 03/09/2019 0939   HGB 15.7 (H) 03/09/2019 0939   HGB 14.3 02/16/2019 0837   HCT 47.2 (H) 03/09/2019 0939   PLT 260 03/09/2019 0939   PLT 251 02/16/2019 0837   MCV 89.4 03/09/2019 0939   MCH 29.7 03/09/2019 0939   MCHC 33.3 03/09/2019 0939   RDW 12.6 03/09/2019 0939   LYMPHSABS 2.0 02/16/2019 0837   MONOABS 0.6 02/16/2019 0837   EOSABS 0.1 02/16/2019 0837   BASOSABS 0.0 02/16/2019 0837    CMP     Component Value Date/Time   NA 139 03/09/2019 0939   K 3.8 03/09/2019 0939   CL 104 03/09/2019 0939   CO2 25 03/09/2019 0939   GLUCOSE 81 03/09/2019 0939   BUN 6 03/09/2019 0939   CREATININE 0.86 03/09/2019 0939   CREATININE 0.85 02/16/2019 0837   CALCIUM 9.4 03/09/2019 0939   PROT 7.0 03/09/2019 0939   ALBUMIN 4.2 03/09/2019 0939   AST 20 03/09/2019 0939   AST 14 (L) 02/16/2019 0837   ALT 19 03/09/2019 0939   ALT 15 02/16/2019 0837   ALKPHOS 96 03/09/2019 0939   BILITOT 0.7 03/09/2019 0939   BILITOT 0.7 02/16/2019 0837   GFRNONAA >60 03/09/2019 0939   GFRNONAA >60 02/16/2019 0837   GFRAA >60 03/09/2019 0939   GFRAA >60 02/16/2019 0837        ASSESSMENT and THERAPY PLAN:   Malignant neoplasm of upper-inner quadrant of left breast in female, estrogen receptor positive ([place]) 03/15/2019:Left lumpectomy [name]): IDC with DCIS, 0.3cm, grade 1, clear margins, and 4 left axillary lymph nodes negative.  ER 100%, PR 90%,  Ki-67 10%, HER-2 negative, T1 a N0 stage Ia Adjuvant radiation 04/13/2019-05/10/2019 Anastrozole  from 05/2019 through 11/2019--stopped due to significant arthralgias.    ____________________________________________________________________________________________________  Nishka's arhtrlagias are improved off of the anastrozole.  She will stay off of this treatment. I have sent in Letrozole for her to take daily and we will se if she can tolerate it.  If not, she may need Tamoxifen.    She continues to smoke cigarettes.  She is not yet ready to quit.  Denea will undergo mammogram later this week.  We will see her back in 4 weeks for f/u with Dr. Lindi Adie to evaluate her tolerance of Letrozole.    She knows to call for any questions that may arise between now and her next appointment.  We are happy to see her sooner if needed.  Total encounter time: 20 minutes*  Wilber Bihari, NP 01/10/20 4:24 PM Medical Oncology and Hematology [facility] [place], [place] [address] Tel. [phone]    Fax. [phone]  *Total Encounter Time as defined by the Centers for Medicare and Medicaid Services includes, in addition to the face-to-face time of a patient visit (documented in the note above) non-face-to-face time: obtaining and reviewing outside history, ordering and reviewing medications, tests or procedures, care coordination (communications with other health care professionals or caregivers) and documentation in the medical record.

## 2020-01-10 NOTE — Assessment & Plan Note (Signed)
03/15/2019:Left lumpectomy Elizabeth Bullock): IDC with DCIS, 0.3cm, grade 1, clear margins, and 4 left axillary lymph nodes negative.  ER 100%, PR 90%, Ki-67 10%, HER-2 negative, T1 a N0 stage Ia Adjuvant radiation 04/13/2019-05/10/2019 Anastrozole from 05/2019 through 11/2019--stopped due to significant arthralgias.    ____________________________________________________________________________________________________  Elizabeth Bullock's arhtrlagias are improved off of the anastrozole.  She will stay off of this treatment. I have sent in Letrozole for her to take daily and we will se if she can tolerate it.  If not, she may need Tamoxifen.    She continues to smoke cigarettes.  She is not yet ready to quit.  Elizabeth Bullock will undergo mammogram later this week.  We will see her back in 4 weeks for f/u with Dr. Lindi Adie to evaluate her tolerance of Letrozole.

## 2020-01-13 ENCOUNTER — Ambulatory Visit
Admission: RE | Admit: 2020-01-13 | Discharge: 2020-01-13 | Disposition: A | Discharge status: Home / Self Care | Payer: Medicaid Other | Source: Ambulatory Visit | Attending: Adult Health | Admitting: Adult Health

## 2020-01-13 ENCOUNTER — Other Ambulatory Visit: Payer: Self-pay

## 2020-01-13 DIAGNOSIS — Z170 Estrogen receptor positive status [ER+]: Secondary | ICD-10-CM

## 2020-01-13 DIAGNOSIS — N6320 Unspecified lump in the left breast, unspecified quadrant: Secondary | ICD-10-CM

## 2020-01-14 ENCOUNTER — Other Ambulatory Visit: Payer: Self-pay | Admitting: Adult Health

## 2020-01-16 ENCOUNTER — Encounter (INDEPENDENT_AMBULATORY_CARE_PROVIDER_SITE_OTHER): Payer: Self-pay

## 2020-01-16 NOTE — Telephone Encounter (Signed)
Is patient tolerating well, does she want a 90 day supply?

## 2020-01-16 NOTE — Telephone Encounter (Signed)
Called pt per below request. Pt states she is doing well with it and feels it is really helping her, also states she would like a 90 day supply.

## 2020-01-23 ENCOUNTER — Encounter (INDEPENDENT_AMBULATORY_CARE_PROVIDER_SITE_OTHER): Payer: Self-pay

## 2020-01-27 DIAGNOSIS — T50905A Adverse effect of unspecified drugs, medicaments and biological substances, initial encounter: Secondary | ICD-10-CM | POA: Insufficient documentation

## 2020-01-27 DIAGNOSIS — K219 Gastro-esophageal reflux disease without esophagitis: Secondary | ICD-10-CM | POA: Insufficient documentation

## 2020-01-30 ENCOUNTER — Encounter (INDEPENDENT_AMBULATORY_CARE_PROVIDER_SITE_OTHER): Payer: Self-pay

## 2020-02-06 ENCOUNTER — Encounter (INDEPENDENT_AMBULATORY_CARE_PROVIDER_SITE_OTHER): Payer: Self-pay

## 2020-02-10 ENCOUNTER — Other Ambulatory Visit: Payer: Self-pay | Admitting: Adult Health

## 2020-02-10 DIAGNOSIS — Z170 Estrogen receptor positive status [ER+]: Secondary | ICD-10-CM

## 2020-02-10 DIAGNOSIS — C50212 Malignant neoplasm of upper-inner quadrant of left female breast: Secondary | ICD-10-CM

## 2020-02-13 ENCOUNTER — Encounter (INDEPENDENT_AMBULATORY_CARE_PROVIDER_SITE_OTHER): Payer: Self-pay

## 2020-02-14 ENCOUNTER — Other Ambulatory Visit: Payer: Self-pay

## 2020-02-14 ENCOUNTER — Inpatient Hospital Stay: Payer: Medicaid Other | Attending: Hematology and Oncology | Admitting: Hematology and Oncology

## 2020-02-14 DIAGNOSIS — F17210 Nicotine dependence, cigarettes, uncomplicated: Secondary | ICD-10-CM | POA: Diagnosis not present

## 2020-02-14 DIAGNOSIS — C50212 Malignant neoplasm of upper-inner quadrant of left female breast: Secondary | ICD-10-CM | POA: Insufficient documentation

## 2020-02-14 DIAGNOSIS — Z79811 Long term (current) use of aromatase inhibitors: Secondary | ICD-10-CM | POA: Insufficient documentation

## 2020-02-14 DIAGNOSIS — Z170 Estrogen receptor positive status [ER+]: Secondary | ICD-10-CM | POA: Diagnosis not present

## 2020-02-14 MED ORDER — TAMOXIFEN CITRATE 20 MG PO TABS: 10.0000 mg | ORAL_TABLET | Freq: Every day | ORAL | 0 refills | Status: DC

## 2020-02-14 NOTE — Assessment & Plan Note (Signed)
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 Switched to letrozole 01/10/2020 ______________________________________________________________________________________________  Letrozole toxicities:   She continues to smoke cigarettes.  She is not yet ready to quit.  Breast cancer surveillance: 1. Mammogram and ultrasound 01/13/2020: No evidence of malignancy in either breast. Stable 3 mm probably normal intramammary lymph node in the axillary tail of the left breast at the location of the surgical scar. Interval 3 small oil cysts left axilla.  2. breast exam July 2021: Benign  Return to clinic in 6 months for follow-up

## 2020-02-14 NOTE — Progress Notes (Signed)
Patient Care Team: Patient, No Pcp Per as PCP - General (General Practice) [name], MD as Consulting Physician (General Surgery) [name], MD as Consulting Physician (Hematology and Oncology) [name], MD as Consulting Physician (Radiation Oncology)  DIAGNOSIS:  Encounter Diagnosis  Name Primary?  . Malignant neoplasm of upper-inner quadrant of left breast in female, estrogen receptor positive ([place])     SUMMARY OF ONCOLOGIC HISTORY: Oncology History  Malignant neoplasm of upper-inner quadrant of left breast in female, estrogen receptor positive ([place])  02/11/2019 Initial Diagnosis   Routine screening mammogram detected a 7m mass with associated architectural distortion in the upper left breast, no axillary adenopathy. 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   02/16/2019 Cancer Staging   Staging form: Breast, AJCC 8th Edition - Clinical stage from 02/16/2019: Stage IA (cT1a, cN0, cM0, G2, ER+, PR+, HER2-) - Signed by GNicholas Lose MD on 02/16/2019   03/15/2019 Surgery   Left lumpectomy ([name]: IDC with DCIS, 0.3cm, grade 1, clear margins. ER positive, PR positive, HER-2 negative. 4 left axillary lymph nodes negative.   03/15/2019 Cancer Staging   Staging form: Breast, AJCC 8th Edition - Pathologic stage from 03/15/2019: Stage IA (pT1a, pN0, cM0, G1, ER+, PR+, HER2-) - Signed by CGardenia Phlegm NP on 07/18/2019   04/12/2019 - 05/10/2019 Radiation Therapy   The patient initially received a dose of 40.05 Gy in 15 fractions to the breast using whole-breast tangent fields. This was delivered using a 3-D conformal technique. The pt received a boost delivering an additional 12 Gy in 6 fractions using a electron boost with 156m electrons. The total dose was 52.05 Gy.   05/2019 - 05/2024 Anti-estrogen oral therapy   Anastrozole     CHIEF COMPLIANT:   INTERVAL HISTORY: ShLavaughn Bisigs  a   ALLERGIES:  is allergic to codeine, morphine and related, and penicillins.  MEDICATIONS:  Current Outpatient Medications  Medication Sig Dispense Refill  . acetaminophen (TYLENOL) 500 MG tablet Take 1,000 mg by mouth 2 (two) times daily as needed for moderate pain or headache.    . APPLE CIDER VINEGAR PO Take 1 tablet by mouth daily.    . Aspirin-Caffeine (BC FAST PAIN RELIEF PO) Take 1 packet by mouth daily as needed (headache).    . Marland Kitchentorvastatin (LIPITOR) 20 MG tablet Take 1 tablet (20 mg total) by mouth daily. 90 tablet 1  . letrozole (FEMARA) 2.5 MG tablet Take 1 tablet by mouth once daily 90 tablet 4  . loratadine (CLARITIN) 10 MG tablet Take 10 mg by mouth daily as needed for allergies.     . Multiple Vitamin (MULTIVITAMIN WITH MINERALS) TABS tablet Take 1 tablet by mouth daily.    . Marland Kitchenmeprazole (PRILOSEC) 20 MG capsule Take 1 capsule (20 mg total) by mouth daily. 30 capsule 0  . venlafaxine XR (EFFEXOR-XR) 75 MG 24 hr capsule TAKE 1 CAPSULE BY MOUTH ONCE DAILY WITH BREAKFAST 90 capsule 3   No current facility-administered medications for this visit.    PHYSICAL EXAMINATION: ECOG PERFORMANCE STATUS: 1 - Symptomatic but completely ambulatory  Vitals:   02/14/20 1003  BP: (!) 144/79  Pulse: 74  Resp: 20  Temp: (!) 95.6 F (35.3 C)  SpO2: 100%   Filed Weights   02/14/20 1003  Weight: 166 lb 12.8 oz (75.7 kg)     LABORATORY DATA:  I have reviewed the data as listed CMP Latest Ref Rng &  Units 03/09/2019 02/16/2019 12/23/2018  Glucose 70 - 99 mg/dL 81 94 100(H)  BUN 6 - 20 mg/dL '6 6 10  ' Creatinine 0.44 - 1.00 mg/dL 0.86 0.85 0.80  Sodium 135 - 145 mmol/L 139 140 139  Potassium 3.5 - 5.1 mmol/L 3.8 3.5 4.2  Chloride 98 - 111 mmol/L 104 103 102  CO2 22 - 32 mmol/L '25 26 23  ' Calcium 8.9 - 10.3 mg/dL 9.4 9.1 9.4  Total Protein 6.5 - 8.1 g/dL 7.0 6.6 7.1  Total Bilirubin 0.3 - 1.2 mg/dL 0.7 0.7 0.7  Alkaline Phos 38 - 126 U/L 96 91 86  AST 15 - 41 U/L 20 14(L) 16  ALT 0  - 44 U/L '19 15 17    ' Lab Results  Component Value Date   WBC 9.7 03/09/2019   HGB 15.7 (H) 03/09/2019   HCT 47.2 (H) 03/09/2019   MCV 89.4 03/09/2019   PLT 260 03/09/2019   NEUTROABS 6.8 02/16/2019    ASSESSMENT & PLAN:  Malignant neoplasm of upper-inner quadrant of left breast in female, estrogen receptor positive ([place]) 03/15/2019:Left lumpectomy [name]): IDC with DCIS, 0.3cm, grade 1, clear margins, and 4 left axillary lymph nodes negative.ER 100%, PR 90%, Ki-67 10%, HER-2 negative, T1 a N0 stage Ia Adjuvant radiation 04/13/2019-05/10/2019 Anastrozole from 05/2019 through 11/2019--stopped due to significant arthralgias. Switched to letrozole 7/13/2021discontinued 02/14/2020 switched to tamoxifen 03/06/2020 ______________________________________________________________________________________________  Letrozole toxicities:  Worsened arthralgias and myalgias. After much discussion we decided to switch her from letrozole to tamoxifen.  She will start tamoxifen 10 mg daily.  She will start this March 06, 2020.  She continues to smoke cigarettes.  She is not yet ready to quit.  Breast cancer surveillance: 1. Mammogram and ultrasound 01/13/2020: No evidence of malignancy in either breast. Stable 3 mm probably normal intramammary lymph node in the axillary tail of the left breast at the location of the surgical scar. Interval 3 small oil cysts left axilla.  2. breast exam July 2021: Benign  Telephone visit at the end of September to review toxicities to tamoxifen.    No orders of the defined types were placed in this encounter.  The patient has a good understanding of the overall plan. she agrees with it. she will call with any problems that may develop before the next visit here. Total time spent: 30 mins including face to face time and time spent for planning, charting and co-ordination of care   Harriette Ohara, MD 02/14/20

## 2020-02-20 ENCOUNTER — Encounter (INDEPENDENT_AMBULATORY_CARE_PROVIDER_SITE_OTHER): Payer: Self-pay

## 2020-02-27 ENCOUNTER — Encounter (INDEPENDENT_AMBULATORY_CARE_PROVIDER_SITE_OTHER): Payer: Self-pay

## 2020-03-05 ENCOUNTER — Encounter (INDEPENDENT_AMBULATORY_CARE_PROVIDER_SITE_OTHER): Payer: Self-pay

## 2020-03-12 ENCOUNTER — Other Ambulatory Visit: Payer: Self-pay | Admitting: *Deleted

## 2020-03-12 ENCOUNTER — Ambulatory Visit
Admission: RE | Admit: 2020-03-12 | Discharge: 2020-03-12 | Disposition: A | Discharge status: Home / Self Care | Payer: Medicaid Other | Source: Ambulatory Visit | Attending: *Deleted | Admitting: *Deleted

## 2020-03-12 ENCOUNTER — Encounter (INDEPENDENT_AMBULATORY_CARE_PROVIDER_SITE_OTHER): Payer: Self-pay

## 2020-03-12 DIAGNOSIS — M545 Low back pain, unspecified: Secondary | ICD-10-CM

## 2020-03-12 DIAGNOSIS — M79601 Pain in right arm: Secondary | ICD-10-CM

## 2020-03-12 DIAGNOSIS — W19XXXD Unspecified fall, subsequent encounter: Secondary | ICD-10-CM

## 2020-03-12 DIAGNOSIS — M79602 Pain in left arm: Secondary | ICD-10-CM

## 2020-03-12 DIAGNOSIS — M5136 Other intervertebral disc degeneration, lumbar region: Secondary | ICD-10-CM | POA: Insufficient documentation

## 2020-03-12 DIAGNOSIS — G5793 Unspecified mononeuropathy of bilateral lower limbs: Secondary | ICD-10-CM | POA: Insufficient documentation

## 2020-03-12 DIAGNOSIS — M792 Neuralgia and neuritis, unspecified: Secondary | ICD-10-CM | POA: Insufficient documentation

## 2020-03-19 ENCOUNTER — Encounter (INDEPENDENT_AMBULATORY_CARE_PROVIDER_SITE_OTHER): Payer: Self-pay

## 2020-03-25 NOTE — Progress Notes (Signed)
  HEMATOLOGY-ONCOLOGY TELEPHONE VISIT PROGRESS NOTE I called her couple of times but she was not available.  I left a voicemail for her to call us back.

## 2020-03-26 ENCOUNTER — Inpatient Hospital Stay: Payer: Medicaid Other | Attending: Hematology and Oncology | Admitting: Hematology and Oncology

## 2020-03-26 ENCOUNTER — Encounter (INDEPENDENT_AMBULATORY_CARE_PROVIDER_SITE_OTHER): Payer: Self-pay

## 2020-03-26 DIAGNOSIS — Z170 Estrogen receptor positive status [ER+]: Secondary | ICD-10-CM

## 2020-03-26 NOTE — Assessment & Plan Note (Signed)
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 Switched to letrozole 7/13/2021discontinued 02/14/2020 switched to tamoxifen 03/06/2020 ______________________________________________________________________________________________  Letrozole toxicities: Worsened arthralgias and myalgias.  Switch to tamoxifen 03/06/2020 Tamoxifen toxicities:  She continues to smoke cigarettes. She is not yet ready to quit.  Breast cancer surveillance: 1. Mammogram and ultrasound 01/13/2020: No evidence of malignancy in either breast. Stable 3 mm probably normal intramammary lymph node in the axillary tail of the left breast at the location of the surgical scar. Interval 3 small oil cysts left axilla.  2. breast exam July 2021: Benign  Telephone visit at the end of September to review toxicities to tamoxifen.

## 2020-03-27 ENCOUNTER — Telehealth: Payer: Self-pay | Admitting: Hematology and Oncology

## 2020-03-27 NOTE — Telephone Encounter (Signed)
No 9/27 los, no changes made to pt schedule

## 2020-04-02 ENCOUNTER — Encounter (INDEPENDENT_AMBULATORY_CARE_PROVIDER_SITE_OTHER): Payer: Self-pay

## 2020-04-05 DIAGNOSIS — G4710 Hypersomnia, unspecified: Secondary | ICD-10-CM | POA: Insufficient documentation

## 2020-04-05 DIAGNOSIS — R0683 Snoring: Secondary | ICD-10-CM | POA: Insufficient documentation

## 2020-04-09 ENCOUNTER — Encounter (INDEPENDENT_AMBULATORY_CARE_PROVIDER_SITE_OTHER): Payer: Self-pay

## 2020-04-16 ENCOUNTER — Encounter (INDEPENDENT_AMBULATORY_CARE_PROVIDER_SITE_OTHER): Payer: Self-pay

## 2020-04-23 ENCOUNTER — Encounter (INDEPENDENT_AMBULATORY_CARE_PROVIDER_SITE_OTHER): Payer: Self-pay

## 2020-04-25 ENCOUNTER — Other Ambulatory Visit: Payer: Self-pay | Admitting: Hematology and Oncology

## 2020-04-30 ENCOUNTER — Encounter (INDEPENDENT_AMBULATORY_CARE_PROVIDER_SITE_OTHER): Payer: Self-pay

## 2020-05-07 ENCOUNTER — Encounter (INDEPENDENT_AMBULATORY_CARE_PROVIDER_SITE_OTHER): Payer: Self-pay

## 2020-05-14 ENCOUNTER — Encounter (INDEPENDENT_AMBULATORY_CARE_PROVIDER_SITE_OTHER): Payer: Self-pay

## 2020-05-16 ENCOUNTER — Encounter: Payer: Self-pay | Admitting: Neurology

## 2020-05-16 ENCOUNTER — Ambulatory Visit: Payer: Medicaid Other | Admitting: Neurology

## 2020-05-16 ENCOUNTER — Other Ambulatory Visit: Payer: Self-pay

## 2020-05-16 VITALS — BP 130/85 | HR 73 | Ht 63.5 in | Wt 171.5 lb

## 2020-05-16 DIAGNOSIS — R0683 Snoring: Secondary | ICD-10-CM

## 2020-05-16 DIAGNOSIS — R351 Nocturia: Secondary | ICD-10-CM | POA: Diagnosis not present

## 2020-05-16 DIAGNOSIS — Z820 Family history of epilepsy and other diseases of the nervous system: Secondary | ICD-10-CM

## 2020-05-16 DIAGNOSIS — G4719 Other hypersomnia: Secondary | ICD-10-CM | POA: Diagnosis not present

## 2020-05-16 DIAGNOSIS — E663 Overweight: Secondary | ICD-10-CM | POA: Diagnosis not present

## 2020-05-16 DIAGNOSIS — Z789 Other specified health status: Secondary | ICD-10-CM

## 2020-05-16 NOTE — Progress Notes (Signed)
Subjective:    Patient ID: Elizabeth Bullock is a 54 y.o. female.  HPI     Star Age, MD, PhD [facility] [address], [address] [place], [place] [address]  Dear Elizabeth Bullock,   I saw your patient, Elizabeth Bullock, upon your kind request, in my sleep clinic today for initial consultation of her sleep disorder, in particular, concern for abnormal obstructive sleep apnea.  The patient is unaccompanied today.  As you know, Elizabeth Bullock is a 54 year old right-handed woman with an underlying medical history of reflux disease, mood disorder, including anxiety and depression, degenerative back disease, COPD, history of migraines, hyperlipidemia, hypertension, breast cancer status post lumpectomy on the left and radiation therapy, on tamoxifen, tremors and overweight state, who reports snoring and sleep disruption, difficulty maintaining sleep and significant nocturia.  I reviewed your office records.  She reports not a very set schedule for her sleep.  She lives with her parents and helps take care of them.  Her brother has sleep apnea and has a CPAP machine.  Her Epworth sleepiness score is 12 out of 24, fatigue severity score is 37 out of 63.  She goes to bed typically after midnight, as late as 3 or 4 AM.  She has nocturia about 3-4 times per average night.  Rise time varies.  She has restless leg symptoms which have improved on Mirapex.  She has a history of migraines but has no significant exacerbation lately and denies any recurrent morning headaches.  She does consume quite a bit of caffeine in the form of Dr. [name], about 6 cans/day, coffee, 1 cup in the morning and about 2 servings of tea during the day.  She does admit to drinking caffeinated beverages until later in the evening.  She has low back pain, right knee pain, she is in the process of quitting smoking and has reduced from about 3 packs/day to currently 1-1/2 packs/day.  She has smoked since age 74.  Her  father also smokes.  She has no TV in the bedroom.  She has 6 dogs in the household which belonged to her parents, she herself has 2 cats, and they also have a bird in the household.  She does not drink caffeine.  Weight has been stable.   Her Past Medical History Is Significant For: Past Medical History:  Diagnosis Date  . Anxiety   . Bipolar disorder ([place])   . Bronchitis, chronic ([place])   . COPD (chronic obstructive pulmonary disease) ([place])   . Degenerative disorder of bone    OF BACK  . Depression   . GERD (gastroesophageal reflux disease)   . Headache(784.0)    History of migraines,  takes propanolol for headaches  . High cholesterol   . Hypertension   . Personal history of radiation therapy   . Tobacco abuse     Her Past Surgical History Is Significant For: Past Surgical History:  Procedure Laterality Date  . ABDOMINAL HYSTERECTOMY    . ANKLE SURGERY Left    left ankle surgery as teenager per pt  . BREAST LUMPECTOMY Left 03/15/2019  . BREAST LUMPECTOMY WITH RADIOACTIVE SEED AND SENTINEL LYMPH NODE BIOPSY Left 03/15/2019   Procedure: LEFT BREAST LUMPECTOMY WITH RADIOACTIVE SEED AND AXILLARY SENTINEL LYMPH NODE BIOPSY;  Surgeon: [name], MD;  Location: [place];  Service: General;  Laterality: Left;  . CHOLECYSTECTOMY  03/13/2011   Procedure: LAPAROSCOPIC CHOLECYSTECTOMY;  Surgeon: Donato Heinz;  Location: [facility];  Service: General;  Laterality: N/A;    Her Family History Is Significant For: Family History  Problem Relation Age of Onset  . Hypertension Mother   . Diabetes Mother   . Asthma Mother   . Hypertension Father   . COPD Father   . Heart attack Father   . Hyperlipidemia Brother     Her Social History Is Significant For: Social History   Socioeconomic History  . Marital status: Divorced    Spouse name: Not on file  . Number of children: 0  . Years of education: Not on file  . Highest education level: 9th grade  Occupational History  . Not on file   Tobacco Use  . Smoking status: Current Every Day Smoker    Packs/day: 1.00    Years: 39.00    Pack years: 39.00    Types: Cigarettes  . Smokeless tobacco: Never Used  Vaping Use  . Vaping Use: Never used  Substance and Sexual Activity  . Alcohol use: No  . Drug use: Yes    Types: Marijuana    Comment: uses daily  . Sexual activity: Yes    Birth control/protection: Surgical  Other Topics Concern  . Not on file  Social History Narrative  . Not on file   Social Determinants of Health   Financial Resource Strain:   . Difficulty of Paying Living Expenses: Not on file  Food Insecurity:   . Worried About Charity fundraiser in the Last Year: Not on file  . Ran Out of Food in the Last Year: Not on file  Transportation Needs:   . Lack of Transportation (Medical): Not on file  . Lack of Transportation (Non-Medical): Not on file  Physical Activity:   . Days of Exercise per Week: Not on file  . Minutes of Exercise per Session: Not on file  Stress:   . Feeling of Stress : Not on file  Social Connections:   . Frequency of Communication with Friends and Family: Not on file  . Frequency of Social Gatherings with Friends and Family: Not on file  . Attends Religious Services: Not on file  . Active Member of Clubs or Organizations: Not on file  . Attends Archivist Meetings: Not on file  . Marital Status: Not on file    Her Allergies Are:  Allergies  Allergen Reactions  . Codeine Hives and Nausea And Vomiting  . Morphine And Related Nausea Only  . Penicillins Hives    Did it involve swelling of the face/tongue/throat, SOB, or low BP? No Did it involve sudden or severe rash/hives, skin peeling, or any reaction on the inside of your mouth or nose? No Did you need to seek medical attention at a hospital or doctor's office? No When did it last happen?10+ years If all above answers are "NO", may proceed with cephalosporin use.   :   Her Current Medications Are:   Outpatient Encounter Medications as of 05/16/2020  Medication Sig  . acetaminophen (TYLENOL) 500 MG tablet Take 1,000 mg by mouth 2 (two) times daily as needed for moderate pain or headache.  . anastrozole (ARIMIDEX) 1 MG tablet Take 1 mg by mouth daily.  . APPLE CIDER VINEGAR PO Take 1 tablet by mouth daily.  . Aspirin-Caffeine (BC FAST PAIN RELIEF PO) Take 1 packet by mouth daily as needed (headache).  Marland Kitchen atorvastatin (LIPITOR) 20 MG tablet Take 1 tablet (20 mg total) by mouth daily.  Marland Kitchen gabapentin (NEURONTIN) 100 MG capsule Take 100-300 mg  by mouth at bedtime.  . hydrOXYzine (ATARAX/VISTARIL) 25 MG tablet Take 25-50 mg by mouth at bedtime.  Marland Kitchen loratadine (CLARITIN) 10 MG tablet Take 10 mg by mouth daily as needed for allergies.   . Multiple Vitamin (MULTIVITAMIN WITH MINERALS) TABS tablet Take 1 tablet by mouth daily.  Marland Kitchen omeprazole (PRILOSEC) 20 MG capsule Take 1 capsule (20 mg total) by mouth daily.  . pramipexole (MIRAPEX) 0.5 MG tablet Take 0.5 mg by mouth at bedtime.  . propranolol (INDERAL) 20 MG tablet Take 20 mg by mouth 2 (two) times daily.  . tamoxifen (NOLVADEX) 20 MG tablet Take 1/2 (one-half) tablet by mouth once daily  . venlafaxine XR (EFFEXOR-XR) 150 MG 24 hr capsule Take 150 mg by mouth daily with breakfast.   No facility-administered encounter medications on file as of 05/16/2020.  :  Review of Systems:  Out of a complete 14 point review of systems, all are reviewed and negative with the exception of these symptoms as listed below: Review of Systems  Neurological:       Pt presents today to discuss her sleep. Pt has never had a sleep study but does endorse snoring.  Epworth Sleepiness Scale 0= would never doze 1= slight chance of dozing 2= moderate chance of dozing 3= high chance of dozing  Sitting and reading: 3 Watching TV: 2 Sitting inactive in a public place (ex. Theater or meeting): 3 As a passenger in a car for an hour without a break: 1 Lying down to rest  in the afternoon: 3 Sitting and talking to someone: 0 Sitting quietly after lunch (no alcohol): 0 In a car, while stopped in traffic: 0 Total: 12     Objective:  Neurological Exam  Physical Exam Physical Examination:   Vitals:   05/16/20 1119  BP: 130/85  Pulse: 73    General Examination: The patient is a very pleasant 54 y.o. female in no acute distress. She appears well-developed and well-nourished and well groomed.   HEENT: Normocephalic, atraumatic, pupils are equal, round and reactive to light, extraocular tracking is good without limitation to gaze excursion or nystagmus noted. Hearing is grossly intact. Face is symmetric with normal facial animation. Speech is clear with no dysarthria noted. There is no hypophonia. There is no lip, neck/head, jaw or voice tremor. Neck is supple with full range of passive and active motion. There are no carotid bruits on auscultation. Oropharynx exam reveals: moderate mouth dryness, edentulous state, she has dentures but does not always wear them.  She has moderate airway crowding secondary to small airway entry, uvula normal in size, tonsils smaller in size, Mallampati class II, neck circumference of 14-1/2 inches. Tongue protrudes centrally and palate elevates symmetrically.    Chest: Clear to auscultation without wheezing, rhonchi or crackles noted.  Heart: S1+S2+0, regular and normal without murmurs, rubs or gallops noted.   Abdomen: Soft, non-tender and non-distended with normal bowel sounds appreciated on auscultation.  Extremities: There is no pitting edema in the distal lower extremities bilaterally.   Skin: Warm and dry without trophic changes noted.   Musculoskeletal: exam reveals no obvious joint deformities, tenderness or joint swelling or erythema.  Reports right knee discomfort.  Neurologically:  Mental status: The patient is awake, alert and oriented in all 4 spheres. Her immediate and remote memory, attention, language  skills and fund of knowledge are appropriate. There is no evidence of aphasia, agnosia, apraxia or anomia. Speech is clear with normal prosody and enunciation. Thought process is linear. Mood  is normal and affect is normal.  Cranial nerves II - XII are as described above under HEENT exam.  Motor exam: Normal bulk, strength and tone is noted. There is a slight bilateral upper extremity postural tremor, no resting tremor. Fine motor skills and coordination: grossly intact.  Cerebellar testing: No dysmetria or intention tremor. There is no truncal or gait ataxia.  Sensory exam: intact to light touch in the upper and lower extremities.  Gait, station and balance: She stands easily. No veering to one side is noted. No leaning to one side is noted. Posture is age-appropriate and stance is narrow based. Gait shows normal stride length and normal pace.  Slight limp noted on the right.  Assessment and Plan:  In summary, Taneil Lazarus is a very pleasant 54 y.o.-year old female with an underlying medical history of reflux disease, mood disorder, including anxiety and depression, degenerative back disease, COPD, history of migraines, hyperlipidemia, hypertension, breast cancer status post lumpectomy on the left and radiation therapy, on tamoxifen, tremors and overweight state, whose history and physical exam concerning for obstructive sleep apnea (OSA). I had a long chat with the patient about my findings and the diagnosis of OSA, its prognosis and treatment options. We talked about medical treatments, surgical interventions and non-pharmacological approaches. I explained in particular the risks and ramifications of untreated moderate to severe OSA, especially with respect to developing cardiovascular disease down the Road, including congestive heart failure, difficult to treat hypertension, cardiac arrhythmias, or stroke. Even type 2 diabetes has, in part, been linked to untreated OSA. Symptoms of untreated OSA  include daytime sleepiness, memory problems, mood irritability and mood disorder such as depression and anxiety, lack of energy, as well as recurrent headaches, especially morning headaches. We talked about smoking cessation and trying to maintain a  healthy lifestyle in general, as well as the importance of weight control. We also talked about the importance of good sleep hygiene.  She is advised to reduce her caffeine intake, it may be a contributor to her sleep disruption including delay in sleep onset at night.  She is also advised that caffeine is often also a trigger for tremors.  She is consuming quite a bit of caffeine over the course of the day.  She admits that she hardly drinks any water.  She is encouraged to increase her water intake. I recommended the following at this time: sleep study.   I explained the sleep test procedure to the patient and also outlined possible surgical and non-surgical treatment options of OSA, including the use of a positive airway pressure device such as CPAP or AutoPap.   We will pick up our discussion after testing.  We will notify her about her test results by phone call.  She would be willing to try CPAP therapy.  I plan to see her back after testing.  I answered all her questions today and she was in agreement.    Thank you very much for allowing me to participate in the care of this nice patient. If I can be of any further assistance to you please do not hesitate to call me at [phone].  Sincerely,   Star Age, MD, PhD

## 2020-05-16 NOTE — Patient Instructions (Signed)

## 2020-05-21 ENCOUNTER — Encounter (INDEPENDENT_AMBULATORY_CARE_PROVIDER_SITE_OTHER): Payer: Self-pay

## 2020-05-22 ENCOUNTER — Encounter: Payer: Self-pay | Admitting: *Deleted

## 2020-05-22 ENCOUNTER — Other Ambulatory Visit: Payer: Self-pay | Admitting: *Deleted

## 2020-05-28 ENCOUNTER — Encounter (INDEPENDENT_AMBULATORY_CARE_PROVIDER_SITE_OTHER): Payer: Self-pay

## 2020-05-29 ENCOUNTER — Encounter: Payer: Self-pay | Admitting: Neurology

## 2020-05-29 ENCOUNTER — Ambulatory Visit: Payer: Medicaid Other | Admitting: Neurology

## 2020-05-29 VITALS — BP 110/82 | HR 80 | Ht 63.5 in | Wt 173.0 lb

## 2020-05-29 DIAGNOSIS — R251 Tremor, unspecified: Secondary | ICD-10-CM

## 2020-05-29 DIAGNOSIS — Z789 Other specified health status: Secondary | ICD-10-CM

## 2020-05-29 NOTE — Patient Instructions (Signed)
You have a rather mild tremor of both hands.  I do not see any signs or symptoms of parkinson's like disease or what we call parkinsonism.   For your tremor, I would not recommend any new medication for fear of side effects (especially sleepiness) or medication interactions, especially in light of your current list of several medications.   Your insurance requires 15 business days for an authorization, we should have an answer about your sleep study request by this week or next week.  Please remember, that any kind of tremor may be exacerbated by anxiety, anger, nervousness, excitement, thyroid disease, dehydration, sleep deprivation, by caffeine, and low blood sugar values or blood sugar fluctuations. Some medications can exacerbate tremors, this includes antidepressant medication such as Effexor.  I would like for you to work on reducing your caffeine intake as you drink about 15 servings per day.  Please gradually reduce to try to limit yourself to 2-3 servings per day and increase your water intake to 6 to 8 cups of water per day, 8 ounce each.  I plan to see you back after you sleep study.

## 2020-05-29 NOTE — Progress Notes (Signed)
Subjective:    Patient ID: Elizabeth Bullock is a 54 y.o. female.  HPI     Interim history:   Elizabeth Bullock is a 54 year old left-handed woman with an underlying medical history of reflux disease, mood disorder, including anxiety and depression, degenerative back disease, COPD, history of migraines, hyperlipidemia, hypertension, breast cancer status post lumpectomy on the left and radiation therapy, on tamoxifen, tremors and overweight state, who presents for a new problem visit for hand tremors.  She is referred by her primary care PA, Donaciano Eva.  I recently evaluated the patient for concern for sleep apnea.  Sleep testing is pending.  Today, 05/29/2020: She reports an approximately 2 to 3-month history of hand tremors.  She has noticed it more on the right but both hands shake intermittently.  She has had some balance issues.  She fell recently when she was going out her home and fell down steps that go into her home.  She reports that she missed a step.  She did not seek medical attention, was able to get back up, did not have head injury or loss of consciousness.  She does take several medications including gabapentin 300 mg 3 times daily.  She also takes Effexor 150 mg once daily long-acting.  She reports that both medications are for her menopausal symptoms.  She does not sleep very well, sometimes she does not go to bed until early morning.  She does drink quite a bit of caffeine, estimates that between coffee, soda and tea, she drinks about 15 servings per day.  She drinks water about 16 ounces per day she estimates.  She reports a family history of Parkinson's disease.  Previously:   05/16/20: (She) reports snoring and sleep disruption, difficulty maintaining sleep and significant nocturia.  I reviewed your office records.  She reports not a very set schedule for her sleep.  She lives with her parents and helps take care of them.  Her brother has sleep apnea and has a CPAP machine.  Her  Epworth sleepiness score is 12 out of 24, fatigue severity score is 37 out of 63.  She goes to bed typically after midnight, as late as 3 or 4 AM.  She has nocturia about 3-4 times per average night.  Rise time varies.  She has restless leg symptoms which have improved on Mirapex.  She has a history of migraines but has no significant exacerbation lately and denies any recurrent morning headaches.  She does consume quite a bit of caffeine in the form of Dr. [name], about 6 cans/day, coffee, 1 cup in the morning and about 2 servings of tea during the day.  She does admit to drinking caffeinated beverages until later in the evening.  She has low back pain, right knee pain, she is in the process of quitting smoking and has reduced from about 3 packs/day to currently 1-1/2 packs/day.  She has smoked since age 18.  Her father also smokes.  She has no TV in the bedroom.  She has 6 dogs in the household which belonged to her parents, she herself has 2 cats, and they also have a bird in the household.  She does not drink caffeine.  Weight has been stable.     Her Past Medical History Is Significant For: Past Medical History:  Diagnosis Date  . Anxiety   . Bipolar disorder ([place])   . Breast cancer ([place])    radiation  . Bronchitis, chronic ([place])   . COPD (chronic  obstructive pulmonary disease) ([place])   . DDD (degenerative disc disease), cervical   . DDD (degenerative disc disease), lumbar   . Degenerative disorder of bone    OF BACK  . Depression   . GERD (gastroesophageal reflux disease)   . Headache(784.0)    History of migraines,  takes propanolol for headaches  . High cholesterol   . Hypertension   . Neuropathy    h/o  . Personal history of radiation therapy   . RLS (restless legs syndrome)   . Tobacco abuse   . Tremor     Her Past Surgical History Is Significant For: Past Surgical History:  Procedure Laterality Date  . ABDOMINAL HYSTERECTOMY    . ANKLE SURGERY Left    left ankle surgery  as teenager per pt  . BREAST LUMPECTOMY Left 03/15/2019  . BREAST LUMPECTOMY WITH RADIOACTIVE SEED AND SENTINEL LYMPH NODE BIOPSY Left 03/15/2019   Procedure: LEFT BREAST LUMPECTOMY WITH RADIOACTIVE SEED AND AXILLARY SENTINEL LYMPH NODE BIOPSY;  Surgeon: [name], MD;  Location: [facility];  Service: General;  Laterality: Left;  . CHOLECYSTECTOMY  03/13/2011   Procedure: LAPAROSCOPIC CHOLECYSTECTOMY;  Surgeon: Donato Heinz;  Location: [facility];  Service: General;  Laterality: N/A;    Her Family History Is Significant For: Family History  Problem Relation Age of Onset  . Hypertension Mother   . Diabetes Mother   . Asthma Mother   . Hypertension Father   . COPD Father   . Heart attack Father   . Hyperlipidemia Brother     Her Social History Is Significant For: Social History   Socioeconomic History  . Marital status: Divorced    Spouse name: Not on file  . Number of children: 0  . Years of education: Not on file  . Highest education level: 9th grade  Occupational History  . Not on file  Tobacco Use  . Smoking status: Current Every Day Smoker    Packs/day: 1.00    Years: 39.00    Pack years: 39.00    Types: Cigarettes  . Smokeless tobacco: Never Used  Vaping Use  . Vaping Use: Never used  Substance and Sexual Activity  . Alcohol use: No  . Drug use: Yes    Types: Marijuana    Comment: uses daily  . Sexual activity: Yes    Birth control/protection: Surgical  Other Topics Concern  . Not on file  Social History Narrative  . Not on file   Social Determinants of Health   Financial Resource Strain:   . Difficulty of Paying Living Expenses: Not on file  Food Insecurity:   . Worried About Charity fundraiser in the Last Year: Not on file  . Ran Out of Food in the Last Year: Not on file  Transportation Needs:   . Lack of Transportation (Medical): Not on file  . Lack of Transportation (Non-Medical): Not on file  Physical Activity:   . Days of Exercise per Week: Not on  file  . Minutes of Exercise per Session: Not on file  Stress:   . Feeling of Stress : Not on file  Social Connections:   . Frequency of Communication with Friends and Family: Not on file  . Frequency of Social Gatherings with Friends and Family: Not on file  . Attends Religious Services: Not on file  . Active Member of Clubs or Organizations: Not on file  . Attends Archivist Meetings: Not on file  . Marital Status: Not on  file    Her Allergies Are:  Allergies  Allergen Reactions  . Codeine Hives and Nausea And Vomiting  . Morphine And Related Nausea Only  . Penicillins Hives    Did it involve swelling of the face/tongue/throat, SOB, or low BP? No Did it involve sudden or severe rash/hives, skin peeling, or any reaction on the inside of your mouth or nose? No Did you need to seek medical attention at a hospital or doctor's office? No When did it last happen?10+ years If all above answers are "NO", may proceed with cephalosporin use.   :   Her Current Medications Are:  Outpatient Encounter Medications as of 05/29/2020  Medication Sig  . acetaminophen (TYLENOL) 500 MG tablet Take 1,000 mg by mouth 2 (two) times daily as needed for moderate pain or headache.  . anastrozole (ARIMIDEX) 1 MG tablet Take 1 mg by mouth daily.  . APPLE CIDER VINEGAR PO Take 1 tablet by mouth daily.  . Aspirin-Caffeine (BC FAST PAIN RELIEF PO) Take 1 packet by mouth daily as needed (headache).  Marland Kitchen atorvastatin (LIPITOR) 20 MG tablet Take 1 tablet (20 mg total) by mouth daily.  Marland Kitchen gabapentin (NEURONTIN) 300 MG capsule Take 300 mg by mouth 3 (three) times daily.  . hydrOXYzine (ATARAX/VISTARIL) 25 MG tablet Take 25-50 mg by mouth at bedtime.  Marland Kitchen loratadine (CLARITIN) 10 MG tablet Take 10 mg by mouth daily as needed for allergies.   . Multiple Vitamin (MULTIVITAMIN WITH MINERALS) TABS tablet Take 1 tablet by mouth daily.  Marland Kitchen omeprazole (PRILOSEC) 20 MG capsule Take 1 capsule (20 mg total) by  mouth daily.  . pramipexole (MIRAPEX) 0.5 MG tablet Take 0.5 mg by mouth at bedtime.  . propranolol (INDERAL) 20 MG tablet Take 20 mg by mouth 2 (two) times daily.  . tamoxifen (NOLVADEX) 20 MG tablet Take 1/2 (one-half) tablet by mouth once daily  . venlafaxine XR (EFFEXOR-XR) 150 MG 24 hr capsule Take 150 mg by mouth daily with breakfast.   No facility-administered encounter medications on file as of 05/29/2020.  :  Review of Systems:  Out of a complete 14 point review of systems, all are reviewed and negative with the exception of these symptoms as listed below: Review of Systems  Neurological:       Pt presents today to discuss her tremors. Pt is left handed. Pt notices the tremors mostly in her right hand.    Objective:  Neurological Exam  Physical Exam Physical Examination:   Vitals:   05/29/20 1006  BP: 110/82  Pulse: 80    General Examination: The patient is a very pleasant 54 y.o. female in no acute distress. She appears well-developed and well-nourished and well groomed.   HEENT: Normocephalic, atraumatic, pupils are equal, round and reactive to light, extraocular tracking is good without limitation to gaze excursion or nystagmus noted. Hearing is grossly intact. Face is symmetric with normal facial animation. Speech is clear with no dysarthria noted. There is no hypophonia. There is no lip, neck/head, jaw or voice tremor. Neck is supple with full range of passive and active motion. There are no carotid bruits on auscultation. Oropharynx exam reveals: moderate mouth dryness, edentulous state and moderate airway crowding. Tongue protrudes centrally and palate elevates symmetrically.    Chest: Clear to auscultation without wheezing, rhonchi or crackles noted.  Heart: S1+S2+0, regular and normal without murmurs, rubs or gallops noted.   Abdomen: Soft, non-tender and non-distended with normal bowel sounds appreciated on auscultation.  Extremities: There is  no pitting  edema in the distal lower extremities bilaterally.   Skin: Warm and dry without trophic changes noted.   Musculoskeletal: exam reveals no obvious joint deformities, tenderness or joint swelling or erythema.   Neurologically:  Mental status: The patient is awake, alert and oriented in all 4 spheres. Her immediate and remote memory, attention, language skills and fund of knowledge are appropriate. There is no evidence of aphasia, agnosia, apraxia or anomia. Speech is clear with normal prosody and enunciation. Thought process is linear. Mood is normal and affect is normal.  Cranial nerves II - XII are as described above under HEENT exam.  Motor exam: Normal bulk, strength and tone is noted. There is a slight bilateral upper extremity postural tremor, no resting tremor.   On 05/29/2020: On Archimedes spiral drawing she has no significant trembling, handwriting with her left hand is legible, not tremulous, not micrographic. She has no action tremor or intention tremor or resting tremor, no lower extremity tremors noted.  Fine motor skills and coordination: Normal finger taps, normal foot taps, normal hand movements, normal rapid alternating patting with her upper extremities.  Cerebellar testing: No dysmetria or intention tremor. There is no truncal or gait ataxia.  Finger-to-nose and heel-to-shin unremarkable bilaterally. Reflexes are 2+ bilaterally, toes are downgoing bilaterally. Sensory exam: intact to light touch in the upper and lower extremities.  Gait, station and balance: She stands easily. No veering to one side is noted. No leaning to one side is noted. Posture is age-appropriate and stance is narrow based. Gait shows normal stride length and normal pace.  Romberg is negative, tandem walk is slightly challenging for her but doable.  Assessment and Plan:  In summary, Elizabeth Bullock is a very pleasant 54 y.o.-year old female with an underlying medical history of reflux disease, mood  disorder, including anxiety and depression, degenerative back disease, COPD, history of migraines, hyperlipidemia, hypertension, breast cancer status post lumpectomy on the left and radiation therapy, on tamoxifen, tremors and overweight state, who presents for evaluation of her tremors.  She has a 2 to 62-month history of tremors.  She has a rather mild bilateral upper extremity postural tremor on examination.  She has no resting tremor, no signs of parkinsonism.  She is reassured in that regard.  Tremor may be secondary to medication effect, also exacerbated by increased caffeine intake.  I did not suggest any new medication from my end of things today.  She has been on propranolol 20 mg twice daily with modest improvement as far as her tremor goes.  She is advised that Effexor can sometimes exacerbate tremors or induce tremors.  She reports that she has been on it for about a year.  She also reports drinking quite a bit of caffeine on a day-to-day basis, about 15 servings by her estimation.  She is advised to reduce her caffeine intake and increase her water intake, and talk to her primary care about adjusting the Effexor dose potentially if feasible.  She is also advised regarding other tremor triggers.  She had blood work about 4 months ago which showed a normal TSH.  We also talked about sleep difficulty and sleep deprivation contributing to tremors.  She is in the process of being evaluated for possible underlying sleep apnea.  I had submitted a sleep study request, we are awaiting authorization for this and hopefully will be able to schedule her soon for her sleep study.  I plan to see her back after her sleep testing.  I answered all her questions today and she was in agreement. I spent 30 minutes in total face-to-face time and in reviewing records during pre-charting, more than 50% of which was spent in counseling and coordination of care, reviewing test results, reviewing medications and treatment regimen  and/or in discussing or reviewing the diagnosis of tremor, the prognosis and treatment options. Pertinent laboratory and imaging test results that were available during this visit with the patient were reviewed by me and considered in my medical decision making (see chart for details).

## 2020-06-07 ENCOUNTER — Telehealth: Payer: Self-pay

## 2020-06-07 NOTE — Telephone Encounter (Signed)
Calling to schedule sleep study. 

## 2020-06-20 ENCOUNTER — Ambulatory Visit (INDEPENDENT_AMBULATORY_CARE_PROVIDER_SITE_OTHER): Payer: Medicaid Other | Admitting: Neurology

## 2020-06-20 ENCOUNTER — Other Ambulatory Visit: Payer: Self-pay

## 2020-06-20 DIAGNOSIS — G4719 Other hypersomnia: Secondary | ICD-10-CM

## 2020-06-20 DIAGNOSIS — R0683 Snoring: Secondary | ICD-10-CM

## 2020-06-20 DIAGNOSIS — R351 Nocturia: Secondary | ICD-10-CM

## 2020-06-20 DIAGNOSIS — E663 Overweight: Secondary | ICD-10-CM

## 2020-06-20 DIAGNOSIS — Z789 Other specified health status: Secondary | ICD-10-CM

## 2020-06-20 DIAGNOSIS — Z820 Family history of epilepsy and other diseases of the nervous system: Secondary | ICD-10-CM

## 2020-06-20 DIAGNOSIS — G4720 Circadian rhythm sleep disorder, unspecified type: Secondary | ICD-10-CM

## 2020-06-27 ENCOUNTER — Other Ambulatory Visit: Payer: Self-pay | Admitting: Hematology and Oncology

## 2020-06-27 NOTE — Procedures (Signed)
PATIENT'S NAME:  Elizabeth Bullock, Elizabeth Bullock DOB:      Jul 25, 1965      MR#:    [id number]     DATE OF RECORDING: 06/20/2020 REFERRING M.D.:  Particia Nearing, PA-C Study Performed:   Baseline Polysomnogram HISTORY: 54 year old woman with a history of reflux disease, mood disorder, including anxiety and depression, degenerative back disease, COPD, history of migraines, hyperlipidemia, hypertension, breast cancer status post lumpectomy on the left and radiation therapy, on tamoxifen, tremors and borderline obesity, who reports snoring and sleep disruption, difficulty maintaining sleep and nocturia. The patient endorsed the Epworth Sleepiness Scale at 12 points. The patient's weight 173 pounds with a height of 64 (inches), resulting in a BMI of 30.1 kg/m2. The patient's neck circumference measured 12 inches.  CURRENT MEDICATIONS: Tylenol, Arimidex, BC Powders, Lipitor, Neurontin, Atarax, Claritin, Multivitamins w/minerals, Prilosec, Mirapex, Inderal, Nolvadex, Effexor XR   PROCEDURE:  This is a multichannel digital polysomnogram utilizing the Somnostar 11.2 system.  Electrodes and sensors were applied and monitored per AASM Specifications.   EEG, EOG, Chin and Limb EMG, were sampled at 200 Hz.  ECG, Snore and Nasal Pressure, Thermal Airflow, Respiratory Effort, CPAP Flow and Pressure, Oximetry was sampled at 50 Hz. Digital video and audio were recorded.      BASELINE STUDY  Lights Out was at 21:22 and Lights On at 04:20.  Total recording time (TRT) was 418 minutes, with a total sleep time (TST) of 389 minutes.   The patient's sleep latency was 4.5 minutes.  REM latency was 224.5 minutes, which is markedly delayed. The sleep efficiency was 93.1 %.     SLEEP ARCHITECTURE: WASO (Wake after sleep onset) was 24 minutes with minimal sleep fragmentation noted. There were 7 minutes in Stage N1, 313.5 minutes Stage N2, 26 minutes Stage N3 and 42.5 minutes in Stage REM.  The percentage of Stage N1 was 1.8%, Stage N2 was  80.6%, which is markedly increased, Stage N3 was 6.7% and Stage R (REM sleep) was 10.9%, which is reduced. The arousals were noted as: 34 were spontaneous, 0 were associated with PLMs, 9 were associated with respiratory events.  RESPIRATORY ANALYSIS:  There were a total of 14 respiratory events:  7 obstructive apneas, 1 central apneas and 3 mixed apneas with a total of 11 apneas and an apnea index (AI) of 1.7 /hour. There were 3 hypopneas with a hypopnea index of .5 /hour. The patient also had 0 respiratory event related arousals (RERAs).      The total APNEA/HYPOPNEA INDEX (AHI) was 2.2/hour and the total RESPIRATORY DISTURBANCE INDEX was  2.2 /hour.  0 events occurred in REM sleep and 10 events in NREM. The REM AHI was  0 /hour, versus a non-REM AHI of 2.4. The patient spent 116 minutes of total sleep time in the supine position and 273 minutes in non-supine.. The supine AHI was 5.7 versus a non-supine AHI of 0.6.  OXYGEN SATURATION & C02:  The Wake baseline 02 saturation was 95%, with the lowest being 83%. Time spent below 89% saturation equaled 10 minutes. PERIODIC LIMB MOVEMENTS: The patient had a total of 0 Periodic Limb Movements.  The Periodic Limb Movement (PLM) index was 0 and the PLM Arousal index was 0/hour.  Audio and video analysis did not show any abnormal or unusual movements, behaviors, phonations or vocalizations. The patient took 1 bathroom break. Intermittent mild to moderate snoring was noted. The EKG was in keeping with normal sinus rhythm (NSR).  Post-study, the patient indicated that sleep  was better than usual.   IMPRESSION:  1. Primary Snoring 2. Dysfunctions associated with sleep stages or arousal from sleep  RECOMMENDATIONS:  1. This study does not demonstrate any significant obstructive or central sleep disordered breathing with the exception of borderline sleep apnea while sleeping supine and intermittent snoring. Treatment with positive airway pressure is not  warranted.  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 2. This study shows little sleep fragmentation but abnormal sleep stage percentages; these are nonspecific findings and per se do not signify an intrinsic sleep disorder or a cause for the patient's sleep-related symptoms. Causes include (but are not limited to) the first night effect of the sleep study, circadian rhythm disturbances, medication effect or an underlying mood disorder or medical problem.  3. The patient should be cautioned not to drive, work at heights, or operate dangerous or heavy equipment when tired or sleepy. Review and reiteration of good sleep hygiene measures should be pursued with any patient. 4. The patient will be advised to follow up with the referring provider, who will be notified of the test results.  I certify that I have reviewed the entire raw data recording prior to the issuance of this report in accordance with the Standards of Accreditation of the American Academy of Sleep Medicine (AASM)   Huston Foley, MD, PhD Diplomat, American Board of Neurology and Sleep Medicine (Neurology and Sleep Medicine)

## 2020-06-27 NOTE — Progress Notes (Signed)
Patient referred by Prudy Feeler, PA, seen by me on 05/16/20, diagnostic PSG on 06/20/20.   Please call and notify the patient that the recent sleep study did not show any significant obstructive sleep apnea the exception of borderline sleep apnea while sleeping supine and intermittent snoring. Treatment with positive airway pressure is not warranted.  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  She did have some oxygen desaturations during sleep, mostly into the the higher and mid 80s. Smoking cessation and optimal treatment of her COPD are recommended.   At this juncture, she can FU with her primary care.   Please remind patient to try to maintain good sleep hygiene, which means: Keep a regular sleep and wake schedule and make enough time for sleep (7 1/2 to 8 1/2 hours for the average adult), try not to exercise or have a meal within 2 hours of your bedtime, try to keep your bedroom conducive for sleep, that is, cool and dark, without light distractors such as an illuminated alarm clock, and refrain from watching TV right before sleep or in the middle of the night and do not keep the TV or radio on during the night. If a nightlight is used, have it away from the visual field. Also, try not to use or play on electronic devices at bedtime, such as your cell phone, tablet PC or laptop. If you like to read at bedtime on an electronic device, try to dim the background light as much as possible. Do not eat in the middle of the night. Keep pets away from the bedroom environment. For stress relief, try meditation, deep breathing exercises (there are many books and CDs available), a white noise machine or fan can help to diffuse other noise distractors, such as traffic noise. Do not drink alcohol before bedtime, as it can disturb sleep and cause middle of the night awakenings. Never mix alcohol and sedating medications! Avoid narcotic pain  medication close to bedtime, as opioids/narcotics can suppress breathing drive and breathing effort.     Thanks,  Huston Foley, MD, PhD [facility])

## 2020-06-28 ENCOUNTER — Telehealth: Payer: Self-pay | Admitting: Neurology

## 2020-06-28 ENCOUNTER — Encounter: Payer: Self-pay | Admitting: Neurology

## 2020-06-28 NOTE — Telephone Encounter (Signed)
-----   Message from Huston Foley, MD sent at 06/27/2020  8:06 AM EST ----- Patient referred by Prudy Feeler, PA, seen by me on 05/16/20, diagnostic PSG on 06/20/20.   Please call and notify the patient that the recent sleep study did not show any significant obstructive sleep apnea the exception of borderline sleep apnea while sleeping supine and intermittent snoring. Treatment with positive airway pressure is not warranted.  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  She did have some oxygen desaturations during sleep, mostly into the the higher and mid 80s. Smoking cessation and optimal treatment of her COPD are recommended.   At this juncture, she can FU with her primary care.   Please remind patient to try to maintain good sleep hygiene, which means: Keep a regular sleep and wake schedule and make enough time for sleep (7 1/2 to 8 1/2 hours for the average adult), try not to exercise or have a meal within 2 hours of your bedtime, try to keep your bedroom conducive for sleep, that is, cool and dark, without light distractors such as an illuminated alarm clock, and refrain from watching TV right before sleep or in the middle of the night and do not keep the TV or radio on during the night. If a nightlight is used, have it away from the visual field. Also, try not to use or play on electronic devices at bedtime, such as your cell phone, tablet PC or laptop. If you like to read at bedtime on an electronic device, try to dim the background light as much as possible. Do not eat in the middle of the night. Keep pets away from the bedroom environment. For stress relief, try meditation, deep breathing exercises (there are many books and CDs available), a white noise machine or fan can help to diffuse other noise distractors, such as traffic noise. Do not drink alcohol before bedtime, as it can disturb sleep and cause middle of the night  awakenings. Never mix alcohol and sedating medications! Avoid narcotic pain medication close to bedtime, as opioids/narcotics can suppress breathing drive and breathing effort.     Thanks,  Huston Foley, MD, PhD [facility])

## 2020-06-28 NOTE — Telephone Encounter (Signed)
Called patient to discuss sleep study results. No answer at this time. LVM for the patient to call back.  Will send a mychart message as well. 

## 2020-06-30 ENCOUNTER — Other Ambulatory Visit: Payer: Self-pay

## 2020-06-30 DIAGNOSIS — I10 Essential (primary) hypertension: Secondary | ICD-10-CM | POA: Diagnosis not present

## 2020-06-30 DIAGNOSIS — F17210 Nicotine dependence, cigarettes, uncomplicated: Secondary | ICD-10-CM | POA: Insufficient documentation

## 2020-06-30 DIAGNOSIS — Z79899 Other long term (current) drug therapy: Secondary | ICD-10-CM | POA: Insufficient documentation

## 2020-06-30 DIAGNOSIS — Z923 Personal history of irradiation: Secondary | ICD-10-CM | POA: Insufficient documentation

## 2020-06-30 DIAGNOSIS — J449 Chronic obstructive pulmonary disease, unspecified: Secondary | ICD-10-CM | POA: Insufficient documentation

## 2020-06-30 DIAGNOSIS — Z853 Personal history of malignant neoplasm of breast: Secondary | ICD-10-CM | POA: Diagnosis not present

## 2020-06-30 DIAGNOSIS — R2242 Localized swelling, mass and lump, left lower limb: Secondary | ICD-10-CM | POA: Diagnosis not present

## 2020-07-01 ENCOUNTER — Encounter (HOSPITAL_COMMUNITY): Payer: Self-pay

## 2020-07-01 ENCOUNTER — Emergency Department (HOSPITAL_COMMUNITY)
Admit: 2020-07-01 | Discharge: 2020-07-01 | Disposition: A | Discharge status: Home / Self Care | Payer: Medicaid Other | Attending: Emergency Medicine | Admitting: Emergency Medicine

## 2020-07-01 ENCOUNTER — Emergency Department (HOSPITAL_COMMUNITY)
Admission: EM | Admit: 2020-07-01 | Discharge: 2020-07-01 | Disposition: A | Discharge status: Home / Self Care | Payer: Medicaid Other | Attending: Emergency Medicine | Admitting: Emergency Medicine

## 2020-07-01 DIAGNOSIS — M7989 Other specified soft tissue disorders: Secondary | ICD-10-CM

## 2020-07-01 MED ORDER — ENOXAPARIN SODIUM 80 MG/0.8ML ~~LOC~~ SOLN
1.0000 mg/kg | Freq: Once | SUBCUTANEOUS | Status: AC
  Administered 2020-07-01: 77.5 mg via SUBCUTANEOUS
  Filled 2020-07-01: qty 0.8

## 2020-07-01 NOTE — ED Provider Notes (Signed)
I was asked to review the patient's DVT ultrasound study results.  I personally reviewed his, was negative for DVT.  I discussed these findings with the patient who is overall reassured.  Encourage PCP follow-up.  Return precautions discussed.  Continues to have no chest pain or shortness of breath. No evidence of infection on LE.  She was understanding and is agreeable.  At this stage in the ED course, the patient is medically screened and stable for discharge   Mare Ferrari, PA-C 07/01/20 0354    Bethann Berkshire, MD 07/05/20 1026

## 2020-07-01 NOTE — ED Provider Notes (Signed)
[facility] EMERGENCY DEPARTMENT Provider Note   CSN: [id number] Arrival date & time: 06/30/20  2304     History Chief Complaint  Patient presents with  . Leg Swelling    Left    Elizabeth Bullock is a 55 y.o. female.  The history is provided by the patient and a parent.  Patient reports left leg pain for the past 4 days.  No trauma.  She reports pain and swelling and mild numbness distal to the knee.  She is able to ambulate.  No fevers or vomiting.  No chest pain/shortness of breath.  No previous history of VTE.  However she does take tamoxifen due to previous history of breast cancer. Her course is worsening.  Walking makes her symptoms worse     Past Medical History:  Diagnosis Date  . Anxiety   . Bipolar disorder (HCC)   . Breast cancer (HCC)    radiation  . Bronchitis, chronic (HCC)   . COPD (chronic obstructive pulmonary disease) (HCC)   . DDD (degenerative disc disease), cervical   . DDD (degenerative disc disease), lumbar   . Degenerative disorder of bone    OF BACK  . Depression   . GERD (gastroesophageal reflux disease)   . Headache(784.0)    History of migraines,  takes propanolol for headaches  . High cholesterol   . Hypertension   . Neuropathy    h/o  . Personal history of radiation therapy   . RLS (restless legs syndrome)   . Tobacco abuse   . Tremor     Patient Active Problem List   Diagnosis Date Noted  . Malignant neoplasm of upper-inner quadrant of left breast in female, estrogen receptor positive (HCC) 02/11/2019  . Screening breast examination 02/08/2019  . Nausea with vomiting 02/09/2015  . Pain in the chest   . Gastroenteritis   . Hypertension   . Tobacco abuse   . Chest pain 02/08/2015    Past Surgical History:  Procedure Laterality Date  . ABDOMINAL HYSTERECTOMY    . ANKLE SURGERY Left    left ankle surgery as teenager per pt  . BREAST LUMPECTOMY Left 03/15/2019  . BREAST LUMPECTOMY WITH RADIOACTIVE SEED AND SENTINEL LYMPH  NODE BIOPSY Left 03/15/2019   Procedure: LEFT BREAST LUMPECTOMY WITH RADIOACTIVE SEED AND AXILLARY SENTINEL LYMPH NODE BIOPSY;  Surgeon: Ovidio Kin, MD;  Location: [facility] OR;  Service: General;  Laterality: Left;  . CHOLECYSTECTOMY  03/13/2011   Procedure: LAPAROSCOPIC CHOLECYSTECTOMY;  Surgeon: Fabio Bering;  Location: [facility];  Service: General;  Laterality: N/A;     OB History    Gravida  1   Para      Term      Preterm      AB  1   Living  0     SAB  1   IAB      Ectopic      Multiple      Live Births  0           Family History  Problem Relation Age of Onset  . Hypertension Mother   . Diabetes Mother   . Asthma Mother   . Hypertension Father   . COPD Father   . Heart attack Father   . Hyperlipidemia Brother     Social History   Tobacco Use  . Smoking status: Current Every Day Smoker    Packs/day: 1.00    Years: 39.00    Pack years: 39.00  Types: Cigarettes  . Smokeless tobacco: Never Used  Vaping Use  . Vaping Use: Never used  Substance Use Topics  . Alcohol use: No  . Drug use: Yes    Types: Marijuana    Comment: uses daily    Home Medications Prior to Admission medications   Medication Sig Start Date End Date Taking? Authorizing Provider  acetaminophen (TYLENOL) 500 MG tablet Take 1,000 mg by mouth 2 (two) times daily as needed for moderate pain or headache.    [provider]  anastrozole (ARIMIDEX) 1 MG tablet Take 1 mg by mouth daily.    [provider]  APPLE CIDER VINEGAR PO Take 1 tablet by mouth daily.    [provider]  Aspirin-Caffeine (BC FAST PAIN RELIEF PO) Take 1 packet by mouth daily as needed (headache).    [provider]  atorvastatin (LIPITOR) 20 MG tablet Take 1 tablet (20 mg total) by mouth daily. 01/24/19   Soyla Dryer, PA-C  gabapentin (NEURONTIN) 300 MG capsule Take 300 mg by mouth 3 (three) times daily. 04/30/20   [provider]  hydrOXYzine (ATARAX/VISTARIL) 25  MG tablet Take 25-50 mg by mouth at bedtime.    [provider]  loratadine (CLARITIN) 10 MG tablet Take 10 mg by mouth daily as needed for allergies.     [provider]  Multiple Vitamin (MULTIVITAMIN WITH MINERALS) TABS tablet Take 1 tablet by mouth daily.    [provider]  omeprazole (PRILOSEC) 20 MG capsule Take 1 capsule (20 mg total) by mouth daily. 10/16/15   Rancour, Annie Main, MD  pramipexole (MIRAPEX) 0.5 MG tablet Take 0.5 mg by mouth at bedtime.    [provider]  propranolol (INDERAL) 20 MG tablet Take 20 mg by mouth 2 (two) times daily.    [provider]  tamoxifen (NOLVADEX) 20 MG tablet Take 1/2 (one-half) tablet by mouth once daily 06/28/20   [name], MD  venlafaxine XR (EFFEXOR-XR) 150 MG 24 hr capsule Take 150 mg by mouth daily with breakfast.    [provider]    Allergies    Codeine, Morphine and related, and Penicillins  Review of Systems   Review of Systems  Constitutional: Negative for fever.  Respiratory: Negative for shortness of breath.   Cardiovascular: Positive for leg swelling. Negative for chest pain.  Gastrointestinal: Negative for blood in stool.  Genitourinary: Negative for hematuria and vaginal bleeding.  Neurological: Positive for numbness. Negative for weakness.  All other systems reviewed and are negative.   Physical Exam Updated Vital Signs BP 121/74 (BP Location: Right Arm)   Pulse 70   Temp 98.1 F (36.7 C) (Oral)   Resp 19   Ht 1.6 m (5\' 3" )   Wt 78.5 kg   SpO2 94%   BMI 30.65 kg/m   Physical Exam CONSTITUTIONAL: Well developed/well nourished HEAD: Normocephalic/atraumatic EYES: EOMI NECK: supple no meningeal signs SPINE/BACK:entire spine nontender CV: S1/S2 noted, no murmurs/rubs/gallops noted LUNGS: Lungs are clear to auscultation bilaterally, no apparent distress ABDOMEN: soft, nontender NEURO: Pt is awake/alert/appropriate, moves all extremitiesx4.  No facial  droop.   EXTREMITIES: pulses normal/equal, full ROM, edema noted to bilateral lower extremities, left greater than right.  Distal pulses equal intact.  Mild calf tenderness to left.  No erythema or crepitus.  She can fully range both knees and ankles and the lower extremities. SKIN: warm, color normal PSYCH: no abnormalities of mood noted, alert and oriented to situation  ED Results / Procedures /  Treatments   Labs (all labs ordered are listed, but only abnormal results are displayed) Labs Reviewed - No data to display  EKG None  Radiology No results found.  Procedures Procedures Medications Ordered in ED Medications  enoxaparin (LOVENOX) injection 77.5 mg (77.5 mg Subcutaneous Given 07/01/20 0119)    ED Course  I have reviewed the triage vital signs and the nursing notes.      MDM Rules/Calculators/A&P                          Patient with risk factors for DVT.  She will be given a one-time dose of Lovenox and she will return later in the morning at 9 AM for an outpatient DVT study.  She denies any chest pain or shortness of breath.  She reports that she has had no bleeding tendencies recently. Final Clinical Impression(s) / ED Diagnoses Final diagnoses:  Leg swelling    Rx / DC Orders ED Discharge Orders         Ordered    US Venous Img Lower Unilateral Left        07/01/20 0108           Zadie Rhine, MD 07/01/20 [phone]

## 2020-07-01 NOTE — Discharge Instructions (Signed)
You can return at 9 AM to get the ultrasound of your leg

## 2020-07-01 NOTE — ED Triage Notes (Signed)
Pt c/o leg swelling for a couple days. Left leg from the knee down. States she is on a medication that causes blood clots and wants it checked out. 6/10 pain.

## 2020-07-18 DIAGNOSIS — R609 Edema, unspecified: Secondary | ICD-10-CM | POA: Insufficient documentation

## 2020-07-18 DIAGNOSIS — R61 Generalized hyperhidrosis: Secondary | ICD-10-CM | POA: Insufficient documentation

## 2020-07-23 DIAGNOSIS — M5136 Other intervertebral disc degeneration, lumbar region with discogenic back pain only: Secondary | ICD-10-CM | POA: Insufficient documentation

## 2020-07-23 DIAGNOSIS — J449 Chronic obstructive pulmonary disease, unspecified: Secondary | ICD-10-CM | POA: Insufficient documentation

## 2020-08-09 ENCOUNTER — Telehealth: Payer: Self-pay | Admitting: Adult Health

## 2020-08-09 NOTE — Telephone Encounter (Signed)
Rescheduled appts per LC schedule change. Left voicemail with cancellation details and new appt date and time.

## 2020-08-15 ENCOUNTER — Inpatient Hospital Stay: Payer: Medicaid Other | Admitting: Adult Health

## 2020-08-22 NOTE — Progress Notes (Signed)
[facility] Follow up:    Elizabeth Sleeper, PA-C [place] [place] [id number]   DIAGNOSIS: Cancer Staging Malignant neoplasm of upper-inner quadrant of left breast in female, estrogen receptor positive ([place]) Staging form: Breast, AJCC 8th Edition - Clinical stage from 02/16/2019: Stage IA (cT1a, cN0, cM0, G2, ER+, PR+, HER2-) - Signed by [name], MD on 02/16/2019 Stage prefix: Initial diagnosis Histologic grading system: 3 grade system Laterality: Left Staged by: Pathologist and managing physician Stage used in treatment planning: Yes National guidelines used in treatment planning: Yes Type of national guideline used in treatment planning: NCCN - Pathologic stage from 03/15/2019: Stage IA (pT1a, pN0, cM0, G1, ER+, PR+, HER2-) - Signed by Gardenia Phlegm, NP on 07/18/2019 Stage prefix: Initial diagnosis Histologic grading system: 3 grade system   SUMMARY OF ONCOLOGIC HISTORY: Oncology History  Malignant neoplasm of upper-inner quadrant of left breast in female, estrogen receptor positive ([place])  02/11/2019 Initial Diagnosis   Routine screening mammogram detected a 42m mass with associated architectural distortion in the upper left breast, no axillary adenopathy. 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   02/16/2019 Cancer Staging   Staging form: Breast, AJCC 8th Edition - Clinical stage from 02/16/2019: Stage IA (cT1a, cN0, cM0, G2, ER+, PR+, HER2-) - Signed by GNicholas Lose MD on 02/16/2019   03/15/2019 Surgery   Left lumpectomy ([name]: IDC with DCIS, 0.3cm, grade 1, clear margins. ER positive, PR positive, HER-2 negative. 4 left axillary lymph nodes negative.   03/15/2019 Cancer Staging   Staging form: Breast, AJCC 8th Edition - Pathologic stage from 03/15/2019: Stage IA (pT1a, pN0, cM0, G1, ER+, PR+, HER2-) - Signed by CGardenia Phlegm NP on 07/18/2019   04/12/2019 -  05/10/2019 Radiation Therapy   The patient initially received a dose of 40.05 Gy in 15 fractions to the breast using whole-breast tangent fields. This was delivered using a 3-D conformal technique. The pt received a boost delivering an additional 12 Gy in 6 fractions using a electron boost with 160m electrons. The total dose was 52.05 Gy.   05/2019 - 05/2024 Anti-estrogen oral therapy   Anastrozole     CURRENT THERAPY: Tamoxifen daily  INTERVAL HISTORY: Elizabeth Bullock.o. female returns for evaluation and f/u of estrogen positive breast cancer.  She underwent mammogram on 01/13/2020 and it showed no evidence of malignancy, 3 small oil cysts, and recommended repeat mammogram in 1 years time.    She was having increased hot flashes, and notes that they are improved with the combination of Gabapentin and Effexor.  She is tolerating these well.  She is taking Tamoxifen daily.  She denies any vaginal discharge, joint aches/pains, swelling.  She has noted an increase in pelvic pain.    She notes that she drinks coffee, tea, and soda a majority of the day and about 16 ounces of water.  [name] to use tobacco products.  She is smoking about 1ppd, and notes she has cut down from 2ppd.  She smoked 2 ppd for 15 years.   She notes that she exercises by walking up and down her road, and walking through [employer].   Her most recent bone density exam was completed on 09/06/2019 and was normal.     Patient Active Problem List   Diagnosis Date Noted  . Malignant neoplasm of upper-inner quadrant of left breast in female, estrogen receptor positive ([place]/14/[place]  .  Screening breast examination 02/08/2019  . Nausea with vomiting 02/09/2015  . Pain in the chest   . Gastroenteritis   . Hypertension   . Tobacco abuse   . Chest pain 02/08/2015    is allergic to codeine, morphine and related, and penicillins.  MEDICAL HISTORY: Past Medical History:  Diagnosis Date  . Anxiety   . Bipolar  disorder ([place])   . Breast cancer ([place])    radiation  . Bronchitis, chronic ([place])   . COPD (chronic obstructive pulmonary disease) ([place])   . DDD (degenerative disc disease), cervical   . DDD (degenerative disc disease), lumbar   . Degenerative disorder of bone    OF BACK  . Depression   . GERD (gastroesophageal reflux disease)   . Headache(784.0)    History of migraines,  takes propanolol for headaches  . High cholesterol   . Hypertension   . Neuropathy    h/o  . Personal history of radiation therapy   . RLS (restless legs syndrome)   . Tobacco abuse   . Tremor     SURGICAL HISTORY: Past Surgical History:  Procedure Laterality Date  . ABDOMINAL HYSTERECTOMY    . ANKLE SURGERY Left    left ankle surgery as teenager per pt  . BREAST LUMPECTOMY Left 03/15/2019  . BREAST LUMPECTOMY WITH RADIOACTIVE SEED AND SENTINEL LYMPH NODE BIOPSY Left 03/15/2019   Procedure: LEFT BREAST LUMPECTOMY WITH RADIOACTIVE SEED AND AXILLARY SENTINEL LYMPH NODE BIOPSY;  Surgeon: [name], MD;  Location: [facility];  Service: General;  Laterality: Left;  . CHOLECYSTECTOMY  03/13/2011   Procedure: LAPAROSCOPIC CHOLECYSTECTOMY;  Surgeon: Donato Heinz;  Location: [facility];  Service: General;  Laterality: N/A;    SOCIAL HISTORY: Social History   Socioeconomic History  . Marital status: Divorced    Spouse name: Not on file  . Number of children: 0  . Years of education: Not on file  . Highest education level: 9th grade  Occupational History  . Not on file  Tobacco Use  . Smoking status: Current Every Day Smoker    Packs/day: 1.00    Years: 39.00    Pack years: 39.00    Types: Cigarettes  . Smokeless tobacco: Never Used  Vaping Use  . Vaping Use: Never used  Substance and Sexual Activity  . Alcohol use: No  . Drug use: Yes    Types: Marijuana    Comment: uses daily  . Sexual activity: Yes    Birth control/protection: Surgical  Other Topics Concern  . Not on file  Social History Narrative   . Not on file   Social Determinants of Health   Financial Resource Strain: Not on file  Food Insecurity: Not on file  Transportation Needs: Not on file  Physical Activity: Not on file  Stress: Not on file  Social Connections: Not on file  Intimate Partner Violence: Not on file    FAMILY HISTORY: Family History  Problem Relation Age of Onset  . Hypertension Mother   . Diabetes Mother   . Asthma Mother   . Hypertension Father   . COPD Father   . Heart attack Father   . Hyperlipidemia Brother     Review of Systems  Constitutional: Negative for appetite change, chills, fatigue, fever and unexpected weight change.  HENT:   Negative for hearing loss, lump/mass and trouble swallowing.   Eyes: Negative for eye problems and icterus.  Respiratory: Negative for chest tightness, cough and shortness of breath.   Cardiovascular:  Negative for chest pain, leg swelling and palpitations.  Gastrointestinal: Negative for abdominal distention, abdominal pain, constipation, diarrhea, nausea and vomiting.  Endocrine: Negative for hot flashes.  Genitourinary: Positive for pelvic pain. Negative for difficulty urinating, dysuria, frequency, hematuria, vaginal bleeding and vaginal discharge.   Musculoskeletal: Negative for arthralgias.  Skin: Negative for itching and rash.  Neurological: Negative for dizziness, extremity weakness, headaches and numbness.  Hematological: Negative for adenopathy. Does not bruise/bleed easily.  Psychiatric/Behavioral: Negative for depression. The patient is not nervous/anxious.       PHYSICAL EXAMINATION  ECOG PERFORMANCE STATUS: 1 - Symptomatic but completely ambulatory  Vitals:   08/23/20 1033  BP: 126/85  Pulse: 74  Resp: 17  Temp: 97.7 F (36.5 C)  SpO2: 98%    Physical Exam Constitutional:      General: She is not in acute distress.    Appearance: Normal appearance. She is not toxic-appearing.  HENT:     Head: Normocephalic and atraumatic.   Eyes:     General: No scleral icterus. Cardiovascular:     Rate and Rhythm: Normal rate and regular rhythm.     Pulses: Normal pulses.     Heart sounds: Normal heart sounds.  Pulmonary:     Effort: Pulmonary effort is normal.     Breath sounds: Normal breath sounds.     Comments: Right breast benign, left breast s/p lumpectomy and radiation, no sign of local recurrence. Abdominal:     General: Abdomen is flat. Bowel sounds are normal. There is no distension.     Palpations: Abdomen is soft.     Tenderness: There is no abdominal tenderness.  Musculoskeletal:        General: No swelling.     Cervical back: Neck supple.  Lymphadenopathy:     Cervical: No cervical adenopathy.  Skin:    General: Skin is warm and dry.     Findings: No rash.  Neurological:     General: No focal deficit present.     Mental Status: She is alert.  Psychiatric:        Mood and Affect: Mood normal.        Behavior: Behavior normal.     LABORATORY DATA:  CBC    Component Value Date/Time   WBC 9.7 03/09/2019 0939   RBC 5.28 (H) 03/09/2019 0939   HGB 15.7 (H) 03/09/2019 0939   HGB 14.3 02/16/2019 0837   HCT 47.2 (H) 03/09/2019 0939   PLT 260 03/09/2019 0939   PLT 251 02/16/2019 0837   MCV 89.4 03/09/2019 0939   MCH 29.7 03/09/2019 0939   MCHC 33.3 03/09/2019 0939   RDW 12.6 03/09/2019 0939   LYMPHSABS 2.0 02/16/2019 0837   MONOABS 0.6 02/16/2019 0837   EOSABS 0.1 02/16/2019 0837   BASOSABS 0.0 02/16/2019 0837    CMP     Component Value Date/Time   NA 139 03/09/2019 0939   K 3.8 03/09/2019 0939   CL 104 03/09/2019 0939   CO2 25 03/09/2019 0939   GLUCOSE 81 03/09/2019 0939   BUN 6 03/09/2019 0939   CREATININE 0.86 03/09/2019 0939   CREATININE 0.85 02/16/2019 0837   CALCIUM 9.4 03/09/2019 0939   PROT 7.0 03/09/2019 0939   ALBUMIN 4.2 03/09/2019 0939   AST 20 03/09/2019 0939   AST 14 (L) 02/16/2019 0837   ALT 19 03/09/2019 0939   ALT 15 02/16/2019 0837   ALKPHOS 96 03/09/2019  0939   BILITOT 0.7 03/09/2019 0939   BILITOT 0.7 02/16/2019  [place] >60 03/09/2019 0939   GFRNONAA >60 02/16/2019 0837   GFRAA >60 03/09/2019 0939   GFRAA >60 02/16/2019 0837     RADIOGRAPHIC STUDIES:  CLINICAL DATA:  Status post left lumpectomy and radiation therapy for breast cancer 2020. Follow-up left axillary probable fat necrosis.  EXAM: DIGITAL DIAGNOSTIC BILATERAL MAMMOGRAM WITH CAD AND TOMO  ULTRASOUND LEFT BREAST  COMPARISON:  Previous exam(s).  ACR Breast Density Category c: The breast tissue is heterogeneously dense, which may obscure small masses.  FINDINGS: Stable post lumpectomy and postradiation changes on the left. There are 3 interval adjacent small, rounded oil cysts with wall calcification at the location of the previously suspected fat necrosis in the inferior left axilla/axillary tail of the breast. No interval findings suspicious for malignancy in either breast.  Mammographic images were processed with CAD.  On physical exam, there is focal tenderness and mild thickening at the location of the patient's left axillary surgical scar, corresponding to the area of patient concern.  Targeted ultrasound is performed, showing a 3 x 3 x 3 mm rounded, hypoechoic mass with an eccentric fatty hilum in the axillary tail region of the left breast without the adjacent ill-defined surrounding increased echogenicity seen on 09/06/2019. The hypoechoic component measured 3 x 3 x 3 mm previously.  IMPRESSION: 1. No evidence of malignancy in either breast. 2. Stable 3 mm probable normal intramammary lymph node in the axillary tail region of the left breast at the location of the patient's surgical scar. A small area of fat necrosis less likely based on the current appearance. 3. Interval 3 small, rounded oil cysts with wall calcifications in the left axilla.  RECOMMENDATION: Bilateral diagnostic mammogram in 1 year.  I have discussed the  findings and recommendations with the patient. If applicable, a reminder letter will be sent to the patient regarding the next appointment.  BI-RADS CATEGORY  2: Benign.   Electronically Signed   By: Claudie Revering M.D.   On: 01/13/2020 08:25        ASSESSMENT and THERAPY PLAN:   Malignant neoplasm of upper-inner quadrant of left breast in female, estrogen receptor positive ([place]) 03/15/2019:Left lumpectomy [name]): IDC with DCIS, 0.3cm, grade 1, clear margins, and 4 left axillary lymph nodes negative.ER 100%, PR 90%, Ki-67 10%, HER-2 negative, T1 a N0 stage Ia Adjuvant radiation 04/13/2019-05/10/2019 Anastrozole from 05/2019 through 11/2019--stopped due to significant arthralgias. Switched to letrozole 01/10/2020 ______________________________________________________________________________________________   She continues to smoke cigarettes.  She is not yet ready to quit.  Breast cancer surveillance: 1. Mammogram and ultrasound 01/13/2020: No malignancy  2. breast exam 07/2020: benign  We reviewed health maintenance in detail.  We discussed bone health and her most recent bone density is good.    She continues on Tamoxifen, we reviewed risks, and she understands to let us know if she has any concerns for side effects.  She is overdue for colonoscopy, and I referred her to [facility] for this today.  She also now meets lung cancer screening criteria with the USPTF age range change to include women as young as 76.  She is 27 and has a 30 pack year tobacco history and would benefit going ahead and getting started with this.    We will check a urinalysis and culture today to evaluate the pelvic pain.  If negative will order pelvic ultrasound.  Return to clinic in 6 months for follow-up   Orders Placed This Encounter  Procedures  . Culture, Urine  Standing Status:   Future    Number of Occurrences:   1    Standing Expiration Date:   08/23/2021  . CT CHEST LUNG CA  SCREEN LOW DOSE W/O CM    Standing Status:   Future    Standing Expiration Date:   08/23/2021    Order Specific Question:   Reason for Exam (SYMPTOM  OR DIAGNOSIS REQUIRED)    Answer:   following USPTF guidelines that has opened up to 55 years old.  She has 2ppd tobacco history x 15 years.    Order Specific Question:   Is patient pregnant?    Answer:   No  . Urinalysis, Complete w Microscopic    Standing Status:   Future    Number of Occurrences:   1    Standing Expiration Date:   08/23/2021  . Ambulatory referral to Gastroenterology    Referral Priority:   Routine    Referral Type:   Consultation    Referral Reason:   Specialty Services Required    Referred to Provider:   Gatha Mayer, MD    Number of Visits Requested:   1    All questions were answered. The patient knows to call the clinic with any problems, questions or concerns. We can certainly see the patient much sooner if necessary.  Total encounter time: 30 minutes*  Wilber Bihari, NP 08/23/20 11:39 AM Medical Oncology and Hematology [facility] [place], [place] [address] Tel. [phone]    Fax. [phone]  *Total Encounter Time as defined by the Centers for Medicare and Medicaid Services includes, in addition to the face-to-face time of a patient visit (documented in the note above) non-face-to-face time: obtaining and reviewing outside history, ordering and reviewing medications, tests or procedures, care coordination (communications with other health care professionals or caregivers) and documentation in the medical record.

## 2020-08-22 NOTE — Assessment & Plan Note (Addendum)
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 Switched to letrozole 01/10/2020 ______________________________________________________________________________________________   She continues to smoke cigarettes.  She is not yet ready to quit.  Breast cancer surveillance: 1. Mammogram and ultrasound 01/13/2020: No malignancy  2. breast exam 07/2020: benign  We reviewed health maintenance in detail.  We discussed bone health and her most recent bone density is good.    She continues on Tamoxifen, we reviewed risks, and she understands to let us know if she has any concerns for side effects.  She is overdue for colonoscopy, and I referred her to [facility] for this today.  She also now meets lung cancer screening criteria with the USPTF age range change to include women as young as 57.  She is 30 and has a 30 pack year tobacco history and would benefit going ahead and getting started with this.    We will check a urinalysis and culture today to evaluate the pelvic pain.  If negative will order pelvic ultrasound.  Return to clinic in 6 months for follow-up

## 2020-08-23 ENCOUNTER — Telehealth: Payer: Self-pay | Admitting: *Deleted

## 2020-08-23 ENCOUNTER — Inpatient Hospital Stay: Payer: Medicaid Other

## 2020-08-23 ENCOUNTER — Other Ambulatory Visit: Payer: Self-pay

## 2020-08-23 ENCOUNTER — Encounter: Payer: Self-pay | Admitting: Adult Health

## 2020-08-23 ENCOUNTER — Inpatient Hospital Stay: Payer: Medicaid Other | Attending: Adult Health | Admitting: Adult Health

## 2020-08-23 ENCOUNTER — Other Ambulatory Visit: Payer: Self-pay | Admitting: *Deleted

## 2020-08-23 VITALS — BP 126/85 | HR 74 | Temp 97.7°F | Resp 17 | Ht 63.0 in | Wt 181.2 lb

## 2020-08-23 DIAGNOSIS — G2581 Restless legs syndrome: Secondary | ICD-10-CM | POA: Diagnosis not present

## 2020-08-23 DIAGNOSIS — I10 Essential (primary) hypertension: Secondary | ICD-10-CM | POA: Diagnosis not present

## 2020-08-23 DIAGNOSIS — Z923 Personal history of irradiation: Secondary | ICD-10-CM | POA: Insufficient documentation

## 2020-08-23 DIAGNOSIS — R1900 Intra-abdominal and pelvic swelling, mass and lump, unspecified site: Secondary | ICD-10-CM

## 2020-08-23 DIAGNOSIS — Z1211 Encounter for screening for malignant neoplasm of colon: Secondary | ICD-10-CM

## 2020-08-23 DIAGNOSIS — Z833 Family history of diabetes mellitus: Secondary | ICD-10-CM | POA: Insufficient documentation

## 2020-08-23 DIAGNOSIS — Z8349 Family history of other endocrine, nutritional and metabolic diseases: Secondary | ICD-10-CM | POA: Insufficient documentation

## 2020-08-23 DIAGNOSIS — Z90710 Acquired absence of both cervix and uterus: Secondary | ICD-10-CM | POA: Insufficient documentation

## 2020-08-23 DIAGNOSIS — J449 Chronic obstructive pulmonary disease, unspecified: Secondary | ICD-10-CM | POA: Diagnosis not present

## 2020-08-23 DIAGNOSIS — F17210 Nicotine dependence, cigarettes, uncomplicated: Secondary | ICD-10-CM | POA: Insufficient documentation

## 2020-08-23 DIAGNOSIS — C50212 Malignant neoplasm of upper-inner quadrant of left female breast: Secondary | ICD-10-CM | POA: Diagnosis present

## 2020-08-23 DIAGNOSIS — E7800 Pure hypercholesterolemia, unspecified: Secondary | ICD-10-CM | POA: Diagnosis not present

## 2020-08-23 DIAGNOSIS — Z8249 Family history of ischemic heart disease and other diseases of the circulatory system: Secondary | ICD-10-CM | POA: Insufficient documentation

## 2020-08-23 DIAGNOSIS — Z170 Estrogen receptor positive status [ER+]: Secondary | ICD-10-CM

## 2020-08-23 DIAGNOSIS — Z79811 Long term (current) use of aromatase inhibitors: Secondary | ICD-10-CM | POA: Diagnosis not present

## 2020-08-23 DIAGNOSIS — Z720 Tobacco use: Secondary | ICD-10-CM

## 2020-08-23 LAB — URINALYSIS, COMPLETE (UACMP) WITH MICROSCOPIC
Bilirubin Urine: NEGATIVE
Glucose, UA: NEGATIVE mg/dL
Hgb urine dipstick: NEGATIVE
Ketones, ur: NEGATIVE mg/dL
Nitrite: NEGATIVE
Protein, ur: NEGATIVE mg/dL
Specific Gravity, Urine: 1.003 — ABNORMAL LOW (ref 1.005–1.030)
pH: 7 (ref 5.0–8.0)

## 2020-08-23 MED ORDER — CIPROFLOXACIN HCL 250 MG PO TABS: 250.0000 mg | ORAL_TABLET | Freq: Two times a day (BID) | ORAL | 0 refills | Status: DC

## 2020-08-23 NOTE — Telephone Encounter (Signed)
Per Annabelle Harman, called to make pt aware of UA results. Advised to pick up prescription for Cipro from pharmacy. Pt verbalized understanding

## 2020-08-24 ENCOUNTER — Telehealth: Payer: Self-pay | Admitting: Hematology and Oncology

## 2020-08-24 LAB — URINE CULTURE: Culture: NO GROWTH

## 2020-08-24 NOTE — Telephone Encounter (Signed)
Scheduled appts per 2/24 los. Pt confirmed appt date/time.  

## 2020-09-03 DIAGNOSIS — E785 Hyperlipidemia, unspecified: Secondary | ICD-10-CM | POA: Insufficient documentation

## 2020-09-20 IMAGING — DX LUMBAR SPINE - COMPLETE 4+ VIEW
5 series · 5 of 5 positions shown · non-contrast
Comparison: Radiographs September 15, 2017.

CLINICAL DATA: Acute lower back pain after fall several weeks ago.

EXAM:
LUMBAR SPINE - COMPLETE 4+ VIEW

[l-spine ap]
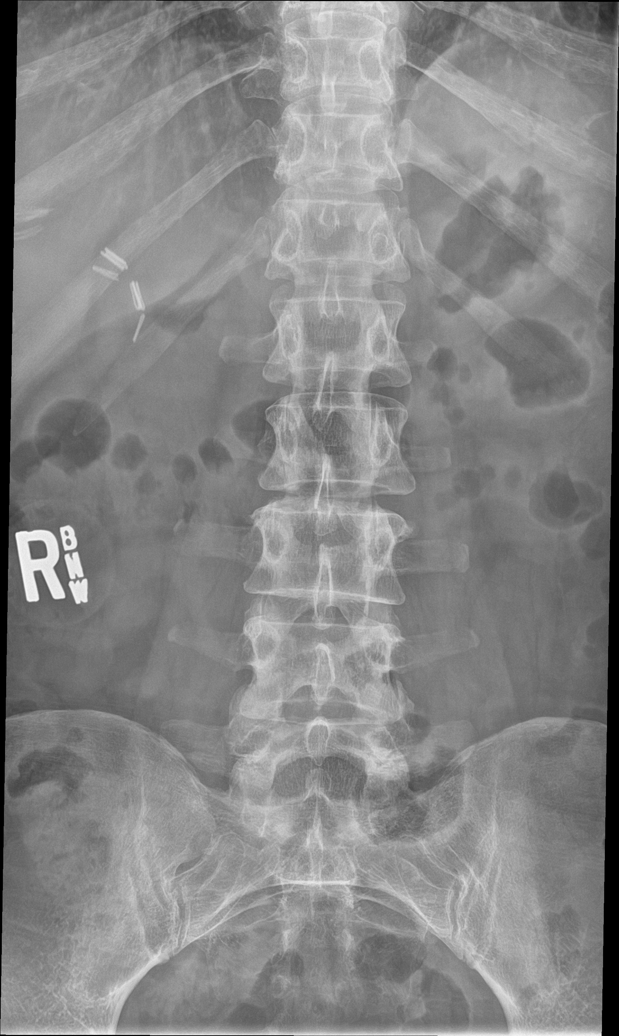

[l-spine obl (1 of 2)]
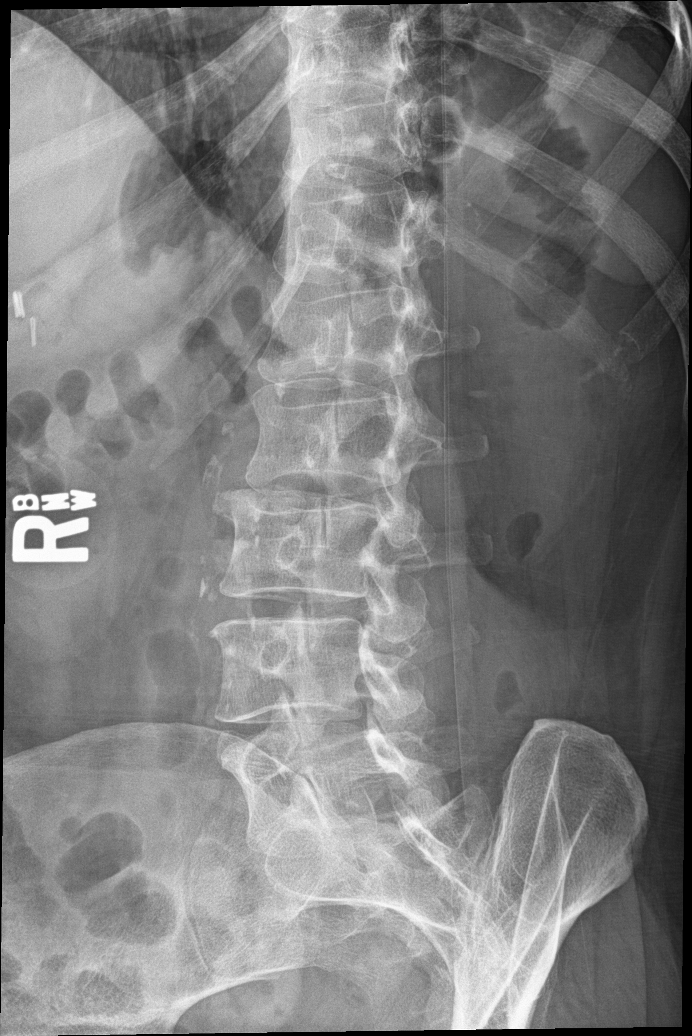

[l-spine obl (2 of 2)]
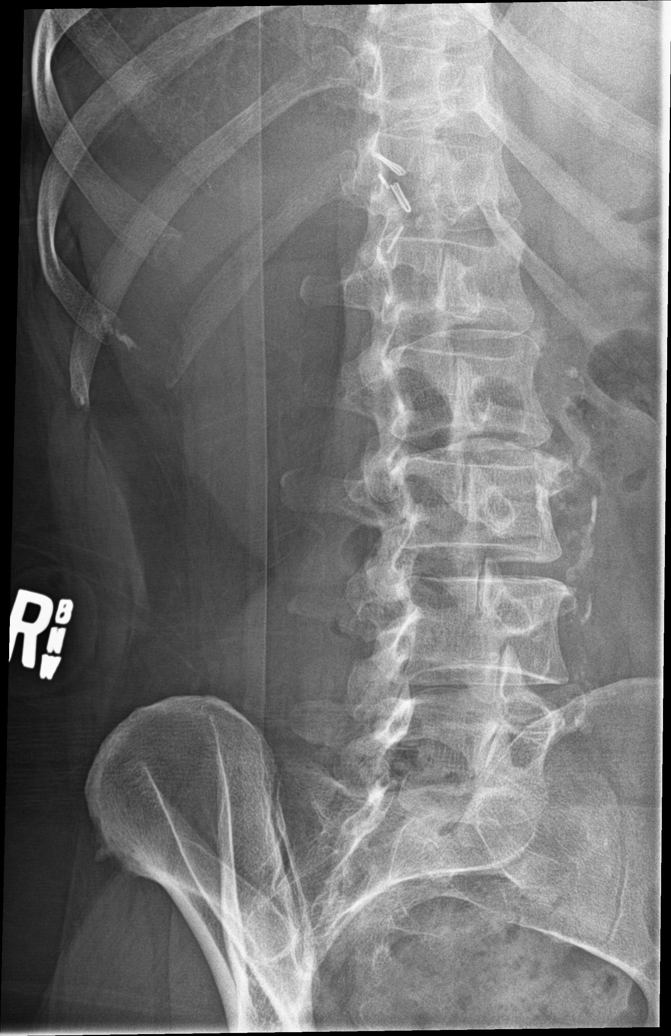

[l-spine lat]
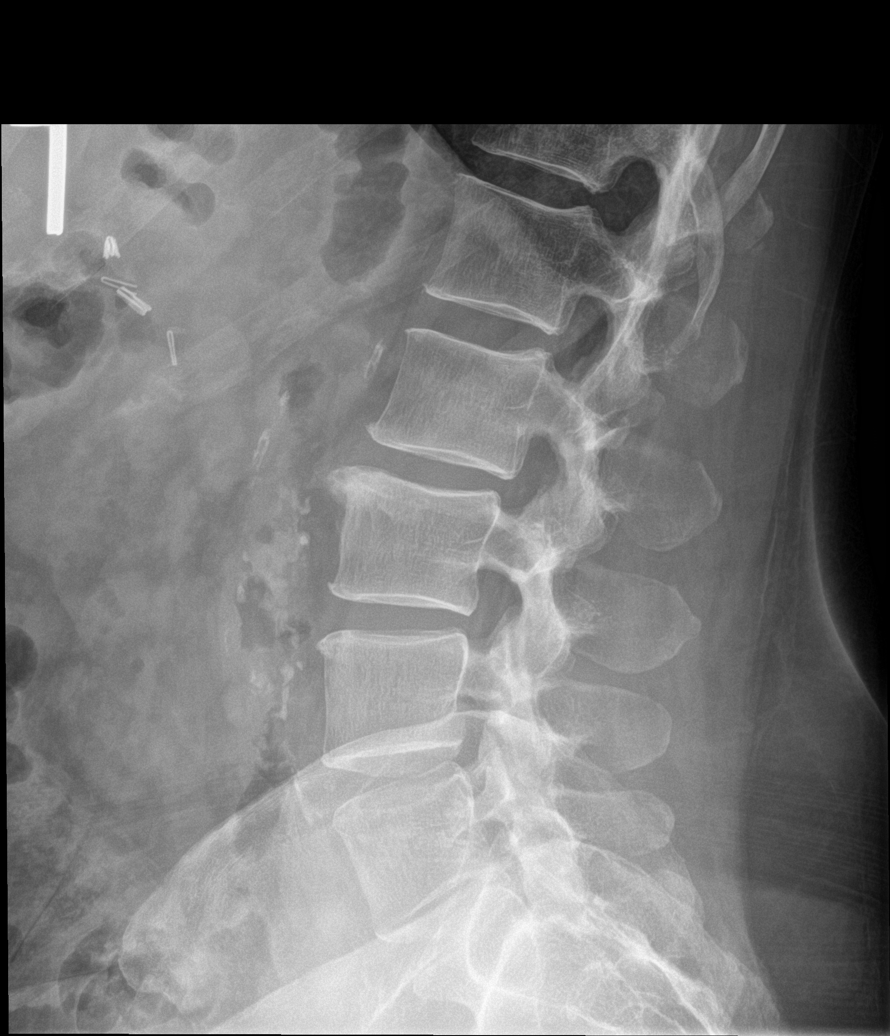

[l-spine spot]
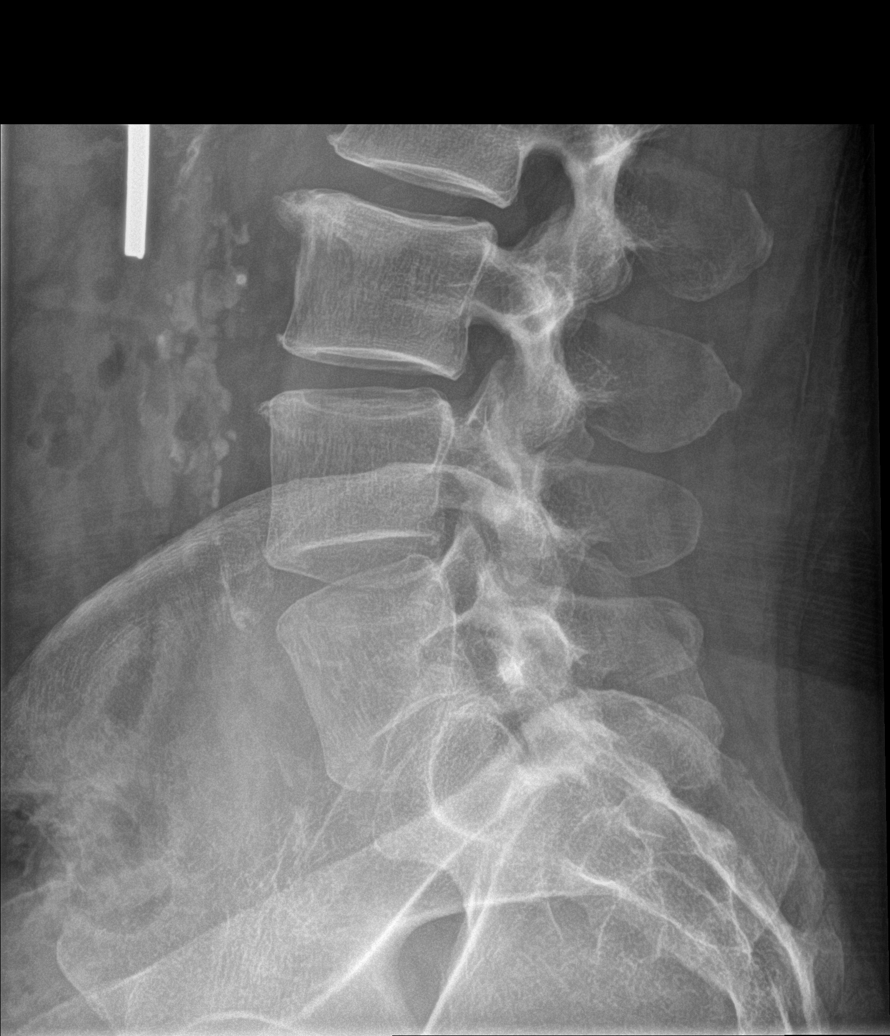

[5 of 5 positions shown; findings below may reference images not displayed]

FINDINGS: No fracture or spondylolisthesis is noted. Mild degenerative disc
disease is noted at L2-3 with anterior osteophyte formation.
Remaining disc spaces are unremarkable. Atherosclerosis of abdominal
aorta is noted.
IMPRESSION: Mild degenerative disc disease is noted at L2-3. No acute
abnormality seen in the lumbar spine.

Aortic Atherosclerosis ([id number]).

## 2020-10-19 DIAGNOSIS — R030 Elevated blood-pressure reading, without diagnosis of hypertension: Secondary | ICD-10-CM | POA: Insufficient documentation

## 2020-10-19 DIAGNOSIS — N644 Mastodynia: Secondary | ICD-10-CM | POA: Insufficient documentation

## 2020-10-25 ENCOUNTER — Ambulatory Visit (AMBULATORY_SURGERY_CENTER): Payer: Self-pay | Admitting: *Deleted

## 2020-10-25 ENCOUNTER — Other Ambulatory Visit: Payer: Self-pay

## 2020-10-25 VITALS — Ht 63.5 in | Wt 175.0 lb

## 2020-10-25 DIAGNOSIS — Z1211 Encounter for screening for malignant neoplasm of colon: Secondary | ICD-10-CM

## 2020-10-25 MED ORDER — CLENPIQ 10-3.5-12 MG-GM -GM/160ML PO SOLN: 160.0000 mL | Freq: Once | ORAL | 0 refills | Status: AC

## 2020-10-25 NOTE — Progress Notes (Signed)

## 2020-11-08 ENCOUNTER — Other Ambulatory Visit: Payer: Self-pay

## 2020-11-08 ENCOUNTER — Encounter: Payer: Self-pay | Admitting: Gastroenterology

## 2020-11-08 ENCOUNTER — Ambulatory Visit (AMBULATORY_SURGERY_CENTER): Payer: Medicaid Other | Admitting: Gastroenterology

## 2020-11-08 VITALS — BP 104/53 | HR 58 | Temp 97.5°F | Resp 10 | Ht 63.5 in | Wt 175.0 lb

## 2020-11-08 DIAGNOSIS — D125 Benign neoplasm of sigmoid colon: Secondary | ICD-10-CM

## 2020-11-08 DIAGNOSIS — Z1211 Encounter for screening for malignant neoplasm of colon: Secondary | ICD-10-CM

## 2020-11-08 DIAGNOSIS — K635 Polyp of colon: Secondary | ICD-10-CM

## 2020-11-08 MED ORDER — SODIUM CHLORIDE 0.9 % IV SOLN: 500.0000 mL | Freq: Once | INTRAVENOUS | Status: DC

## 2020-11-08 NOTE — Progress Notes (Signed)
To PACU, VSS. Report to [email or web address].tb 

## 2020-11-08 NOTE — Progress Notes (Signed)
VS by [place]  Pt's states no medical or surgical changes since previsit or office visit.  

## 2020-11-08 NOTE — Progress Notes (Signed)
Called to room to assist during endoscopic procedure.  Patient ID and intended procedure confirmed with present staff. Received instructions for my participation in the procedure from the performing physician.  

## 2020-11-08 NOTE — Patient Instructions (Signed)
Handout on polyps& hemorrhoids  given to you today  Await pathology results on polyps removed today   YOU HAD AN ENDOSCOPIC PROCEDURE TODAY AT [facility]:   Refer to the procedure report that was given to you for any specific questions about what was found during the examination.  If the procedure report does not answer your questions, please call your gastroenterologist to clarify.  If you requested that your care partner not be given the details of your procedure findings, then the procedure report has been included in a sealed envelope for you to review at your convenience later.  YOU SHOULD EXPECT: Some feelings of bloating in the abdomen. Passage of more gas than usual.  Walking can help get rid of the air that was put into your GI tract during the procedure and reduce the bloating. If you had a lower endoscopy (such as a colonoscopy or flexible sigmoidoscopy) you may notice spotting of blood in your stool or on the toilet paper. If you underwent a bowel prep for your procedure, you may not have a normal bowel movement for a few days.  Please Note:  You might notice some irritation and congestion in your nose or some drainage.  This is from the oxygen used during your procedure.  There is no need for concern and it should clear up in a day or so.  SYMPTOMS TO REPORT IMMEDIATELY:   Following lower endoscopy (colonoscopy or flexible sigmoidoscopy):  Excessive amounts of blood in the stool  Significant tenderness or worsening of abdominal pains  Swelling of the abdomen that is new, acute  Fever of 100F or higher    For urgent or emergent issues, a gastroenterologist can be reached at any hour by calling [phone]. Do not use MyChart messaging for urgent concerns.    DIET:  We do recommend a small meal at first, but then you may proceed to your regular diet.  Drink plenty of fluids but you should avoid alcoholic beverages for 24 hours.  ACTIVITY:  You should  plan to take it easy for the rest of today and you should NOT DRIVE or use heavy machinery until tomorrow (because of the sedation medicines used during the test).    FOLLOW UP: Our staff will call the number listed on your records 48-72 hours following your procedure to check on you and address any questions or concerns that you may have regarding the information given to you following your procedure. If we do not reach you, we will leave a message.  We will attempt to reach you two times.  During this call, we will ask if you have developed any symptoms of COVID 19. If you develop any symptoms (ie: fever, flu-like symptoms, shortness of breath, cough etc.) before then, please call [phone].  If you test positive for Covid 19 in the 2 weeks post procedure, please call and report this information to [place].    If any biopsies were taken you will be contacted by phone or by letter within the next 1-3 weeks.  Please call us at [phone] if you have not heard about the biopsies in 3 weeks.    SIGNATURES/CONFIDENTIALITY: You and/or your care partner have signed paperwork which will be entered into your electronic medical record.  These signatures attest to the fact that that the information above on your After Visit Summary has been reviewed and is understood.  Full responsibility of the confidentiality of this discharge information lies with you  and/or your care-partner. 

## 2020-11-08 NOTE — Op Note (Signed)
[facility] Patient Name: Elizabeth Bullock Procedure Date: 11/08/2020 10:16 AM MRN: [id number] Endoscopist: Jackquline Denmark , MD Age: 55 Referring MD:  Date of Birth: 1966/01/31 Gender: Female Account #: [id number] Procedure:                Colonoscopy Indications:              Screening for colorectal malignant neoplasm Medicines:                Monitored Anesthesia Care Procedure:                Pre-Anesthesia Assessment:                           - Prior to the procedure, a History and Physical                            was performed, and patient medications and                            allergies were reviewed. The patient's tolerance of                            previous anesthesia was also reviewed. The risks                            and benefits of the procedure and the sedation                            options and risks were discussed with the patient.                            All questions were answered, and informed consent                            was obtained. Prior Anticoagulants: The patient has                            taken no previous anticoagulant or antiplatelet                            agents. ASA Grade Assessment: II - A patient with                            mild systemic disease. After reviewing the risks                            and benefits, the patient was deemed in                            satisfactory condition to undergo the procedure.                           After obtaining informed consent, the colonoscope  was passed under direct vision. Throughout the                            procedure, the patient's blood pressure, pulse, and                            oxygen saturations were monitored continuously. The                            #[id number] was introduced through the anus and                            advanced to the 2 cm into the ileum. The                            colonoscopy was performed  without difficulty. The                            patient tolerated the procedure well. The quality                            of the bowel preparation was adequate to identify                            polyps. The terminal ileum, ileocecal valve,                            appendiceal orifice, and rectum were photographed. Scope In: 10:29:00 AM Scope Out: 10:46:27 AM Scope Withdrawal Time: 0 hours 14 minutes 45 seconds  Total Procedure Duration: 0 hours 17 minutes 27 seconds  Findings:                 Five sessile polyps were found in the mid sigmoid                            colon and distal sigmoid colon. The polyps were 4                            to 6 mm in size. These polyps were removed with a                            cold snare. Resection and retrieval were complete.                           Non-bleeding internal hemorrhoids were found during                            retroflexion. The hemorrhoids were small.                           The terminal ileum appeared normal.                           The exam was otherwise  without abnormality on                            direct and retroflexion views. Complications:            No immediate complications. Estimated Blood Loss:     Estimated blood loss: none. Impression:               - Five 4 to 6 mm polyps in the mid sigmoid colon                            and in the distal sigmoid colon, removed with a                            cold snare. Resected and retrieved.                           - Non-bleeding internal hemorrhoids.                           - The examined portion of the ileum was normal.                           - The examination was otherwise normal on direct                            and retroflexion views. Recommendation:           - Patient has a contact number available for                            emergencies. The signs and symptoms of potential                            delayed complications were  discussed with the                            patient. Return to normal activities tomorrow.                            Written discharge instructions were provided to the                            patient.                           - Resume previous diet.                           - Continue present medications.                           - Await pathology results.                           - Repeat colonoscopy in 3-5 years for surveillance  based on pathology results, with 2-day prep.                           - The findings and recommendations were discussed                            with the patient's family. Jackquline Denmark, MD 11/08/2020 10:49:56 AM This report has been signed electronically.

## 2020-11-12 ENCOUNTER — Telehealth: Payer: Self-pay

## 2020-11-12 NOTE — Telephone Encounter (Signed)
Left message on follow up call. 

## 2020-11-12 NOTE — Telephone Encounter (Signed)
LVM

## 2020-11-20 ENCOUNTER — Encounter: Payer: Self-pay | Admitting: Gastroenterology

## 2020-12-10 DIAGNOSIS — R935 Abnormal findings on diagnostic imaging of other abdominal regions, including retroperitoneum: Secondary | ICD-10-CM | POA: Insufficient documentation

## 2020-12-11 DIAGNOSIS — R1013 Epigastric pain: Secondary | ICD-10-CM | POA: Insufficient documentation

## 2020-12-23 ENCOUNTER — Other Ambulatory Visit: Payer: Self-pay | Admitting: Hematology and Oncology

## 2020-12-25 ENCOUNTER — Other Ambulatory Visit: Payer: Self-pay | Admitting: Physician Assistant

## 2020-12-25 ENCOUNTER — Other Ambulatory Visit (HOSPITAL_COMMUNITY): Payer: Self-pay | Admitting: Physician Assistant

## 2020-12-25 DIAGNOSIS — R935 Abnormal findings on diagnostic imaging of other abdominal regions, including retroperitoneum: Secondary | ICD-10-CM

## 2020-12-28 NOTE — Telephone Encounter (Signed)
Patient called states she is well no questions at this time.

## 2021-01-01 ENCOUNTER — Other Ambulatory Visit (HOSPITAL_COMMUNITY): Payer: Self-pay | Admitting: Physician Assistant

## 2021-01-01 ENCOUNTER — Other Ambulatory Visit: Payer: Self-pay

## 2021-01-01 ENCOUNTER — Ambulatory Visit (HOSPITAL_COMMUNITY)
Admission: RE | Admit: 2021-01-01 | Discharge: 2021-01-01 | Disposition: A | Discharge status: Home / Self Care | Payer: Medicaid Other | Source: Ambulatory Visit | Attending: Physician Assistant | Admitting: Physician Assistant

## 2021-01-01 DIAGNOSIS — R935 Abnormal findings on diagnostic imaging of other abdominal regions, including retroperitoneum: Secondary | ICD-10-CM

## 2021-01-02 ENCOUNTER — Ambulatory Visit (HOSPITAL_COMMUNITY): Payer: Medicaid Other | Attending: Physician Assistant | Admitting: Physical Therapy

## 2021-01-02 DIAGNOSIS — M6281 Muscle weakness (generalized): Secondary | ICD-10-CM

## 2021-01-02 DIAGNOSIS — M545 Low back pain, unspecified: Secondary | ICD-10-CM | POA: Diagnosis present

## 2021-01-02 DIAGNOSIS — G8929 Other chronic pain: Secondary | ICD-10-CM | POA: Insufficient documentation

## 2021-01-02 NOTE — Therapy (Signed)
[place], [place], [address] Phone: [phone]   Fax:  [phone]  Physical Therapy Evaluation  Patient Details  Name: Elizabeth Bullock MRN: [id number] Date of Birth: 1965/10/29 Referring Provider (PT): Particia Nearing, PA-C   Encounter Date: 01/02/2021   PT End of Session - 01/02/21 1509     Visit Number 1    Number of Visits 12    Date for PT Re-Evaluation 02/13/21    Authorization Type [employer] med[employer]- auth requested on 01/02/21    Progress Note Due on Visit 10    PT Start Time 1549    PT Stop Time 1515    PT Time Calculation (min) 1406 min    Activity Tolerance Patient tolerated treatment well    Behavior During Therapy [facility] for tasks assessed/performed             Past Medical History:  Diagnosis Date   Anxiety    Bipolar disorder ([facility])    Breast cancer ([facility])    radiation   Bronchitis, chronic ([place])    COPD (chronic obstructive pulmonary disease) ([facility])    DDD (degenerative disc disease), cervical    DDD (degenerative disc disease), lumbar    Degenerative disorder of bone    OF BACK   Depression    GERD (gastroesophageal reflux disease)    Headache(784.0)    History of migraines,  takes propanolol for headaches   High cholesterol    Hypertension    Neuropathy    h/o   Personal history of radiation therapy    RLS (restless legs syndrome)    Tobacco abuse    Tremor     Past Surgical History:  Procedure Laterality Date   ABDOMINAL HYSTERECTOMY     ANKLE SURGERY Left    left ankle surgery as teenager per pt   BREAST LUMPECTOMY Left 03/15/2019   BREAST LUMPECTOMY WITH RADIOACTIVE SEED AND SENTINEL LYMPH NODE BIOPSY Left 03/15/2019   Procedure: LEFT BREAST LUMPECTOMY WITH RADIOACTIVE SEED AND AXILLARY SENTINEL LYMPH NODE BIOPSY;  Surgeon: [name], MD;  Location: [facility];  Service: General;  Laterality: Left;   CHOLECYSTECTOMY  03/13/2011   Procedure: LAPAROSCOPIC CHOLECYSTECTOMY;   Surgeon: Donato Heinz;  Location: [facility];  Service: General;  Laterality: N/A;    There were no vitals filed for this visit.    Subjective Assessment - 01/02/21 1455     Subjective States that she has been having some back pain. States she thinks she pulled her groin muscle as she has to pick up her legs with her hands to get them in the bed. States that this has been going on for about a month with her legs and her back has been going on for years. States that she has never had treatment for her back. States she has some days that are terrible. States she has to take care of her parents. Current back is 5/10 and is described as stabbing. States that she has restless leg, a couple abdominal surgeries and a history of left breast cancer. STates she can't clean or cook for long periods of time and sometimes has to wear a back brace to help.    Pertinent History depression, multiple abdominal surgeries    Limitations Standing;Walking    How long can you stand comfortably? 1 hour    Patient Stated Goals STates that she would like to have less pain with cooking and cleaning.    Currently in Pain?  Yes    Pain Score 5     Pain Location Back    Pain Orientation Mid    Pain Descriptors / Indicators Aching;Stabbing    Pain Type Chronic pain    Aggravating Factors  standing, walking, cleaning, laying down    Pain Relieving Factors laying, gabapetin                [facility] PT Assessment - 01/02/21 0001       Assessment   Medical Diagnosis LBP    Referring Provider (PT) Particia Nearing, PA-C    Onset Date/Surgical Date --   years ago   Next MD Visit 01/11/21    Prior Therapy no      Balance Screen   Has the patient fallen in the past 6 months Yes    How many times? 1    Has the patient had a decrease in activity level because of a fear of falling?  Yes    Is the patient reluctant to leave their home because of a fear of falling?  No      Prior Function   Level of Independence Independent       Cognition   Overall Cognitive Status Within Functional Limits for tasks assessed      Observation/Other Assessments   Focus on Therapeutic Outcomes (FOTO)  NA      ROM / Strength   AROM / PROM / Strength AROM;Strength      AROM   AROM Assessment Site Lumbar    Lumbar Flexion 50% limited   decreased pain noted   Lumbar Extension 100% limited   increased in back pain   Lumbar - Right Side Bend 25% limited   comfortable   Lumbar - Left Side Bend 25% limited   comfortable     Strength   Strength Assessment Site Hip;Knee;Ankle    Right/Left Hip Left;Right    Right Hip Flexion 3+/5   pain along front of hip   Right Hip Extension 3+/5   increased left leg pain   Right Hip ABduction 4-/5    Left Hip Flexion 3-/5    Left Hip Extension 3+/5   increased right leg pain   Left Hip ABduction 3/5   painful in groin area   Right/Left Knee Left;Right    Right Knee Flexion 3+/5    Right Knee Extension 4-/5   pain in thighs   Left Knee Flexion 3+/5    Left Knee Extension 4-/5   pain in thighs   Right/Left Ankle Right;Left      Special Tests    Special Tests Lumbar    Lumbar Tests Slump Test;Prone Knee Bend Test      Slump test   Findings Negative    Comment on both sides      Prone Knee Bend Test   Findings Negative    Comment bilateral      Ambulation/Gait   Ambulation/Gait Yes    Ambulation Distance (Feet) 350 Feet    Assistive device None    Gait Pattern Trunk flexed;Decreased trunk rotation    Ambulation Surface Level;Indoor    Gait velocity decreased    Gait Comments 2MW, less pain afterwards                        Objective measurements completed on examination: See above findings.               PT Education - 01/02/21 1521  Education Details on current condition, on HEP, on findings and POC, on walking program    Person(s) Educated Patient    Methods Explanation    Comprehension Verbalized understanding              PT Short  Term Goals - 01/02/21 1516       PT SHORT TERM GOAL #1   Title Patient will report participating in daily walkign program inside home to improve overall endurance    Time 3    Period Weeks    Status New    Target Date 01/23/21      PT SHORT TERM GOAL #2   Title Patient will report at least 50% improvement in overall symptoms and/or function to demonstrate improved functional mobility    Time 3    Period Weeks    Status New    Target Date 01/23/21      PT SHORT TERM GOAL #3   Title Patient will be independent in self management strategies to improve quality of life and functional outcomes.    Time 3    Period Weeks    Status New    Target Date 01/23/21               PT Long Term Goals - 01/02/21 1517       PT LONG TERM GOAL #1   Title Patient will be able demosntrate painfree lumbar ROM and painfree MMT    Time 6    Period Weeks    Status New    Target Date 02/13/21      PT LONG TERM GOAL #2   Title Patient will report ability to cook/clean without difficulty to improve ability to take care of her parents.    Time 6    Period Weeks    Status New    Target Date 02/13/21      PT LONG TERM GOAL #3   Title Patient will report at least 75% improvement in overall symptoms and/or function to demonstrate improved functional mobility    Time 6    Period Weeks    Status New    Target Date 02/13/21                    Plan - 01/02/21 1519     Clinical Impression Statement Patient is a 55 y.o. female who presents to physical therapy with complaint of chronic low back pain. Patient demonstrates decreased strength, ROM restriction, and gait abnormalities which are likely contributing to symptoms of pain and are negatively impacting patient ability to perform ADLs and functional mobility tasks. Patient will benefit from skilled physical therapy services to address these deficits to reduce pain, improve level of function with ADLs, functional mobility tasks, and  reduce risk for falls.    Personal Factors and Comorbidities Comorbidity 1    Comorbidities multiple abdominal surgeries    Examination-Activity Limitations Caring for Others;Locomotion Level;Lift;Stand;Stairs;Bend    Examination-Participation Restrictions Cleaning;Community Activity;Laundry;Yard Work;Shop    Stability/Clinical Decision Making Stable/Uncomplicated    Clinical Decision Making Low    Rehab Potential Fair    PT Frequency 2x / week    PT Duration 6 weeks    PT Treatment/Interventions ADLs/Self Care Home Management;Cryotherapy;Moist Heat;Traction;Therapeutic exercise;Manual techniques;Therapeutic activities;Neuromuscular re-education;Patient/family education;Dry needling;Passive range of motion;Joint Manipulations    PT Next Visit Plan hip and lumbar ROM, LE and core strengthening,    PT Home Exercise Plan [facility], walking program  Patient will benefit from skilled therapeutic intervention in order to improve the following deficits and impairments:  Pain, Decreased activity tolerance, Decreased mobility, Improper body mechanics, Decreased range of motion, Decreased strength, Decreased endurance  Visit Diagnosis: Chronic midline low back pain without sciatica  Muscle weakness (generalized)     Problem List Patient Active Problem List   Diagnosis Date Noted   Malignant neoplasm of upper-inner quadrant of left breast in female, estrogen receptor positive ([place]) 02/11/2019   Screening breast examination 02/08/2019   Nausea with vomiting 02/09/2015   Pain in the chest    Gastroenteritis    Hypertension    Tobacco abuse    Chest pain 02/08/2015   3:22 PM, 01/02/21 Jerene Pitch, DPT Physical Therapy with [facility]  [phone] office   [place], [place], [address] Phone: [phone]   Fax:  [phone]  Name: Elizabeth Bullock MRN: [id number] Date of Birth:  04/05/1966

## 2021-01-04 ENCOUNTER — Encounter (HOSPITAL_COMMUNITY): Payer: Self-pay | Admitting: Physical Therapy

## 2021-01-04 ENCOUNTER — Ambulatory Visit (HOSPITAL_COMMUNITY): Payer: Medicaid Other | Admitting: Physical Therapy

## 2021-01-04 ENCOUNTER — Other Ambulatory Visit: Payer: Self-pay

## 2021-01-04 DIAGNOSIS — M545 Low back pain, unspecified: Secondary | ICD-10-CM

## 2021-01-04 DIAGNOSIS — M6281 Muscle weakness (generalized): Secondary | ICD-10-CM

## 2021-01-04 DIAGNOSIS — G8929 Other chronic pain: Secondary | ICD-10-CM

## 2021-01-04 NOTE — Therapy (Signed)
[facility] [address] [place], [place], [address] Phone: [phone]   Fax:  [phone]  Physical Therapy Treatment  Patient Details  Name: Elizabeth Bullock MRN: [id number] Date of Birth: [birth year] Referring Provider (PT): Particia Nearing, PA-C   Encounter Date: 01/04/2021   PT End of Session - 01/04/21 1501     Visit Number 2    Number of Visits 12    Date for PT Re-Evaluation 02/13/21    Authorization Type [employer] med[employer]- auth requested on 01/02/21    Progress Note Due on Visit 10    PT Start Time 1445    PT Stop Time 1523    PT Time Calculation (min) 38 min    Activity Tolerance Patient tolerated treatment well    Behavior During Therapy WFL for tasks assessed/performed             Past Medical History:  Diagnosis Date   Anxiety    Bipolar disorder ([place])    Breast cancer ([place])    radiation   Bronchitis, chronic ([place])    COPD (chronic obstructive pulmonary disease) ([place])    DDD (degenerative disc disease), cervical    DDD (degenerative disc disease), lumbar    Degenerative disorder of bone    OF BACK   Depression    GERD (gastroesophageal reflux disease)    Headache(784.0)    History of migraines,  takes propanolol for headaches   High cholesterol    Hypertension    Neuropathy    h/o   Personal history of radiation therapy    RLS (restless legs syndrome)    Tobacco abuse    Tremor     Past Surgical History:  Procedure Laterality Date   ABDOMINAL HYSTERECTOMY     ANKLE SURGERY Left    left ankle surgery as teenager per pt   BREAST LUMPECTOMY Left 03/15/2019   BREAST LUMPECTOMY WITH RADIOACTIVE SEED AND SENTINEL LYMPH NODE BIOPSY Left 03/15/2019   Procedure: LEFT BREAST LUMPECTOMY WITH RADIOACTIVE SEED AND AXILLARY SENTINEL LYMPH NODE BIOPSY;  Surgeon: [name], MD;  Location: [facility];  Service: General;  Laterality: Left;   CHOLECYSTECTOMY  03/13/2011   Procedure: LAPAROSCOPIC CHOLECYSTECTOMY;   Surgeon: Donato Heinz;  Location: [facility];  Service: General;  Laterality: N/A;    There were no vitals filed for this visit.   Subjective Assessment - 01/04/21 1448     Subjective States pain [place] bearable and is more sore from the exercises. States she is a little groggy today from her medication.    Pertinent History depression, multiple abdominal surgeries    Limitations Standing;Walking    How long can you stand comfortably? 1 hour    Patient Stated Goals STates that she would like to have less pain with cooking and cleaning.    Currently in Pain? Yes    Pain Score 8     Pain Location Leg    Pain Descriptors / Indicators Aching;Sore    Pain Type Chronic pain                [facility] PT Assessment - 01/04/21 0001       Assessment   Medical Diagnosis LBP    Referring Provider (PT) Particia Nearing, PA-C                           [facility] Adult PT Treatment/Exercise - 01/04/21 0001       Exercises  Exercises Lumbar      Lumbar Exercises: Seated   Sit to Stand --   with weighted ball yellow, x30 reps     Lumbar Exercises: Supine   Bridge 10 reps;2 seconds   3 sets   Straight Leg Raise 5 reps;2 seconds   4 sets, bilateral   Other Supine Lumbar Exercises SAQs bolster 3x10 5" holds bilateral      Lumbar Exercises: Prone   Straight Leg Raise 5 reps;2 seconds   4 sets on each side   Other Prone Lumbar Exercises hamstring curls 3x10 bilateral                      PT Short Term Goals - 01/02/21 1516       PT SHORT TERM GOAL #1   Title Patient will report participating in daily walkign program inside home to improve overall endurance    Time 3    Period Weeks    Status New    Target Date 01/23/21      PT SHORT TERM GOAL #2   Title Patient will report at least 50% improvement in overall symptoms and/or function to demonstrate improved functional mobility    Time 3    Period Weeks    Status New    Target Date 01/23/21      PT SHORT TERM  GOAL #3   Title Patient will be independent in self management strategies to improve quality of life and functional outcomes.    Time 3    Period Weeks    Status New    Target Date 01/23/21               PT Long Term Goals - 01/02/21 1517       PT LONG TERM GOAL #1   Title Patient will be able demosntrate painfree lumbar ROM and painfree MMT    Time 6    Period Weeks    Status New    Target Date 02/13/21      PT LONG TERM GOAL #2   Title Patient will report ability to cook/clean without difficulty to improve ability to take care of her parents.    Time 6    Period Weeks    Status New    Target Date 02/13/21      PT LONG TERM GOAL #3   Title Patient will report at least 75% improvement in overall symptoms and/or function to demonstrate improved functional mobility    Time 6    Period Weeks    Status New    Target Date 02/13/21                   Plan - 01/04/21 1502     Clinical Impression Statement Progressed HEP today. No increase in pain noted but fatigue in legs noted during and after exercises. Allowed for longer rest breaks secondary to fatigue. Printed off all new exercises for improved adherence to HEP. Improved soreness noted in legs end of session.    Personal Factors and Comorbidities Comorbidity 1    Comorbidities multiple abdominal surgeries    Examination-Activity Limitations Caring for Others;Locomotion Level;Lift;Stand;Stairs;Bend    Examination-Participation Restrictions Cleaning;Community Activity;Laundry;Yard Work;Shop    Stability/Clinical Decision Making Stable/Uncomplicated    Rehab Potential Fair    PT Frequency 2x / week    PT Duration 6 weeks    PT Treatment/Interventions ADLs/Self Care Home Management;Cryotherapy;Moist Heat;Traction;Therapeutic exercise;Manual techniques;Therapeutic activities;Neuromuscular re-education;Patient/family education;Dry needling;Passive range of motion;Joint Manipulations  PT Next Visit Plan hip and  lumbar ROM, LE and core strengthening,    PT Home Exercise Plan SKC, walking program; 7/8 SAQs, SLR, bridges, STS, hip exten, hamstring curl             Patient will benefit from skilled therapeutic intervention in order to improve the following deficits and impairments:  Pain, Decreased activity tolerance, Decreased mobility, Improper body mechanics, Decreased range of motion, Decreased strength, Decreased endurance  Visit Diagnosis: Chronic midline low back pain without sciatica  Muscle weakness (generalized)     Problem List Patient Active Problem List   Diagnosis Date Noted   Malignant neoplasm of upper-inner quadrant of left breast in female, estrogen receptor positive ([place]) 02/11/2019   Screening breast examination 02/08/2019   Nausea with vomiting 02/09/2015   Pain in the chest    Gastroenteritis    Hypertension    Tobacco abuse    Chest pain 02/08/2015   3:23 PM, 01/04/21 Jerene Pitch, DPT Physical Therapy with [facility]  [phone] office   [facility] [place], [place], [address] Phone: [phone]   Fax:  [phone]  Name: Elsa Ploch MRN: [id number] Date of Birth: 1966-01-23

## 2021-01-07 ENCOUNTER — Encounter (HOSPITAL_COMMUNITY): Payer: Self-pay | Admitting: Internal Medicine

## 2021-01-07 ENCOUNTER — Other Ambulatory Visit: Payer: Self-pay

## 2021-01-07 ENCOUNTER — Emergency Department (HOSPITAL_COMMUNITY): Payer: Medicaid Other

## 2021-01-07 ENCOUNTER — Inpatient Hospital Stay (HOSPITAL_COMMUNITY)
Admission: EM | Admit: 2021-01-07 | Discharge: 2021-01-09 | DRG: 641 | Disposition: A | Discharge status: Home / Self Care | Payer: Medicaid Other | Attending: Family Medicine | Admitting: Family Medicine

## 2021-01-07 DIAGNOSIS — R531 Weakness: Secondary | ICD-10-CM | POA: Diagnosis present

## 2021-01-07 DIAGNOSIS — Z800 Family history of malignant neoplasm of digestive organs: Secondary | ICD-10-CM

## 2021-01-07 DIAGNOSIS — Z20822 Contact with and (suspected) exposure to covid-19: Secondary | ICD-10-CM | POA: Diagnosis present

## 2021-01-07 DIAGNOSIS — Z83438 Family history of other disorder of lipoprotein metabolism and other lipidemia: Secondary | ICD-10-CM

## 2021-01-07 DIAGNOSIS — F419 Anxiety disorder, unspecified: Secondary | ICD-10-CM | POA: Diagnosis present

## 2021-01-07 DIAGNOSIS — M5030 Other cervical disc degeneration, unspecified cervical region: Secondary | ICD-10-CM | POA: Diagnosis present

## 2021-01-07 DIAGNOSIS — F319 Bipolar disorder, unspecified: Secondary | ICD-10-CM | POA: Diagnosis present

## 2021-01-07 DIAGNOSIS — E876 Hypokalemia: Secondary | ICD-10-CM | POA: Diagnosis not present

## 2021-01-07 DIAGNOSIS — R7989 Other specified abnormal findings of blood chemistry: Secondary | ICD-10-CM

## 2021-01-07 DIAGNOSIS — Z8249 Family history of ischemic heart disease and other diseases of the circulatory system: Secondary | ICD-10-CM

## 2021-01-07 DIAGNOSIS — Z720 Tobacco use: Secondary | ICD-10-CM | POA: Diagnosis present

## 2021-01-07 DIAGNOSIS — G5790 Unspecified mononeuropathy of unspecified lower limb: Secondary | ICD-10-CM | POA: Diagnosis present

## 2021-01-07 DIAGNOSIS — R682 Dry mouth, unspecified: Secondary | ICD-10-CM | POA: Diagnosis present

## 2021-01-07 DIAGNOSIS — Z79810 Long term (current) use of selective estrogen receptor modulators (SERMs): Secondary | ICD-10-CM

## 2021-01-07 DIAGNOSIS — G2581 Restless legs syndrome: Secondary | ICD-10-CM | POA: Diagnosis present

## 2021-01-07 DIAGNOSIS — Z885 Allergy status to narcotic agent status: Secondary | ICD-10-CM

## 2021-01-07 DIAGNOSIS — Z833 Family history of diabetes mellitus: Secondary | ICD-10-CM

## 2021-01-07 DIAGNOSIS — Z825 Family history of asthma and other chronic lower respiratory diseases: Secondary | ICD-10-CM

## 2021-01-07 DIAGNOSIS — I10 Essential (primary) hypertension: Secondary | ICD-10-CM | POA: Diagnosis not present

## 2021-01-07 DIAGNOSIS — R4781 Slurred speech: Secondary | ICD-10-CM | POA: Diagnosis present

## 2021-01-07 DIAGNOSIS — Z880 Allergy status to penicillin: Secondary | ICD-10-CM

## 2021-01-07 DIAGNOSIS — Z170 Estrogen receptor positive status [ER+]: Secondary | ICD-10-CM

## 2021-01-07 DIAGNOSIS — Z853 Personal history of malignant neoplasm of breast: Secondary | ICD-10-CM

## 2021-01-07 DIAGNOSIS — Z923 Personal history of irradiation: Secondary | ICD-10-CM

## 2021-01-07 DIAGNOSIS — K219 Gastro-esophageal reflux disease without esophagitis: Secondary | ICD-10-CM | POA: Diagnosis present

## 2021-01-07 DIAGNOSIS — J449 Chronic obstructive pulmonary disease, unspecified: Secondary | ICD-10-CM | POA: Diagnosis present

## 2021-01-07 DIAGNOSIS — C50212 Malignant neoplasm of upper-inner quadrant of left female breast: Secondary | ICD-10-CM

## 2021-01-07 DIAGNOSIS — G43909 Migraine, unspecified, not intractable, without status migrainosus: Secondary | ICD-10-CM | POA: Diagnosis present

## 2021-01-07 DIAGNOSIS — F17210 Nicotine dependence, cigarettes, uncomplicated: Secondary | ICD-10-CM | POA: Diagnosis present

## 2021-01-07 DIAGNOSIS — Z90721 Acquired absence of ovaries, unilateral: Secondary | ICD-10-CM

## 2021-01-07 LAB — COMPREHENSIVE METABOLIC PANEL
ALT: 61 U/L — ABNORMAL HIGH (ref 0–44)
AST: 103 U/L — ABNORMAL HIGH (ref 15–41)
Albumin: 3.7 g/dL (ref 3.5–5.0)
Alkaline Phosphatase: 45 U/L (ref 38–126)
Anion gap: 12 (ref 5–15)
BUN: 20 mg/dL (ref 6–20)
CO2: 19 mmol/L — ABNORMAL LOW (ref 22–32)
Calcium: 8.5 mg/dL — ABNORMAL LOW (ref 8.9–10.3)
Chloride: 103 mmol/L (ref 98–111)
Creatinine, Ser: 1.13 mg/dL — ABNORMAL HIGH (ref 0.44–1.00)
GFR, Estimated: 58 mL/min — ABNORMAL LOW (ref >60)
Glucose, Bld: 113 mg/dL — ABNORMAL HIGH (ref 70–99)
Potassium: <2 mmol/L — CL (ref 3.5–5.1)
Sodium: 134 mmol/L — ABNORMAL LOW (ref 135–145)
Total Bilirubin: 0.4 mg/dL (ref 0.3–1.2)
Total Protein: 6.6 g/dL (ref 6.5–8.1)

## 2021-01-07 LAB — URINALYSIS, ROUTINE W REFLEX MICROSCOPIC
Bilirubin Urine: NEGATIVE
Glucose, UA: NEGATIVE mg/dL
Ketones, ur: NEGATIVE mg/dL
Leukocytes,Ua: NEGATIVE
Nitrite: NEGATIVE
Protein, ur: NEGATIVE mg/dL
Specific Gravity, Urine: 1.005 (ref 1.005–1.030)
pH: 7 (ref 5.0–8.0)

## 2021-01-07 LAB — CBC
HCT: 37.2 % (ref 36.0–46.0)
HCT: 35.7 % — ABNORMAL LOW (ref 36.0–46.0)
Hemoglobin: 13.6 g/dL (ref 12.0–15.0)
Hemoglobin: 12.9 g/dL (ref 12.0–15.0)
MCH: 30.8 pg (ref 26.0–34.0)
MCH: 31 pg (ref 26.0–34.0)
MCHC: 36.6 g/dL — ABNORMAL HIGH (ref 30.0–36.0)
MCHC: 36.1 g/dL — ABNORMAL HIGH (ref 30.0–36.0)
MCV: 84.4 fL (ref 80.0–100.0)
MCV: 85.8 fL (ref 80.0–100.0)
Platelets: 382 10*3/uL (ref 150–400)
Platelets: 359 10*3/uL (ref 150–400)
RBC: 4.41 MIL/uL (ref 3.87–5.11)
RBC: 4.16 MIL/uL (ref 3.87–5.11)
RDW: 14.8 % (ref 11.5–15.5)
RDW: 15 % (ref 11.5–15.5)
WBC: 9.7 10*3/uL (ref 4.0–10.5)
WBC: 8.7 10*3/uL (ref 4.0–10.5)
nRBC: 0 % (ref 0.0–0.2)
nRBC: 0 % (ref 0.0–0.2)

## 2021-01-07 LAB — DIFFERENTIAL
Abs Immature Granulocytes: 0.05 10*3/uL (ref 0.00–0.07)
Basophils Absolute: 0 10*3/uL (ref 0.0–0.1)
Basophils Relative: 0 %
Eosinophils Absolute: 0.6 10*3/uL — ABNORMAL HIGH (ref 0.0–0.5)
Eosinophils Relative: 6 %
Immature Granulocytes: 1 %
Lymphocytes Relative: 20 %
Lymphs Abs: 2 10*3/uL (ref 0.7–4.0)
Monocytes Absolute: 0.7 10*3/uL (ref 0.1–1.0)
Monocytes Relative: 7 %
Neutro Abs: 6.5 10*3/uL (ref 1.7–7.7)
Neutrophils Relative %: 66 %

## 2021-01-07 LAB — PHOSPHORUS: Phosphorus: 3 mg/dL (ref 2.5–4.6)

## 2021-01-07 LAB — RESP PANEL BY RT-PCR (FLU A&B, COVID) ARPGX2
Influenza A by PCR: NEGATIVE
Influenza B by PCR: NEGATIVE
SARS Coronavirus 2 by RT PCR: NEGATIVE

## 2021-01-07 LAB — RAPID URINE DRUG SCREEN, HOSP PERFORMED
Amphetamines: NOT DETECTED
Barbiturates: NOT DETECTED
Benzodiazepines: NOT DETECTED
Cocaine: NOT DETECTED
Opiates: NOT DETECTED
Tetrahydrocannabinol: POSITIVE — AB

## 2021-01-07 LAB — CREATININE, SERUM
Creatinine, Ser: 1.16 mg/dL — ABNORMAL HIGH (ref 0.44–1.00)
GFR, Estimated: 56 mL/min — ABNORMAL LOW (ref >60)

## 2021-01-07 LAB — MAGNESIUM: Magnesium: 2.5 mg/dL — ABNORMAL HIGH (ref 1.7–2.4)

## 2021-01-07 MED ORDER — GABAPENTIN 300 MG PO CAPS
600.0000 mg | ORAL_CAPSULE | Freq: Three times a day (TID) | ORAL | Status: DC
  Administered 2021-01-07 – 2021-01-09 (×5): 600 mg via ORAL
  Filled 2021-01-07 (×5): qty 2

## 2021-01-07 MED ORDER — POTASSIUM CHLORIDE 10 MEQ/100ML IV SOLN
10.0000 meq | INTRAVENOUS | Status: AC
  Administered 2021-01-07 (×3): 10 meq via INTRAVENOUS
  Filled 2021-01-07 (×3): qty 100

## 2021-01-07 MED ORDER — POTASSIUM CHLORIDE 2 MEQ/ML IV SOLN
INTRAVENOUS | Status: DC
  Filled 2021-01-07 (×8): qty 1000

## 2021-01-07 MED ORDER — POTASSIUM CHLORIDE CRYS ER 20 MEQ PO TBCR
40.0000 meq | EXTENDED_RELEASE_TABLET | Freq: Once | ORAL | Status: AC
  Administered 2021-01-07: 40 meq via ORAL
  Filled 2021-01-07: qty 2

## 2021-01-07 MED ORDER — ACETAMINOPHEN 650 MG RE SUPP: 650.0000 mg | Freq: Four times a day (QID) | RECTAL | Status: DC | PRN

## 2021-01-07 MED ORDER — CYCLOBENZAPRINE HCL 10 MG PO TABS: 10.0000 mg | ORAL_TABLET | Freq: Three times a day (TID) | ORAL | Status: DC | PRN

## 2021-01-07 MED ORDER — ATORVASTATIN CALCIUM 20 MG PO TABS
20.0000 mg | ORAL_TABLET | Freq: Every day | ORAL | Status: DC
  Administered 2021-01-07 – 2021-01-09 (×3): 20 mg via ORAL
  Filled 2021-01-07 (×3): qty 1

## 2021-01-07 MED ORDER — PANTOPRAZOLE SODIUM 40 MG PO TBEC
40.0000 mg | DELAYED_RELEASE_TABLET | Freq: Every day | ORAL | Status: DC
  Administered 2021-01-07 – 2021-01-09 (×3): 40 mg via ORAL
  Filled 2021-01-07 (×3): qty 1

## 2021-01-07 MED ORDER — POTASSIUM CHLORIDE CRYS ER 20 MEQ PO TBCR
40.0000 meq | EXTENDED_RELEASE_TABLET | ORAL | Status: AC
  Administered 2021-01-07 – 2021-01-08 (×3): 40 meq via ORAL
  Filled 2021-01-07 (×3): qty 2

## 2021-01-07 MED ORDER — PRAMIPEXOLE DIHYDROCHLORIDE 1 MG PO TABS
0.5000 mg | ORAL_TABLET | Freq: Every day | ORAL | Status: DC
  Administered 2021-01-07 – 2021-01-08 (×2): 0.5 mg via ORAL
  Filled 2021-01-07 (×2): qty 1

## 2021-01-07 MED ORDER — TAMOXIFEN CITRATE 10 MG PO TABS
10.0000 mg | ORAL_TABLET | Freq: Every day | ORAL | Status: DC
  Administered 2021-01-07 – 2021-01-09 (×3): 10 mg via ORAL
  Filled 2021-01-07 (×3): qty 1

## 2021-01-07 MED ORDER — ACETAMINOPHEN 325 MG PO TABS
650.0000 mg | ORAL_TABLET | Freq: Four times a day (QID) | ORAL | Status: DC | PRN
  Administered 2021-01-09: 650 mg via ORAL
  Filled 2021-01-07: qty 2

## 2021-01-07 MED ORDER — ONDANSETRON HCL 4 MG/2ML IJ SOLN: 4.0000 mg | Freq: Four times a day (QID) | INTRAMUSCULAR | Status: DC | PRN

## 2021-01-07 MED ORDER — NICOTINE 21 MG/24HR TD PT24
21.0000 mg | MEDICATED_PATCH | Freq: Every day | TRANSDERMAL | Status: DC
  Administered 2021-01-07 – 2021-01-09 (×3): 21 mg via TRANSDERMAL
  Filled 2021-01-07 (×3): qty 1

## 2021-01-07 MED ORDER — VENLAFAXINE HCL ER 75 MG PO CP24
150.0000 mg | ORAL_CAPSULE | Freq: Every day | ORAL | Status: DC
  Administered 2021-01-08 – 2021-01-09 (×2): 150 mg via ORAL
  Filled 2021-01-07 (×2): qty 2

## 2021-01-07 MED ORDER — PRIMIDONE 50 MG PO TABS
50.0000 mg | ORAL_TABLET | Freq: Every day | ORAL | Status: DC
  Administered 2021-01-07 – 2021-01-09 (×3): 50 mg via ORAL
  Filled 2021-01-07 (×3): qty 1

## 2021-01-07 MED ORDER — POTASSIUM CHLORIDE 10 MEQ/100ML IV SOLN
10.0000 meq | INTRAVENOUS | Status: AC
  Administered 2021-01-07 – 2021-01-08 (×6): 10 meq via INTRAVENOUS
  Filled 2021-01-07 (×6): qty 100

## 2021-01-07 MED ORDER — SODIUM CHLORIDE 0.9% FLUSH
3.0000 mL | Freq: Once | INTRAVENOUS | Status: DC
Start: 2021-01-07 — End: 2021-01-09

## 2021-01-07 MED ORDER — ONDANSETRON HCL 4 MG PO TABS: 4.0000 mg | ORAL_TABLET | Freq: Four times a day (QID) | ORAL | Status: DC | PRN

## 2021-01-07 MED ORDER — ENOXAPARIN SODIUM 40 MG/0.4ML IJ SOSY
40.0000 mg | PREFILLED_SYRINGE | INTRAMUSCULAR | Status: DC
  Administered 2021-01-07 – 2021-01-08 (×2): 40 mg via SUBCUTANEOUS
  Filled 2021-01-07 (×2): qty 0.4
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
## 2021-01-07 NOTE — ED Notes (Signed)
Patient transported to MRI 

## 2021-01-07 NOTE — Progress Notes (Signed)
Pt arrived to room# 327 via stretcher from ED. Pt ambulatory with standby assist from stretcher to bed without difficulty. Pt oriented to room and safety procedures. Telemetry applied. VSS.

## 2021-01-07 NOTE — H&P (Signed)
History and Physical    Elizabeth Bullock OQH:[id number] DOB: [birth year] DOA: 01/07/2021  PCP: [name], PA-C  Patient coming from: Home  I have personally briefly reviewed patient's old medical records in [place]  Chief Complaint: Generalized weakness, slurred speech  HPI: Elizabeth Bullock is a 55 y.o. female with medical history significant of hypertension, was recently started on hydrochlorothiazide approximately month ago.  She developed increasing generalized weakness, difficulty holding things in her arm, difficulty walking over the past 5 to 6 days.  Over the past 4 days, her father noticed slurring of her speech.  This lasted approximately 2 days with a since began to improve.  She is also noted blurring of her vision over the past for several days.  She does not have any dysphagia.  She does not have any numbness or tingling.  She came to the ER for evaluation where she was noted to be severely hypokalemic with a potassium of less than 2.  Remainder of labs were relatively unrevealing.  Urinalysis also did not show any signs of infection.  Urine drug screen was positive for cannabis.  MRI of the brain did not show any evidence of infarct.  She has been referred for admission.   Review of Systems: As per HPI otherwise 10 point review of systems negative.    Past Medical History:  Diagnosis Date   Anxiety    Bipolar disorder ([place])    Breast cancer ([place])    radiation   Bronchitis, chronic ([facility])    COPD (chronic obstructive pulmonary disease) ([facility])    DDD (degenerative disc disease), cervical    DDD (degenerative disc disease), lumbar    Degenerative disorder of bone    OF BACK   Depression    GERD (gastroesophageal reflux disease)    Headache(784.0)    History of migraines,  takes propanolol for headaches   High cholesterol    Hypertension    Neuropathy    h/o   Personal history of radiation therapy    RLS (restless legs syndrome)    Tobacco abuse    Tremor      Past Surgical History:  Procedure Laterality Date   ABDOMINAL HYSTERECTOMY     ANKLE SURGERY Left    left ankle surgery as teenager per pt   BREAST LUMPECTOMY Left 03/15/2019   BREAST LUMPECTOMY WITH RADIOACTIVE SEED AND SENTINEL LYMPH NODE BIOPSY Left 03/15/2019   Procedure: LEFT BREAST LUMPECTOMY WITH RADIOACTIVE SEED AND AXILLARY SENTINEL LYMPH NODE BIOPSY;  Surgeon: [name], MD;  Location: [facility];  Service: General;  Laterality: Left;   CHOLECYSTECTOMY  03/13/2011   Procedure: LAPAROSCOPIC CHOLECYSTECTOMY;  Surgeon: Donato Heinz;  Location: [facility];  Service: General;  Laterality: N/A;    Social History:  reports that she has been smoking cigarettes. She has a 39.00 pack-year smoking history. She has never used smokeless tobacco. She reports current drug use. Drug: Marijuana. She reports that she does not drink alcohol.  Allergies  Allergen Reactions   Codeine Hives and Nausea And Vomiting   Morphine And Related Nausea Only   Penicillins Hives    Did it involve swelling of the face/tongue/throat, SOB, or low BP? No Did it involve sudden or severe rash/hives, skin peeling, or any reaction on the inside of your mouth or nose? No Did you need to seek medical attention at a hospital or doctor's office? No When did it last happen?      10+ years If all above answers  are "NO", may proceed with cephalosporin use.     Family History  Problem Relation Age of Onset   Hypertension Mother    Diabetes Mother    Asthma Mother    Hypertension Father    COPD Father    Heart attack Father    Hyperlipidemia Brother    Stomach cancer Maternal Uncle    Colon polyps Neg Hx    Colon cancer Neg Hx    Esophageal cancer Neg Hx     Prior to Admission medications   Medication Sig Start Date End Date Taking? Authorizing Provider  acetaminophen (TYLENOL) 500 MG tablet Take 1,000 mg by mouth 2 (two) times daily as needed for moderate pain or headache.    [provider]   albuterol (VENTOLIN HFA) 108 (90 Base) MCG/ACT inhaler Inhale 1 puff into the lungs daily as needed. 03/30/20   [provider]  atorvastatin (LIPITOR) 20 MG tablet Take 1 tablet (20 mg total) by mouth daily. 01/24/19   Soyla Dryer, PA-C  cyclobenzaprine (FLEXERIL) 10 MG tablet Take 10 mg by mouth 3 (three) times daily. 12/10/20   [provider]  fenofibrate (TRICOR) 145 MG tablet Take 145 mg by mouth daily. 12/11/20   [provider]  gabapentin (NEURONTIN) 600 MG tablet Take 600 mg by mouth 3 (three) times daily. 07/19/20   [provider]  hydrochlorothiazide (HYDRODIURIL) 25 MG tablet Take 1 tablet by mouth every morning. 10/11/20   [provider]  ibuprofen (ADVIL) 800 MG tablet Take 800 mg by mouth 3 (three) times daily. 08/05/20   [provider]  loratadine (CLARITIN) 10 MG tablet Take 10 mg by mouth daily as needed for allergies.     [provider]  Multiple Vitamin (MULTIVITAMIN WITH MINERALS) TABS tablet Take 1 tablet by mouth daily.    [provider]  omeprazole (PRILOSEC) 40 MG capsule Take 1 capsule by mouth every morning. 10/11/20   [provider]  pramipexole (MIRAPEX) 0.5 MG tablet Take 0.5 mg by mouth at bedtime.    [provider]  primidone (MYSOLINE) 50 MG tablet Take 50 mg by mouth daily. 01/02/21   [provider]  tamoxifen (NOLVADEX) 20 MG tablet Take 1/2 (one-half) tablet by mouth once daily 12/24/20   [name], MD  venlafaxine XR (EFFEXOR-XR) 150 MG 24 hr capsule Take 150 mg by mouth daily with breakfast.    [provider]    Physical Exam: Vitals:   01/07/21 1500 01/07/21 1557 01/07/21 1601 01/07/21 1618  BP: 102/65   107/60  Pulse: 74   68  Resp: (!) 29 20  16   Temp:   98.5 F (36.9 C) 97.6 F (36.4 C)  TempSrc:   Oral Oral  SpO2: 96%   100%  Weight:    76.5 kg  Height:    5\' 3"  (1.6 m)    Constitutional: NAD, calm, comfortable Eyes: PERRL,  lids and conjunctivae normal ENMT: Mucous membranes are moist. Posterior pharynx clear of any exudate or lesions.Normal dentition.  Neck: normal, supple, no masses, no thyromegaly Respiratory: clear to auscultation bilaterally, no wheezing, no crackles. Normal respiratory effort. No accessory muscle use.  Cardiovascular: Regular rate and rhythm, no murmurs / rubs / gallops. No extremity edema. 2+ pedal pulses. No carotid bruits.  Abdomen: no tenderness, no masses palpated. No hepatosplenomegaly. Bowel sounds positive.  Musculoskeletal: no clubbing / cyanosis. No joint deformity upper and lower extremities. Good ROM, no contractures. Normal muscle tone.  Skin: no  rashes, lesions, ulcers. No induration Neurologic: CN 2-12 grossly intact. Sensation intact, DTR normal. Strength 5/5 in all 4.  Psychiatric: Normal judgment and insight. Alert and oriented x 3. Normal mood.    Labs on Admission: I have personally reviewed following labs and imaging studies  CBC: Recent Labs  Lab 01/07/21 1034  WBC 9.7  NEUTROABS 6.5  HGB 13.6  HCT 37.2  MCV 84.4  PLT 378   Basic Metabolic Panel: Recent Labs  Lab 01/07/21 1125  NA 134*  K <2.0*  CL 103  CO2 19*  GLUCOSE 113*  BUN 20  CREATININE 1.13*  CALCIUM 8.5*  MG 2.5*  PHOS 3.0   GFR: Estimated Creatinine Clearance: 56.4 mL/min (A) (by C-G formula based on SCr of 1.13 mg/dL (H)). Liver Function Tests: Recent Labs  Lab 01/07/21 1125  AST 103*  ALT 61*  ALKPHOS 45  BILITOT 0.4  PROT 6.6  ALBUMIN 3.7   No results for input(s): LIPASE, AMYLASE in the last 168 hours. No results for input(s): AMMONIA in the last 168 hours. Coagulation Profile: No results for input(s): INR, PROTIME in the last 168 hours. Cardiac Enzymes: No results for input(s): CKTOTAL, CKMB, CKMBINDEX, TROPONINI in the last 168 hours. BNP (last 3 results) No results for input(s): PROBNP in the last 8760 hours. HbA1C: No results for input(s): HGBA1C in the last 72  hours. CBG: No results for input(s): GLUCAP in the last 168 hours. Lipid Profile: No results for input(s): CHOL, HDL, LDLCALC, TRIG, CHOLHDL, LDLDIRECT in the last 72 hours. Thyroid Function Tests: No results for input(s): TSH, T4TOTAL, FREET4, T3FREE, THYROIDAB in the last 72 hours. Anemia Panel: No results for input(s): VITAMINB12, FOLATE, FERRITIN, TIBC, IRON, RETICCTPCT in the last 72 hours. Urine analysis:    Component Value Date/Time   COLORURINE STRAW (A) 01/07/2021 1525   APPEARANCEUR CLEAR 01/07/2021 1525   LABSPEC 1.005 01/07/2021 1525   PHURINE 7.0 01/07/2021 1525   GLUCOSEU NEGATIVE 01/07/2021 1525   HGBUR MODERATE (A) 01/07/2021 1525   BILIRUBINUR NEGATIVE 01/07/2021 1525   KETONESUR NEGATIVE 01/07/2021 1525   PROTEINUR NEGATIVE 01/07/2021 1525   UROBILINOGEN 0.2 05/27/2014 0352   NITRITE NEGATIVE 01/07/2021 1525   LEUKOCYTESUR NEGATIVE 01/07/2021 1525    Radiological Exams on Admission: MR BRAIN WO CONTRAST  Result Date: 01/07/2021 CLINICAL DATA:  Altered mental status and weakness over the last 2 days. EXAM: MRI HEAD WITHOUT CONTRAST TECHNIQUE: Multiplanar, multiecho pulse sequences of the brain and surrounding structures were obtained without intravenous contrast. COMPARISON:  Head CT 06/06/2007 FINDINGS: Brain: The brain has a normal appearance without evidence of malformation, atrophy, old or acute small or large vessel infarction, mass lesion, hemorrhage, hydrocephalus or extra-axial collection. Vascular: Major vessels at the base of the brain show flow. Venous sinuses appear patent. Skull and upper cervical spine: Normal. Sinuses/Orbits: Clear/normal. Other: None significant. IMPRESSION: Normal examination. No abnormality seen to explain the presenting symptoms. Electronically Signed   By: Nelson Chimes M.D.   On: 01/07/2021 12:54    EKG: Independently reviewed.  Sinus rhythm  Assessment/Plan Active Problems:   Hypertension   Tobacco abuse   Malignant neoplasm  of upper-inner quadrant of left breast in female, estrogen receptor positive ([facility])   Hypokalemia   Generalized weakness     Severe hypokalemia -Possibly related to recently started thiazide diuretic -She has not had any recent GI symptoms including vomiting and diarrhea -Reports that p.o. intake has been normal for her. -Continue with aggressive replacement -Magnesium noted to be  normal  Generalized weakness/slurring of speech -Suspect is related to hypokalemia -MRI brain negative for infarct -We will request physical therapy  Hypertension -Hold hydrochlorothiazide -Monitor blood pressure for now  History of breast cancer -Status post radiation therapy -Continue on tamoxifen  Tobacco abuse -Nicotine patch  DVT prophylaxis: Lovenox Code Status: Full code Family Communication: Discussed with father at the bedside Disposition Plan: Discharge home once electrolytes have been corrected Consults called:   Admission status: Observation, telemetry  Kathie Dike MD [facility]   If 7PM-7AM, please contact night-coverage [email or web address]   01/07/2021, 6:03 PM
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
## 2021-01-08 DIAGNOSIS — Z885 Allergy status to narcotic agent status: Secondary | ICD-10-CM | POA: Diagnosis not present

## 2021-01-08 DIAGNOSIS — Z833 Family history of diabetes mellitus: Secondary | ICD-10-CM | POA: Diagnosis not present

## 2021-01-08 DIAGNOSIS — Z853 Personal history of malignant neoplasm of breast: Secondary | ICD-10-CM | POA: Diagnosis not present

## 2021-01-08 DIAGNOSIS — Z79810 Long term (current) use of selective estrogen receptor modulators (SERMs): Secondary | ICD-10-CM | POA: Diagnosis not present

## 2021-01-08 DIAGNOSIS — R7989 Other specified abnormal findings of blood chemistry: Secondary | ICD-10-CM

## 2021-01-08 DIAGNOSIS — R4781 Slurred speech: Secondary | ICD-10-CM | POA: Diagnosis present

## 2021-01-08 DIAGNOSIS — K219 Gastro-esophageal reflux disease without esophagitis: Secondary | ICD-10-CM | POA: Diagnosis present

## 2021-01-08 DIAGNOSIS — E876 Hypokalemia: Secondary | ICD-10-CM | POA: Diagnosis present

## 2021-01-08 DIAGNOSIS — Z800 Family history of malignant neoplasm of digestive organs: Secondary | ICD-10-CM | POA: Diagnosis not present

## 2021-01-08 DIAGNOSIS — Z8249 Family history of ischemic heart disease and other diseases of the circulatory system: Secondary | ICD-10-CM | POA: Diagnosis not present

## 2021-01-08 DIAGNOSIS — J449 Chronic obstructive pulmonary disease, unspecified: Secondary | ICD-10-CM | POA: Diagnosis present

## 2021-01-08 DIAGNOSIS — Z923 Personal history of irradiation: Secondary | ICD-10-CM | POA: Diagnosis not present

## 2021-01-08 DIAGNOSIS — Z825 Family history of asthma and other chronic lower respiratory diseases: Secondary | ICD-10-CM | POA: Diagnosis not present

## 2021-01-08 DIAGNOSIS — Z20822 Contact with and (suspected) exposure to covid-19: Secondary | ICD-10-CM | POA: Diagnosis present

## 2021-01-08 DIAGNOSIS — R531 Weakness: Secondary | ICD-10-CM | POA: Diagnosis present

## 2021-01-08 DIAGNOSIS — I10 Essential (primary) hypertension: Secondary | ICD-10-CM | POA: Diagnosis present

## 2021-01-08 DIAGNOSIS — M5030 Other cervical disc degeneration, unspecified cervical region: Secondary | ICD-10-CM | POA: Diagnosis present

## 2021-01-08 DIAGNOSIS — Z83438 Family history of other disorder of lipoprotein metabolism and other lipidemia: Secondary | ICD-10-CM | POA: Diagnosis not present

## 2021-01-08 DIAGNOSIS — Z170 Estrogen receptor positive status [ER+]: Secondary | ICD-10-CM | POA: Diagnosis not present

## 2021-01-08 DIAGNOSIS — Z90721 Acquired absence of ovaries, unilateral: Secondary | ICD-10-CM | POA: Diagnosis not present

## 2021-01-08 DIAGNOSIS — Z880 Allergy status to penicillin: Secondary | ICD-10-CM | POA: Diagnosis not present

## 2021-01-08 DIAGNOSIS — G2581 Restless legs syndrome: Secondary | ICD-10-CM | POA: Diagnosis present

## 2021-01-08 DIAGNOSIS — F17210 Nicotine dependence, cigarettes, uncomplicated: Secondary | ICD-10-CM | POA: Diagnosis present

## 2021-01-08 DIAGNOSIS — F319 Bipolar disorder, unspecified: Secondary | ICD-10-CM | POA: Diagnosis present

## 2021-01-08 DIAGNOSIS — C50212 Malignant neoplasm of upper-inner quadrant of left female breast: Secondary | ICD-10-CM | POA: Diagnosis not present

## 2021-01-08 LAB — COMPREHENSIVE METABOLIC PANEL
ALT: 65 U/L — ABNORMAL HIGH (ref 0–44)
AST: 103 U/L — ABNORMAL HIGH (ref 15–41)
Albumin: 3.5 g/dL (ref 3.5–5.0)
Alkaline Phosphatase: 39 U/L (ref 38–126)
Anion gap: 10 (ref 5–15)
BUN: 14 mg/dL (ref 6–20)
CO2: 18 mmol/L — ABNORMAL LOW (ref 22–32)
Calcium: 8.3 mg/dL — ABNORMAL LOW (ref 8.9–10.3)
Chloride: 108 mmol/L (ref 98–111)
Creatinine, Ser: 1.01 mg/dL — ABNORMAL HIGH (ref 0.44–1.00)
GFR, Estimated: >60 mL/min (ref >60)
Glucose, Bld: 111 mg/dL — ABNORMAL HIGH (ref 70–99)
Potassium: <2 mmol/L — CL (ref 3.5–5.1)
Sodium: 136 mmol/L (ref 135–145)
Total Bilirubin: 0.5 mg/dL (ref 0.3–1.2)
Total Protein: 6 g/dL — ABNORMAL LOW (ref 6.5–8.1)

## 2021-01-08 LAB — CBC
HCT: 34.2 % — ABNORMAL LOW (ref 36.0–46.0)
Hemoglobin: 12.1 g/dL (ref 12.0–15.0)
MCH: 30 pg (ref 26.0–34.0)
MCHC: 35.4 g/dL (ref 30.0–36.0)
MCV: 84.9 fL (ref 80.0–100.0)
Platelets: 355 10*3/uL (ref 150–400)
RBC: 4.03 MIL/uL (ref 3.87–5.11)
RDW: 14.7 % (ref 11.5–15.5)
WBC: 7.2 10*3/uL (ref 4.0–10.5)
nRBC: 0 % (ref 0.0–0.2)

## 2021-01-08 LAB — NA AND K (SODIUM & POTASSIUM), RAND UR
Potassium Urine: 22 mmol/L
Sodium, Ur: 64 mmol/L

## 2021-01-08 LAB — HIV ANTIBODY (ROUTINE TESTING W REFLEX): HIV Screen 4th Generation wRfx: NONREACTIVE

## 2021-01-08 LAB — POTASSIUM: Potassium: 2.7 mmol/L — CL (ref 3.5–5.1)

## 2021-01-08 MED ORDER — POTASSIUM CHLORIDE 10 MEQ/100ML IV SOLN
10.0000 meq | INTRAVENOUS | Status: AC
  Administered 2021-01-08 (×4): 10 meq via INTRAVENOUS
  Filled 2021-01-08 (×4): qty 100

## 2021-01-08 MED ORDER — POTASSIUM CHLORIDE 10 MEQ/100ML IV SOLN
10.0000 meq | INTRAVENOUS | Status: AC
  Administered 2021-01-08 (×6): 10 meq via INTRAVENOUS
  Filled 2021-01-08 (×6): qty 100

## 2021-01-08 MED ORDER — POTASSIUM CHLORIDE CRYS ER 20 MEQ PO TBCR
40.0000 meq | EXTENDED_RELEASE_TABLET | ORAL | Status: AC
  Administered 2021-01-08 (×2): 40 meq via ORAL
  Filled 2021-01-08 (×2): qty 2

## 2021-01-08 MED ORDER — POTASSIUM CHLORIDE CRYS ER 20 MEQ PO TBCR
40.0000 meq | EXTENDED_RELEASE_TABLET | ORAL | Status: AC
  Administered 2021-01-08 (×3): 40 meq via ORAL
  Filled 2021-01-08 (×3): qty 2

## 2021-01-08 NOTE — Progress Notes (Signed)
Date and time results received: 01/08/21 1848 (use smartphrase ".now" to insert current time)  Test: K+ 2.7 Critical Value: K+2.7   Name of Provider Notified: Memon   Orders Received? Or Actions Taken?: Orders Received - See Orders for details

## 2021-01-08 NOTE — Progress Notes (Signed)
PROGRESS NOTE    Elizabeth Bullock  OHY:[id number] DOB: 06/30/1966 DOA: 01/07/2021 PCP: [name], PA-C    Brief Narrative:  55 year old female with a history of hypertension, started on hydrochlorothiazide approximately 1 month ago, admitted to the hospital with generalized weakness, difficulty holding things in her hand, slurring of her speech.  She was found to have severe hypokalemia with potassium less than 2.  MRI brain negative for infarct.  She was admitted for further treatments.   Assessment & Plan:   Active Problems:   Hypertension   Tobacco abuse   Malignant neoplasm of upper-inner quadrant of left breast in female, estrogen receptor positive (HCC)   Hypokalemia   Generalized weakness   Severe hypokalemia -Possibly related to recently started thiazide diuretic -Check urine potassium levels -She has not had any recent GI symptoms including vomiting and diarrhea -Reports that p.o. intake has been normal for her. -Continue with aggressive replacement -Magnesium noted to be normal   Generalized weakness/slurring of speech -Suspect is related to hypokalemia -MRI brain negative for infarct -Seen by physical therapy with recommendation for outpatient PT  Elevated liver function tests -Mild elevation in LFTs -Add viral hepatitis panel to a.m. labs -Check right upper quadrant ultrasound   Hypertension -Hold hydrochlorothiazide -Monitor blood pressure for now   History of breast cancer -Status post radiation therapy -Continue on tamoxifen   Tobacco abuse -Nicotine patch   DVT prophylaxis: enoxaparin (LOVENOX) injection 40 mg Start: 01/07/21 2000  Code Status: full code Family Communication: discussed with patient Disposition Plan: Status is: Inpatient  Remains inpatient appropriate because:Persistent severe electrolyte disturbances  Dispo: The patient is from: Home              Anticipated d/c is to: Home              Patient currently is not medically  stable to d/c.   Difficult to place patient No         Consultants:    Procedures:    Antimicrobials:      Subjective: Overall she is feeling better.  Feels that weakness in her arms and legs is better today.  She did describe tingling in her face bilaterally.  Objective: Vitals:   01/07/21 1941 01/08/21 0019 01/08/21 0338 01/08/21 1354  BP: 121/64 (!) 121/110 120/67 127/84  Pulse: 77 83 83 88  Resp: 19 18 18    Temp: 98 F (36.7 C) 97.8 F (36.6 C) 98.3 F (36.8 C) 98.8 F (37.1 C)  TempSrc: Oral Oral Oral Oral  SpO2: 100% 100% 100% 100%  Weight:      Height:        Intake/Output Summary (Last 24 hours) at 01/08/2021 1810 Last data filed at 01/08/2021 1300 Gross per 24 hour  Intake 3392.35 ml  Output --  Net 3392.35 ml   Filed Weights   01/07/21 0911 01/07/21 1618  Weight: 80.3 kg 76.5 kg    Examination:  General exam: Appears calm and comfortable  Respiratory system: Clear to auscultation. Respiratory effort normal. Cardiovascular system: S1 & S2 heard, RRR. No JVD, murmurs, rubs, gallops or clicks. No pedal edema. Gastrointestinal system: Abdomen is nondistended, soft and nontender. No organomegaly or masses felt. Normal bowel sounds heard. Central nervous system: Alert and oriented. No focal neurological deficits. Extremities: Symmetric 5 x 5 power. Skin: No rashes, lesions or ulcers Psychiatry: Judgement and insight appear normal. Mood & affect appropriate.     Data Reviewed: I have personally reviewed following labs and imaging  studies  CBC: Recent Labs  Lab 01/07/21 1034 01/07/21 1901 01/08/21 0615  WBC 9.7 8.7 7.2  NEUTROABS 6.5  --   --   HGB 13.6 12.9 12.1  HCT 37.2 35.7* 34.2*  MCV 84.4 85.8 84.9  PLT 382 359 242   Basic Metabolic Panel: Recent Labs  Lab 01/07/21 1125 01/07/21 1901 01/08/21 0615  NA 134*  --  136  K <2.0*  --  <2.0*  CL 103  --  108  CO2 19*  --  18*  GLUCOSE 113*  --  111*  BUN 20  --  14  CREATININE  1.13* 1.16* 1.01*  CALCIUM 8.5*  --  8.3*  MG 2.5*  --   --   PHOS 3.0  --   --    GFR: Estimated Creatinine Clearance: 63 mL/min (A) (by C-G formula based on SCr of 1.01 mg/dL (H)). Liver Function Tests: Recent Labs  Lab 01/07/21 1125 01/08/21 0615  AST 103* 103*  ALT 61* 65*  ALKPHOS 45 39  BILITOT 0.4 0.5  PROT 6.6 6.0*  ALBUMIN 3.7 3.5   No results for input(s): LIPASE, AMYLASE in the last 168 hours. No results for input(s): AMMONIA in the last 168 hours. Coagulation Profile: No results for input(s): INR, PROTIME in the last 168 hours. Cardiac Enzymes: No results for input(s): CKTOTAL, CKMB, CKMBINDEX, TROPONINI in the last 168 hours. BNP (last 3 results) No results for input(s): PROBNP in the last 8760 hours. HbA1C: No results for input(s): HGBA1C in the last 72 hours. CBG: No results for input(s): GLUCAP in the last 168 hours. Lipid Profile: No results for input(s): CHOL, HDL, LDLCALC, TRIG, CHOLHDL, LDLDIRECT in the last 72 hours. Thyroid Function Tests: No results for input(s): TSH, T4TOTAL, FREET4, T3FREE, THYROIDAB in the last 72 hours. Anemia Panel: No results for input(s): VITAMINB12, FOLATE, FERRITIN, TIBC, IRON, RETICCTPCT in the last 72 hours. Sepsis Labs: No results for input(s): PROCALCITON, LATICACIDVEN in the last 168 hours.  Recent Results (from the past 240 hour(s))  Resp Panel by RT-PCR (Flu A&B, Covid) Nasopharyngeal Swab     Status: None   Collection Time: 01/07/21  2:09 PM   Specimen: Nasopharyngeal Swab; Nasopharyngeal(NP) swabs in vial transport medium  Result Value Ref Range Status   SARS Coronavirus 2 by RT PCR NEGATIVE NEGATIVE Final    Comment: (NOTE) SARS-CoV-2 target nucleic acids are NOT DETECTED.  The SARS-CoV-2 RNA is generally detectable in upper respiratory specimens during the acute phase of infection. The lowest concentration of SARS-CoV-2 viral copies this assay can detect is 138 copies/mL. A negative result does not  preclude SARS-Cov-2 infection and should not be used as the sole basis for treatment or other patient management decisions. A negative result may occur with  improper specimen collection/handling, submission of specimen other than nasopharyngeal swab, presence of viral mutation(s) within the areas targeted by this assay, and inadequate number of viral copies(<138 copies/mL). A negative result must be combined with clinical observations, patient history, and epidemiological information. The expected result is Negative.  Fact Sheet for Patients:  EntrepreneurPulse.com.au  Fact Sheet for Healthcare Providers:  IncredibleEmployment.be  This test is no t yet approved or cleared by the [place] FDA and  has been authorized for detection and/or diagnosis of SARS-CoV-2 by FDA under an Emergency Use Authorization (EUA). This EUA will remain  in effect (meaning this test can be used) for the duration of the COVID-19 declaration under Section 564(b)(1) of the Act, 21 U.S.C.section 360bbb-3(b)(1),  unless the authorization is terminated  or revoked sooner.       Influenza A by PCR NEGATIVE NEGATIVE Final   Influenza B by PCR NEGATIVE NEGATIVE Final    Comment: (NOTE) The Xpert Xpress SARS-CoV-2/FLU/RSV plus assay is intended as an aid in the diagnosis of influenza from Nasopharyngeal swab specimens and should not be used as a sole basis for treatment. Nasal washings and aspirates are unacceptable for Xpert Xpress SARS-CoV-2/FLU/RSV testing.  Fact Sheet for Patients: EntrepreneurPulse.com.au  Fact Sheet for Healthcare Providers: IncredibleEmployment.be  This test is not yet approved or cleared by the [place] FDA and has been authorized for detection and/or diagnosis of SARS-CoV-2 by FDA under an Emergency Use Authorization (EUA). This EUA will remain in effect (meaning this test can be used) for the  duration of the COVID-19 declaration under Section 564(b)(1) of the Act, 21 U.S.C. section 360bbb-3(b)(1), unless the authorization is terminated or revoked.  Performed at [facility], [address], [place], [place] [address]          Radiology Studies: MR BRAIN WO CONTRAST  Result Date: 01/07/2021 CLINICAL DATA:  Altered mental status and weakness over the last 2 days. EXAM: MRI HEAD WITHOUT CONTRAST TECHNIQUE: Multiplanar, multiecho pulse sequences of the brain and surrounding structures were obtained without intravenous contrast. COMPARISON:  Head CT 06/06/2007 FINDINGS: Brain: The brain has a normal appearance without evidence of malformation, atrophy, old or acute small or large vessel infarction, mass lesion, hemorrhage, hydrocephalus or extra-axial collection. Vascular: Major vessels at the base of the brain show flow. Venous sinuses appear patent. Skull and upper cervical spine: Normal. Sinuses/Orbits: Clear/normal. Other: None significant. IMPRESSION: Normal examination. No abnormality seen to explain the presenting symptoms. Electronically Signed   By: Nelson Chimes M.D.   On: 01/07/2021 12:54        Scheduled Meds:  atorvastatin  20 mg Oral Daily   enoxaparin (LOVENOX) injection  40 mg Subcutaneous Q24H   gabapentin  600 mg Oral TID   nicotine  21 mg Transdermal Daily   pantoprazole  40 mg Oral Daily   potassium chloride  40 mEq Oral Q4H   pramipexole  0.5 mg Oral QHS   primidone  50 mg Oral Daily   sodium chloride flush  3 mL Intravenous Once   tamoxifen  10 mg Oral Daily   venlafaxine XR  150 mg Oral Q breakfast   Continuous Infusions:  lactated ringers with kcl 100 mL/hr at 01/08/21 1444     LOS: 0 days    Time spent: 46mins    Kathie Dike, MD [facility]   If 7PM-7AM, please contact night-coverage [email or web address]  01/08/2021, 6:10 PM

## 2021-01-08 NOTE — Clinical Social Work Note (Signed)
PT recommends OPPT and patient is agreeable to OPPT. Patient is already active with OPPT at [facility] at [facility].    Connelly [place], Clydene Pugh, LCSW

## 2021-01-08 NOTE — Progress Notes (Signed)
Date and time results received: 01/08/21 0715   (use smartphrase ".now" to insert current time)  Test: K+ less than 2  Critical Value: K+ less than 2   Name of Provider Notified: Memon   Orders Received? Or Actions Taken?: Actions Taken: orders given see chart

## 2021-01-08 NOTE — Evaluation (Addendum)
Physical Therapy Evaluation Patient Details Name: Elizabeth Bullock MRN: [id number] DOB: [birth year] Today's Date: 01/08/2021   History of Present Illness  Elizabeth Bullock is a 55 y.o. female with medical history significant of hypertension, was recently started on hydrochlorothiazide approximately month ago.  She developed increasing generalized weakness, difficulty holding things in her arm, difficulty walking over the past 5 to 6 days.  Over the past 4 days, her father noticed slurring of her speech.  This lasted approximately 2 days with a since began to improve.  She is also noted blurring of her vision over the past for several days.  She does not have any dysphagia.  She does not have any numbness or tingling.  She came to the ER for evaluation where she was noted to be severely hypokalemic with a potassium of less than 2.  Remainder of labs were relatively unrevealing.  Urinalysis also did not show any signs of infection.  Urine drug screen was positive for cannabis.  MRI of the brain did not show any evidence of infarct.  She has been referred for admission.   Clinical Impression  Patient presents sitting up in chair and agreeable to therapy, patient demonstrates independence with bed mobility. Patient is independent with transfers and ambulation without AD. Patient able to negotiate level, incline and decline surfaces during ambulation without loss of balance. Patient discharged to care of nursing for ambulation daily as tolerated for length of stay.     Follow Up Recommendations Outpatient PT (is currently going to OP for back pain)    Equipment Recommendations  None recommended by PT    Recommendations for Other Services       Precautions / Restrictions Precautions Precautions: None Restrictions Weight Bearing Restrictions: No      Mobility  Bed Mobility Overal bed mobility: Independent               Patient Response: Cooperative  Transfers Overall transfer level:  Independent Equipment used: None                Ambulation/Gait Ambulation/Gait assistance: Independent Gait Distance (Feet): 200 Feet Assistive device: None Gait Pattern/deviations: WFL(Within Functional Limits) Gait velocity: normal   General Gait Details: Patient able to negotiate level, incline and decline surfaces  Stairs            Wheelchair Mobility    Modified Rankin (Stroke Patients Only)       Balance Overall balance assessment: Independent                                           Pertinent Vitals/Pain Pain Assessment: No/denies pain    Home Living Family/patient expects to be discharged to:: Private residence Living Arrangements: Parent Available Help at Discharge: Family;Available 24 hours/day Type of Home: House Home Access: Ramped entrance     Home Layout: One level Home Equipment: Walker - 4 wheels;Cane - quad;Grab bars - tub/shower;Shower seat      Prior Function Level of Independence: Independent         Comments: Patient is independent of all ADLs     Hand Dominance        Extremity/Trunk Assessment   Upper Extremity Assessment Upper Extremity Assessment: Overall WFL for tasks assessed    Lower Extremity Assessment Lower Extremity Assessment: Overall WFL for tasks assessed    Cervical / Trunk Assessment Cervical / Trunk Assessment: Normal  Communication   Communication: No difficulties  Cognition Arousal/Alertness: Awake/alert Behavior During Therapy: WFL for tasks assessed/performed Overall Cognitive Status: Within Functional Limits for tasks assessed                                        General Comments      Exercises     Assessment/Plan    PT Assessment Patent does not need any further PT services  PT Problem List         PT Treatment Interventions      PT Goals (Current goals can be found in the Care Plan section)  Acute Rehab PT Goals Patient Stated Goal:  return home PT Goal Formulation: With patient Time For Goal Achievement: 01/22/21 Potential to Achieve Goals: Good    Frequency     Barriers to discharge        Co-evaluation               AM-PAC PT "6 Clicks" Mobility  Outcome Measure Help needed turning from your back to your side while in a flat bed without using bedrails?: None Help needed moving from lying on your back to sitting on the side of a flat bed without using bedrails?: None Help needed moving to and from a bed to a chair (including a wheelchair)?: None Help needed standing up from a chair using your arms (e.g., wheelchair or bedside chair)?: None Help needed to walk in hospital room?: None Help needed climbing 3-5 steps with a railing? : None 6 Click Score: 24    End of Session   Activity Tolerance: Patient tolerated treatment well   Nurse Communication: Mobility status PT Visit Diagnosis: Unsteadiness on feet (R26.81);Other abnormalities of gait and mobility (R26.89);Muscle weakness (generalized) (M62.81)    Time: 0355-9741 PT Time Calculation (min) (ACUTE ONLY): 10 min   Charges:   PT Evaluation $PT Eval Low Complexity: 1 Low PT Treatments $Gait Training: 8-22 mins      11:48 AM, 01/08/21 Sinclair Ship SPT  11:48 AM, 01/08/21 Lonell Grandchild, MPT Physical Therapist with [facility] [phone] office [phone] mobile phone

## 2021-01-08 NOTE — Plan of Care (Signed)
  Problem: Education: Goal: Knowledge of General Education information will improve Description Including pain rating scale, medication(s)/side effects and non-pharmacologic comfort measures Outcome: Progressing   Problem: Health Behavior/Discharge Planning: Goal: Ability to manage health-related needs will improve Outcome: Progressing   

## 2021-01-09 ENCOUNTER — Inpatient Hospital Stay (HOSPITAL_COMMUNITY): Payer: Medicaid Other

## 2021-01-09 ENCOUNTER — Ambulatory Visit (HOSPITAL_COMMUNITY): Payer: Medicaid Other

## 2021-01-09 ENCOUNTER — Telehealth (HOSPITAL_COMMUNITY): Payer: Self-pay

## 2021-01-09 DIAGNOSIS — I10 Essential (primary) hypertension: Secondary | ICD-10-CM

## 2021-01-09 DIAGNOSIS — R531 Weakness: Secondary | ICD-10-CM

## 2021-01-09 DIAGNOSIS — C50212 Malignant neoplasm of upper-inner quadrant of left female breast: Secondary | ICD-10-CM

## 2021-01-09 DIAGNOSIS — Z720 Tobacco use: Secondary | ICD-10-CM

## 2021-01-09 DIAGNOSIS — Z170 Estrogen receptor positive status [ER+]: Secondary | ICD-10-CM

## 2021-01-09 DIAGNOSIS — E876 Hypokalemia: Principal | ICD-10-CM

## 2021-01-09 LAB — HEPATITIS PANEL, ACUTE
HCV Ab: NONREACTIVE
Hep A IgM: NONREACTIVE
Hep B C IgM: NONREACTIVE
Hepatitis B Surface Ag: NONREACTIVE

## 2021-01-09 LAB — MAGNESIUM: Magnesium: 2 mg/dL (ref 1.7–2.4)

## 2021-01-09 LAB — COMPREHENSIVE METABOLIC PANEL
ALT: 62 U/L — ABNORMAL HIGH (ref 0–44)
AST: 93 U/L — ABNORMAL HIGH (ref 15–41)
Albumin: 3 g/dL — ABNORMAL LOW (ref 3.5–5.0)
Alkaline Phosphatase: 35 U/L — ABNORMAL LOW (ref 38–126)
Anion gap: 7 (ref 5–15)
BUN: 9 mg/dL (ref 6–20)
CO2: 15 mmol/L — ABNORMAL LOW (ref 22–32)
Calcium: 8.1 mg/dL — ABNORMAL LOW (ref 8.9–10.3)
Chloride: 118 mmol/L — ABNORMAL HIGH (ref 98–111)
Creatinine, Ser: 0.82 mg/dL (ref 0.44–1.00)
GFR, Estimated: >60 mL/min (ref >60)
Glucose, Bld: 101 mg/dL — ABNORMAL HIGH (ref 70–99)
Potassium: 4.4 mmol/L (ref 3.5–5.1)
Sodium: 140 mmol/L (ref 135–145)
Total Bilirubin: 0.4 mg/dL (ref 0.3–1.2)
Total Protein: 5.4 g/dL — ABNORMAL LOW (ref 6.5–8.1)

## 2021-01-09 MED ORDER — IBUPROFEN 800 MG PO TABS: 800.0000 mg | ORAL_TABLET | Freq: Four times a day (QID) | ORAL | Status: DC | PRN

## 2021-01-09 NOTE — Plan of Care (Signed)

## 2021-01-09 NOTE — Telephone Encounter (Signed)
Called pt regarding recent hospitalization to see if able to come to therapy today.  Pt stated she is getting discharged later today.  Pt wishes to cancel apt scheduled for today, reminded next apt date and time wiht contact information given if needs to cancel/reschedule in the future.    Ihor Austin, LPTA/CLT; Delana Meyer [phone]

## 2021-01-09 NOTE — Plan of Care (Signed)
  Problem: Education: Goal: Knowledge of General Education information will improve Description: Including pain rating scale, medication(s)/side effects and non-pharmacologic comfort measures 01/09/2021 1037 by Melony Overly, RN Outcome: Adequate for Discharge 01/09/2021 0915 by Melony Overly, RN Outcome: Progressing   Problem: Health Behavior/Discharge Planning: Goal: Ability to manage health-related needs will improve 01/09/2021 1037 by Melony Overly, RN Outcome: Adequate for Discharge 01/09/2021 0915 by Melony Overly, RN Outcome: Progressing   Problem: Clinical Measurements: Goal: Ability to maintain clinical measurements within normal limits will improve 01/09/2021 1037 by Melony Overly, RN Outcome: Adequate for Discharge 01/09/2021 0915 by Melony Overly, RN Outcome: Progressing Goal: Will remain free from infection 01/09/2021 1037 by Melony Overly, RN Outcome: Adequate for Discharge 01/09/2021 0915 by Melony Overly, RN Outcome: Progressing Goal: Diagnostic test results will improve 01/09/2021 1037 by Melony Overly, RN Outcome: Adequate for Discharge 01/09/2021 0915 by Melony Overly, RN Outcome: Progressing Goal: Respiratory complications will improve 01/09/2021 1037 by Melony Overly, RN Outcome: Adequate for Discharge 01/09/2021 0915 by Melony Overly, RN Outcome: Progressing Goal: Cardiovascular complication will be avoided 01/09/2021 1037 by Melony Overly, RN Outcome: Adequate for Discharge 01/09/2021 0915 by Melony Overly, RN Outcome: Progressing   Problem: Activity: Goal: Risk for activity intolerance will decrease 01/09/2021 1037 by Melony Overly, RN Outcome: Adequate for Discharge 01/09/2021 0915 by Melony Overly, RN Outcome: Progressing   Problem: Nutrition: Goal: Adequate nutrition will be maintained 01/09/2021 1037 by Melony Overly, RN Outcome: Adequate for Discharge 01/09/2021 0915 by Melony Overly, RN Outcome: Progressing    Problem: Coping: Goal: Level of anxiety will decrease 01/09/2021 1037 by Melony Overly, RN Outcome: Adequate for Discharge 01/09/2021 0915 by Melony Overly, RN Outcome: Progressing   Problem: Elimination: Goal: Will not experience complications related to bowel motility 01/09/2021 1037 by Melony Overly, RN Outcome: Adequate for Discharge 01/09/2021 0915 by Melony Overly, RN Outcome: Progressing Goal: Will not experience complications related to urinary retention 01/09/2021 1037 by Melony Overly, RN Outcome: Adequate for Discharge 01/09/2021 0915 by Melony Overly, RN Outcome: Progressing   Problem: Pain Managment: Goal: General experience of comfort will improve 01/09/2021 1037 by Melony Overly, RN Outcome: Adequate for Discharge 01/09/2021 0915 by Melony Overly, RN Outcome: Progressing   Problem: Safety: Goal: Ability to remain free from injury will improve 01/09/2021 1037 by Melony Overly, RN Outcome: Adequate for Discharge 01/09/2021 0915 by Melony Overly, RN Outcome: Progressing   Problem: Skin Integrity: Goal: Risk for impaired skin integrity will decrease 01/09/2021 1037 by Melony Overly, RN Outcome: Adequate for Discharge 01/09/2021 0915 by Melony Overly, RN Outcome: Progressing

## 2021-01-09 NOTE — Discharge Summary (Signed)
Physician Discharge Summary  Elizabeth Bullock OBS:[id number] DOB: 05-14-1966 DOA: 01/07/2021  PCP: [name], PA-C  Admit date: 01/07/2021 Discharge date: 01/09/2021  Admitted From: Home Disposition: Home   Recommendations for Outpatient Follow-up:  Follow up with PCP within the next week with repeat BMP and magnesium level. Will also need to monitor BP and consider treating. HCTZ stopped due to severe hypokalemia and normotensive on discharge.   Home Health: Continue outpatient PT Equipment/Devices: None new Discharge Condition: Stable CODE STATUS: Full Diet recommendation: Heart healthy  Brief/Interim Summary: 55 year old female with a history of hypertension, started on hydrochlorothiazide approximately 1 month ago, admitted to the hospital with generalized weakness, difficulty holding things in her hand, slurring of her speech.  She was found to have severe hypokalemia with potassium less than 2.  MRI brain negative for infarct.  She was given replacement electrolytes with significant improvement and is stable for discharge.   Discharge Diagnoses:  Active Problems:   Hypertension   Tobacco abuse   Malignant neoplasm of upper-inner quadrant of left breast in female, estrogen receptor positive (HCC)   Hypokalemia   Generalized weakness  Severe hypokalemia - Improved, stop HCTZ.   Generalized weakness/slurring of speech -Suspect is related to hypokalemia, resolved. -MRI brain negative for infarct -Seen by physical therapy with recommendation for outpatient PT   Elevated liver function tests -Mild elevation in LFTs -Add viral hepatitis panel to a.m. labs -Check right upper quadrant ultrasound   Hypertension -Hold hydrochlorothiazide -Monitor blood pressure for now   History of breast cancer -Status post radiation therapy -Continue on tamoxifen   Tobacco abuse -Nicotine patch  Discharge Instructions Discharge Instructions     Ambulatory referral to Physical  Therapy   Complete by: As directed    Diet - low sodium heart healthy   Complete by: As directed    Discharge instructions   Complete by: As directed    You were evaluated for slurred speech and unsteady gait with a negative MRI of the brain, found to have severely low potassium which has improved with replacement and holding hydrochlorothiazide (HCTZ). Continue to hold HCTZ and follow up with repeat lab testing with your PCP in the next few days if possible. If diarrhea worsens, you become nauseated and unable to take adequate fluids, develop vomiting, abdominal pain or fever, you will need to seek medical attention sooner. If your symptoms return, seek medical attention right away.   Increase activity slowly   Complete by: As directed       Allergies as of 01/09/2021       Reactions   Codeine Hives, Nausea And Vomiting   Morphine And Related Nausea Only   Penicillins Hives   Did it involve swelling of the face/tongue/throat, SOB, or low BP? No Did it involve sudden or severe rash/hives, skin peeling, or any reaction on the inside of your mouth or nose? No Did you need to seek medical attention at a hospital or doctor's office? No When did it last happen?      10+ years If all above answers are "NO", may proceed with cephalosporin use.        Medication List     STOP taking these medications    hydrochlorothiazide 25 MG tablet Commonly known as: HYDRODIURIL       TAKE these medications    acetaminophen 500 MG tablet Commonly known as: TYLENOL Take 1,000 mg by mouth 2 (two) times daily as needed for moderate pain or headache.   albuterol  108 (90 Base) MCG/ACT inhaler Commonly known as: VENTOLIN HFA Inhale 2 puffs into the lungs every 4 (four) hours as needed for wheezing or shortness of breath.   atorvastatin 20 MG tablet Commonly known as: LIPITOR Take 1 tablet (20 mg total) by mouth daily.   cyclobenzaprine 10 MG tablet Commonly known as: FLEXERIL Take 10 mg by  mouth daily.   fenofibrate 145 MG tablet Commonly known as: TRICOR Take 145 mg by mouth daily.   gabapentin 600 MG tablet Commonly known as: NEURONTIN Take 300 mg by mouth daily.   ibuprofen 800 MG tablet Commonly known as: ADVIL Take 1 tablet (800 mg total) by mouth every 6 (six) hours as needed. What changed:  when to take this reasons to take this   loratadine 10 MG tablet Commonly known as: CLARITIN Take 10 mg by mouth daily as needed for allergies.   multivitamin with minerals Tabs tablet Take 1 tablet by mouth daily.   omeprazole 40 MG capsule Commonly known as: PRILOSEC Take 1 capsule by mouth every morning.   pramipexole 0.5 MG tablet Commonly known as: MIRAPEX Take 0.5 mg by mouth at bedtime.   primidone 50 MG tablet Commonly known as: MYSOLINE Take 50 mg by mouth daily.   tamoxifen 20 MG tablet Commonly known as: NOLVADEX Take 1/2 (one-half) tablet by mouth once daily What changed: See the new instructions.   venlafaxine XR 150 MG 24 hr capsule Commonly known as: EFFEXOR-XR Take 150 mg by mouth daily with breakfast.        Follow-up Information     [name], PA-C. Schedule an appointment as soon as possible for a visit in 1 day(s).   Specialty: Physician Assistant Contact information: [address] [place] [address] [phone]                Allergies  Allergen Reactions   Codeine Hives and Nausea And Vomiting   Morphine And Related Nausea Only   Penicillins Hives    Did it involve swelling of the face/tongue/throat, SOB, or low BP? No Did it involve sudden or severe rash/hives, skin peeling, or any reaction on the inside of your mouth or nose? No Did you need to seek medical attention at a hospital or doctor's office? No When did it last happen?      10+ years If all above answers are "NO", may proceed with cephalosporin use.     Consultations: None  Procedures/Studies: MR BRAIN WO CONTRAST  Result Date:  01/07/2021 CLINICAL DATA:  Altered mental status and weakness over the last 2 days. EXAM: MRI HEAD WITHOUT CONTRAST TECHNIQUE: Multiplanar, multiecho pulse sequences of the brain and surrounding structures were obtained without intravenous contrast. COMPARISON:  Head CT 06/06/2007 FINDINGS: Brain: The brain has a normal appearance without evidence of malformation, atrophy, old or acute small or large vessel infarction, mass lesion, hemorrhage, hydrocephalus or extra-axial collection. Vascular: Major vessels at the base of the brain show flow. Venous sinuses appear patent. Skull and upper cervical spine: Normal. Sinuses/Orbits: Clear/normal. Other: None significant. IMPRESSION: Normal examination. No abnormality seen to explain the presenting symptoms. Electronically Signed   By: Nelson Chimes M.D.   On: 01/07/2021 12:54   US PELVIS (TRANSABDOMINAL ONLY)  Result Date: 01/02/2021 CLINICAL DATA:  Abnormal findings on diagnostic imaging of abdomen. EXAM: TRANSABDOMINAL ULTRASOUND OF PELVIS TECHNIQUE: Transabdominal ultrasound examination of the pelvis was performed including evaluation of the uterus, ovaries, adnexal regions, and pelvic cul-de-sac. COMPARISON:  May 27, 2014. FINDINGS:  Status post hysterectomy. Right ovary Reportedly status post right oophorectomy. Left ovary Not visualized. Other findings: No abnormal free fluid. No definite adnexal abnormality is noted. IMPRESSION: Status post hysterectomy and reportedly right oophorectomy. Left ovary is not visualized. No other definite adnexal or pelvic abnormality is noted. Electronically Signed   By: Marijo Conception M.D.   On: 01/02/2021 08:58      Subjective: Feels well, requesting discharge home. Ate all of dinner and breakfast. No pain. Slurred speech resolved. At mobility baseline.  Discharge Exam: Vitals:   01/08/21 2019 01/09/21 0352  BP: 123/87 (!) 108/57  Pulse: 87 77  Resp: 16 15  Temp: 98.1 F (36.7 C) 98.1 F (36.7 C)  SpO2: 100%  100%   General: Pt is alert, awake, not in acute distress Cardiovascular: RRR, S1/S2 +, no rubs, no gallops Respiratory: CTA bilaterally, no wheezing, no rhonchi Abdominal: Soft, NT, ND, bowel sounds + Extremities: No edema, no cyanosis  Labs: BNP (last 3 results) No results for input(s): BNP in the last 8760 hours. Basic Metabolic Panel: Recent Labs  Lab 01/07/21 1125 01/07/21 1901 01/08/21 0615 01/08/21 1724 01/09/21 0501  NA 134*  --  136  --  140  K <2.0*  --  <2.0* 2.7* 4.4  CL 103  --  108  --  118*  CO2 19*  --  18*  --  15*  GLUCOSE 113*  --  111*  --  101*  BUN 20  --  14  --  9  CREATININE 1.13* 1.16* 1.01*  --  0.82  CALCIUM 8.5*  --  8.3*  --  8.1*  MG 2.5*  --   --   --  2.0  PHOS 3.0  --   --   --   --    Liver Function Tests: Recent Labs  Lab 01/07/21 1125 01/08/21 0615 01/09/21 0501  AST 103* 103* 93*  ALT 61* 65* 62*  ALKPHOS 45 39 35*  BILITOT 0.4 0.5 0.4  PROT 6.6 6.0* 5.4*  ALBUMIN 3.7 3.5 3.0*   No results for input(s): LIPASE, AMYLASE in the last 168 hours. No results for input(s): AMMONIA in the last 168 hours. CBC: Recent Labs  Lab 01/07/21 1034 01/07/21 1901 01/08/21 0615  WBC 9.7 8.7 7.2  NEUTROABS 6.5  --   --   HGB 13.6 12.9 12.1  HCT 37.2 35.7* 34.2*  MCV 84.4 85.8 84.9  PLT 382 359 355   Cardiac Enzymes: No results for input(s): CKTOTAL, CKMB, CKMBINDEX, TROPONINI in the last 168 hours. BNP: Invalid input(s): POCBNP CBG: No results for input(s): GLUCAP in the last 168 hours. D-Dimer No results for input(s): DDIMER in the last 72 hours. Hgb A1c No results for input(s): HGBA1C in the last 72 hours. Lipid Profile No results for input(s): CHOL, HDL, LDLCALC, TRIG, CHOLHDL, LDLDIRECT in the last 72 hours. Thyroid function studies No results for input(s): TSH, T4TOTAL, T3FREE, THYROIDAB in the last 72 hours.  Invalid input(s): FREET3 Anemia work up No results for input(s): VITAMINB12, FOLATE, FERRITIN, TIBC, IRON,  RETICCTPCT in the last 72 hours. Urinalysis    Component Value Date/Time   COLORURINE STRAW (A) 01/07/2021 1525   APPEARANCEUR CLEAR 01/07/2021 1525   LABSPEC 1.005 01/07/2021 1525   PHURINE 7.0 01/07/2021 1525   GLUCOSEU NEGATIVE 01/07/2021 1525   HGBUR MODERATE (A) 01/07/2021 1525   BILIRUBINUR NEGATIVE 01/07/2021 1525   KETONESUR NEGATIVE 01/07/2021 1525   PROTEINUR NEGATIVE 01/07/2021 1525   UROBILINOGEN 0.2  05/27/2014 0352   NITRITE NEGATIVE 01/07/2021 [place] 01/07/2021 1525    Microbiology Recent Results (from the past 240 hour(s))  Resp Panel by RT-PCR (Flu A&B, Covid) Nasopharyngeal Swab     Status: None   Collection Time: 01/07/21  2:09 PM   Specimen: Nasopharyngeal Swab; Nasopharyngeal(NP) swabs in vial transport medium  Result Value Ref Range Status   SARS Coronavirus 2 by RT PCR NEGATIVE NEGATIVE Final    Comment: (NOTE) SARS-CoV-2 target nucleic acids are NOT DETECTED.  The SARS-CoV-2 RNA is generally detectable in upper respiratory specimens during the acute phase of infection. The lowest concentration of SARS-CoV-2 viral copies this assay can detect is 138 copies/mL. A negative result does not preclude SARS-Cov-2 infection and should not be used as the sole basis for treatment or other patient management decisions. A negative result may occur with  improper specimen collection/handling, submission of specimen other than nasopharyngeal swab, presence of viral mutation(s) within the areas targeted by this assay, and inadequate number of viral copies(<138 copies/mL). A negative result must be combined with clinical observations, patient history, and epidemiological information. The expected result is Negative.  Fact Sheet for Patients:  EntrepreneurPulse.com.au  Fact Sheet for Healthcare Providers:  IncredibleEmployment.be  This test is no t yet approved or cleared by the [place] FDA and  has  been authorized for detection and/or diagnosis of SARS-CoV-2 by FDA under an Emergency Use Authorization (EUA). This EUA will remain  in effect (meaning this test can be used) for the duration of the COVID-19 declaration under Section 564(b)(1) of the Act, 21 U.S.C.section 360bbb-3(b)(1), unless the authorization is terminated  or revoked sooner.       Influenza A by PCR NEGATIVE NEGATIVE Final   Influenza B by PCR NEGATIVE NEGATIVE Final    Comment: (NOTE) The Xpert Xpress SARS-CoV-2/FLU/RSV plus assay is intended as an aid in the diagnosis of influenza from Nasopharyngeal swab specimens and should not be used as a sole basis for treatment. Nasal washings and aspirates are unacceptable for Xpert Xpress SARS-CoV-2/FLU/RSV testing.  Fact Sheet for Patients: EntrepreneurPulse.com.au  Fact Sheet for Healthcare Providers: IncredibleEmployment.be  This test is not yet approved or cleared by the [place] FDA and has been authorized for detection and/or diagnosis of SARS-CoV-2 by FDA under an Emergency Use Authorization (EUA). This EUA will remain in effect (meaning this test can be used) for the duration of the COVID-19 declaration under Section 564(b)(1) of the Act, 21 U.S.C. section 360bbb-3(b)(1), unless the authorization is terminated or revoked.  Performed at [facility], [address] [place], [place] [address]     Time coordinating discharge: Approximately 40 minutes  Patrecia Pour, MD  [facility] 01/09/2021, 9:41 AM

## 2021-01-11 ENCOUNTER — Other Ambulatory Visit: Payer: Self-pay

## 2021-01-11 ENCOUNTER — Ambulatory Visit (HOSPITAL_COMMUNITY): Payer: Medicaid Other

## 2021-01-11 ENCOUNTER — Encounter (HOSPITAL_COMMUNITY): Payer: Self-pay

## 2021-01-11 DIAGNOSIS — M6281 Muscle weakness (generalized): Secondary | ICD-10-CM

## 2021-01-11 DIAGNOSIS — G8929 Other chronic pain: Secondary | ICD-10-CM | POA: Diagnosis present

## 2021-01-11 DIAGNOSIS — M545 Low back pain, unspecified: Secondary | ICD-10-CM | POA: Diagnosis present

## 2021-01-11 NOTE — Therapy (Signed)
[facility] [address] [place], [place], [address] Phone: [phone]   Fax:  [phone]  Physical Therapy Treatment  Patient Details  Name: Elizabeth Bullock MRN: [id number] Date of Birth: 1966/04/07 Referring Provider (PT): Particia Nearing, PA-C   Encounter Date: 01/11/2021   PT End of Session - 01/11/21 1008     Visit Number 3    Number of Visits 12    Date for PT Re-Evaluation 02/13/21    Authorization Type healthy blue medicaid- auth requested on 01/02/21    Progress Note Due on Visit 10    PT Start Time 1002    PT Stop Time 1040    PT Time Calculation (min) 38 min    Activity Tolerance Patient tolerated treatment well    Behavior During Therapy WFL for tasks assessed/performed             Past Medical History:  Diagnosis Date   Anxiety    Bipolar disorder ([place])    Breast cancer ([place])    radiation   Bronchitis, chronic ([place])    COPD (chronic obstructive pulmonary disease) ([place])    DDD (degenerative disc disease), cervical    DDD (degenerative disc disease), lumbar    Degenerative disorder of bone    OF BACK   Depression    GERD (gastroesophageal reflux disease)    Headache(784.0)    History of migraines,  takes propanolol for headaches   High cholesterol    Hypertension    Neuropathy    h/o   Personal history of radiation therapy    RLS (restless legs syndrome)    Tobacco abuse    Tremor     Past Surgical History:  Procedure Laterality Date   ABDOMINAL HYSTERECTOMY     ANKLE SURGERY Left    left ankle surgery as teenager per pt   BREAST LUMPECTOMY Left 03/15/2019   BREAST LUMPECTOMY WITH RADIOACTIVE SEED AND SENTINEL LYMPH NODE BIOPSY Left 03/15/2019   Procedure: LEFT BREAST LUMPECTOMY WITH RADIOACTIVE SEED AND AXILLARY SENTINEL LYMPH NODE BIOPSY;  Surgeon: [name], MD;  Location: [facility];  Service: General;  Laterality: Left;   CHOLECYSTECTOMY  03/13/2011   Procedure: LAPAROSCOPIC CHOLECYSTECTOMY;   Surgeon: Donato Heinz;  Location: [facility];  Service: General;  Laterality: N/A;    There were no vitals filed for this visit.   Subjective Assessment - 01/11/21 1006     Subjective Pt hospitalized due to low potassium, discharged yesterday.  No reports of pain.  Reports feet are swollen and calves are tight today.    Pertinent History depression, multiple abdominal surgeries    Patient Stated Goals STates that she would like to have less pain with cooking and cleaning.    Currently in Pain? No/denies                               [facility] Adult PT Treatment/Exercise - 01/11/21 0001       Exercises   Exercises Lumbar      Lumbar Exercises: Stretches   Single Knee to Chest Stretch 1 rep;30 seconds    Lower Trunk Rotation 5 reps;10 seconds    Prone on Elbows Stretch 1 rep    Prone on Elbows Stretch Limitations 2 min    Press Ups 5 reps;10 seconds      Lumbar Exercises: Standing   Other Standing Lumbar Exercises partial tandem stance 2x 30"  Lumbar Exercises: Seated   Sit to Stand 15 reps   with weighted yellow ball     Lumbar Exercises: Supine   Bridge 10 reps;5 seconds    Bridge Limitations 2 sets      Lumbar Exercises: Sidelying   Hip Abduction 10 reps    Hip Abduction Weights (lbs) 3      Lumbar Exercises: Prone   Single Arm Raise Right;Left;10 reps;3 seconds    Straight Leg Raise 10 reps    Other Prone Lumbar Exercises hamstring curls 15x 3# slow and controlled      Lumbar Exercises: Quadruped   [place] 10 reps    Other Quadruped Lumbar Exercises rotation 5                      PT Short Term Goals - 01/02/21 1516       PT SHORT TERM GOAL #1   Title Patient will report participating in daily walkign program inside home to improve overall endurance    Time 3    Period Weeks    Status New    Target Date 01/23/21      PT SHORT TERM GOAL #2   Title Patient will report at least 50% improvement in overall symptoms  and/or function to demonstrate improved functional mobility    Time 3    Period Weeks    Status New    Target Date 01/23/21      PT SHORT TERM GOAL #3   Title Patient will be independent in self management strategies to improve quality of life and functional outcomes.    Time 3    Period Weeks    Status New    Target Date 01/23/21               PT Long Term Goals - 01/02/21 1517       PT LONG TERM GOAL #1   Title Patient will be able demosntrate painfree lumbar ROM and painfree MMT    Time 6    Period Weeks    Status New    Target Date 02/13/21      PT LONG TERM GOAL #2   Title Patient will report ability to cook/clean without difficulty to improve ability to take care of her parents.    Time 6    Period Weeks    Status New    Target Date 02/13/21      PT LONG TERM GOAL #3   Title Patient will report at least 75% improvement in overall symptoms and/or function to demonstrate improved functional mobility    Time 6    Period Weeks    Status New    Target Date 02/13/21                   Plan - 01/11/21 1009     Clinical Impression Statement Pt reports edema present LE following recent hospialization, educated benefit wiht elevation over heart and ankle pumps.  Session focus on core and proximal strengthening as well as lumbar mobility.  Added mobility wiht good tolerance and no reports of pain through session.  Added static balance which was difficult for pt, required min A and cueing for abdominal sets and posture that increased ease.    Personal Factors and Comorbidities Comorbidity 1    Comorbidities multiple abdominal surgeries    Examination-Activity Limitations Caring for Others;Locomotion Level;Lift;Stand;Stairs;Bend    Examination-Participation Restrictions Cleaning;Community Activity;Laundry;Yard Work;Shop    [employer]  Stable/Uncomplicated    Clinical Decision Making Low    Rehab Potential Fair    PT Frequency 2x / week     PT Duration 6 weeks    PT Treatment/Interventions ADLs/Self Care Home Management;Cryotherapy;Moist Heat;Traction;Therapeutic exercise;Manual techniques;Therapeutic activities;Neuromuscular re-education;Patient/family education;Dry needling;Passive range of motion;Joint Manipulations    PT Next Visit Plan hip and lumbar ROM, LE and core strengthening,    PT Home Exercise Plan SKC, walking program; 7/8 SAQs, SLR, bridges, STS, hip exten, hamstring curl    Consulted and Agree with Plan of Care Patient             Patient will benefit from skilled therapeutic intervention in order to improve the following deficits and impairments:  Pain, Decreased activity tolerance, Decreased mobility, Improper body mechanics, Decreased range of motion, Decreased strength, Decreased endurance  Visit Diagnosis: Chronic midline low back pain without sciatica  Muscle weakness (generalized)     Problem List Patient Active Problem List   Diagnosis Date Noted   Hypokalemia 01/07/2021   Generalized weakness 01/07/2021   Malignant neoplasm of upper-inner quadrant of left breast in female, estrogen receptor positive ([place]) 02/11/2019   Screening breast examination 02/08/2019   Nausea with vomiting 02/09/2015   Pain in the chest    Gastroenteritis    Hypertension    Tobacco abuse    Chest pain 02/08/2015   Ihor Austin, LPTA/CLT; CBIS [phone]  Aldona Lento 01/11/2021, 5:30 PM  [facility] [place], [place], [address] Phone: [phone]   Fax:  [phone]  Name: Elizabeth Bullock MRN: [id number] Date of Birth: April 09, 1966

## 2021-01-15 ENCOUNTER — Ambulatory Visit: Payer: Medicaid Other | Admitting: Gastroenterology

## 2021-01-16 ENCOUNTER — Encounter (HOSPITAL_COMMUNITY): Payer: Self-pay

## 2021-01-16 ENCOUNTER — Other Ambulatory Visit: Payer: Self-pay

## 2021-01-16 ENCOUNTER — Ambulatory Visit (HOSPITAL_COMMUNITY): Payer: Medicaid Other

## 2021-01-16 DIAGNOSIS — G8929 Other chronic pain: Secondary | ICD-10-CM

## 2021-01-16 DIAGNOSIS — M545 Low back pain, unspecified: Secondary | ICD-10-CM

## 2021-01-16 DIAGNOSIS — M6281 Muscle weakness (generalized): Secondary | ICD-10-CM

## 2021-01-16 NOTE — Therapy (Signed)
[facility] [address] [place], [place], [address] Phone: [phone]   Fax:  [phone]  Physical Therapy Treatment  Patient Details  Name: Elizabeth Bullock MRN: [id number] Date of Birth: 04-30-1966 Referring Provider (PT): Particia Nearing, PA-C   Encounter Date: 01/16/2021   PT End of Session - 01/16/21 1602     Visit Number 4    Number of Visits 12    Date for PT Re-Evaluation 02/13/21    Authorization Type healthy blue medicaid-    Authorization Time Period approved 12 visits 7/6-->02/13/21    Progress Note Due on Visit 10    PT Start Time 1536    PT Stop Time 7322    PT Time Calculation (min) 38 min    Activity Tolerance Patient tolerated treatment well    Behavior During Therapy WFL for tasks assessed/performed             Past Medical History:  Diagnosis Date   Anxiety    Bipolar disorder ([place])    Breast cancer ([place])    radiation   Bronchitis, chronic ([place])    COPD (chronic obstructive pulmonary disease) ([place])    DDD (degenerative disc disease), cervical    DDD (degenerative disc disease), lumbar    Degenerative disorder of bone    OF BACK   Depression    GERD (gastroesophageal reflux disease)    Headache(784.0)    History of migraines,  takes propanolol for headaches   High cholesterol    Hypertension    Neuropathy    h/o   Personal history of radiation therapy    RLS (restless legs syndrome)    Tobacco abuse    Tremor     Past Surgical History:  Procedure Laterality Date   ABDOMINAL HYSTERECTOMY     ANKLE SURGERY Left    left ankle surgery as teenager per pt   BREAST LUMPECTOMY Left 03/15/2019   BREAST LUMPECTOMY WITH RADIOACTIVE SEED AND SENTINEL LYMPH NODE BIOPSY Left 03/15/2019   Procedure: LEFT BREAST LUMPECTOMY WITH RADIOACTIVE SEED AND AXILLARY SENTINEL LYMPH NODE BIOPSY;  Surgeon: [name], MD;  Location: [facility];  Service: General;  Laterality: Left;   CHOLECYSTECTOMY  03/13/2011   Procedure:  LAPAROSCOPIC CHOLECYSTECTOMY;  Surgeon: Donato Heinz;  Location: [facility];  Service: General;  Laterality: N/A;    There were no vitals filed for this visit.   Subjective Assessment - 01/16/21 1545     Subjective Pt stated she felt like noodles following last session.  Reports swelling has reduced, stated she has some Lt ankle still has some.  Has began elevating that has helped.  Interested in compression hose.    Pertinent History depression, multiple abdominal surgeries    Patient Stated Goals STates that she would like to have less pain with cooking and cleaning.    Currently in Pain? No/denies                               [facility] Adult PT Treatment/Exercise - 01/16/21 0001       Lumbar Exercises: Stretches   Prone on Elbows Stretch Limitations 2 min    Press Ups 5 reps;10 seconds      Lumbar Exercises: Standing   Other Standing Lumbar Exercises tandem stance 2x 30"; toe tapping 8" 20x alternating    Other Standing Lumbar Exercises wall arch 10x 5", cervical retraction      Lumbar Exercises: Prone  Single Arm Raise Right;Left;10 reps;3 seconds    Straight Leg Raise 10 reps                    PT Education - 01/16/21 1617     Education Details Educated benefits with compression garments, measurements taken and paperwork given    Person(s) Educated Patient    Methods Explanation;Handout    Comprehension Verbalized understanding              PT Short Term Goals - 01/02/21 1516       PT SHORT TERM GOAL #1   Title Patient will report participating in daily walkign program inside home to improve overall endurance    Time 3    Period Weeks    Status New    Target Date 01/23/21      PT SHORT TERM GOAL #2   Title Patient will report at least 50% improvement in overall symptoms and/or function to demonstrate improved functional mobility    Time 3    Period Weeks    Status New    Target Date 01/23/21      PT SHORT TERM GOAL #3    Title Patient will be independent in self management strategies to improve quality of life and functional outcomes.    Time 3    Period Weeks    Status New    Target Date 01/23/21               PT Long Term Goals - 01/02/21 1517       PT LONG TERM GOAL #1   Title Patient will be able demosntrate painfree lumbar ROM and painfree MMT    Time 6    Period Weeks    Status New    Target Date 02/13/21      PT LONG TERM GOAL #2   Title Patient will report ability to cook/clean without difficulty to improve ability to take care of her parents.    Time 6    Period Weeks    Status New    Target Date 02/13/21      PT LONG TERM GOAL #3   Title Patient will report at least 75% improvement in overall symptoms and/or function to demonstrate improved functional mobility    Time 6    Period Weeks    Status New    Target Date 02/13/21                   Plan - 01/16/21 1604     Clinical Impression Statement Pt reports swelling continues, educated benefits with compression garments with measurements and handout given to pt.  Session focus on core and proximal strengthening.  Added postural strengthening exercises with some cueing required for proper form, able to perform correctly following cueing.  Will need further postural strengthening in future apts.  Balance is progressing well, able to complete tandem stance with no LOB or HHA required.  Added toe tapping for core strengthening.  No reports of pain through session.    Personal Factors and Comorbidities Comorbidity 1    Comorbidities multiple abdominal surgeries    Examination-Activity Limitations Caring for Others;Locomotion Level;Lift;Stand;Stairs;Bend    Examination-Participation Restrictions Cleaning;Community Activity;Laundry;Yard Work;Shop    Stability/Clinical Decision Making Stable/Uncomplicated    Clinical Decision Making Low    Rehab Potential Fair    PT Frequency 2x / week    PT Duration 6 weeks    PT  Treatment/Interventions ADLs/Self Care Home Management;Cryotherapy;Moist Heat;Traction;Therapeutic  exercise;Manual techniques;Therapeutic activities;Neuromuscular re-education;Patient/family education;Dry needling;Passive range of motion;Joint Manipulations    PT Next Visit Plan hip and lumbar ROM, LE and core strengthening.  Progress balance to dynamic surface, add postural strengthening.  Prone UE/LE    PT Home Exercise Plan SKC, walking program; 7/8 SAQs, SLR, bridges, STS, hip exten, hamstring curl; 7/20: prone SLR, SAR    Consulted and Agree with Plan of Care Patient             Patient will benefit from skilled therapeutic intervention in order to improve the following deficits and impairments:  Pain, Decreased activity tolerance, Decreased mobility, Improper body mechanics, Decreased range of motion, Decreased strength, Decreased endurance  Visit Diagnosis: Chronic midline low back pain without sciatica  Muscle weakness (generalized)     Problem List Patient Active Problem List   Diagnosis Date Noted   Hypokalemia 01/07/2021   Generalized weakness 01/07/2021   Malignant neoplasm of upper-inner quadrant of left breast in female, estrogen receptor positive ([place]) 02/11/2019   Screening breast examination 02/08/2019   Nausea with vomiting 02/09/2015   Pain in the chest    Gastroenteritis    Hypertension    Tobacco abuse    Chest pain 02/08/2015   Ihor Austin, LPTA/CLT; CBIS [phone]  Aldona Lento 01/16/2021, 4:18 PM  [facility] [address] [place], [place], [address] Phone: [phone]   Fax:  [phone]  Name: Elizabeth Bullock MRN: [id number] Date of Birth: 1965/11/18

## 2021-01-18 ENCOUNTER — Encounter (HOSPITAL_COMMUNITY): Payer: Self-pay

## 2021-01-18 ENCOUNTER — Other Ambulatory Visit: Payer: Self-pay

## 2021-01-18 ENCOUNTER — Ambulatory Visit (HOSPITAL_COMMUNITY): Payer: Medicaid Other

## 2021-01-18 DIAGNOSIS — M545 Low back pain, unspecified: Secondary | ICD-10-CM

## 2021-01-18 DIAGNOSIS — G8929 Other chronic pain: Secondary | ICD-10-CM

## 2021-01-18 DIAGNOSIS — M6281 Muscle weakness (generalized): Secondary | ICD-10-CM

## 2021-01-18 NOTE — Therapy (Signed)
[facility] [address] [place], [place], [address] Phone: [phone]   Fax:  [phone]  Physical Therapy Treatment  Patient Details  Name: Elizabeth Bullock MRN: [id number] Date of Birth: 08/03/1965 Referring Provider (PT): Particia Nearing, PA-C   Encounter Date: 01/18/2021   PT End of Session - 01/18/21 1129     Visit Number 5    Number of Visits 12    Date for PT Re-Evaluation 02/13/21    Authorization Type healthy blue medicaid-    Authorization Time Period approved 12 visits 7/6-->02/13/21    Progress Note Due on Visit 10    PT Start Time 1123    PT Stop Time 1202    PT Time Calculation (min) 39 min    Activity Tolerance Patient tolerated treatment well    Behavior During Therapy WFL for tasks assessed/performed             Past Medical History:  Diagnosis Date   Anxiety    Bipolar disorder ([place])    Breast cancer ([place])    radiation   Bronchitis, chronic ([place])    COPD (chronic obstructive pulmonary disease) ([place])    DDD (degenerative disc disease), cervical    DDD (degenerative disc disease), lumbar    Degenerative disorder of bone    OF BACK   Depression    GERD (gastroesophageal reflux disease)    Headache(784.0)    History of migraines,  takes propanolol for headaches   High cholesterol    Hypertension    Neuropathy    h/o   Personal history of radiation therapy    RLS (restless legs syndrome)    Tobacco abuse    Tremor     Past Surgical History:  Procedure Laterality Date   ABDOMINAL HYSTERECTOMY     ANKLE SURGERY Left    left ankle surgery as teenager per pt   BREAST LUMPECTOMY Left 03/15/2019   BREAST LUMPECTOMY WITH RADIOACTIVE SEED AND SENTINEL LYMPH NODE BIOPSY Left 03/15/2019   Procedure: LEFT BREAST LUMPECTOMY WITH RADIOACTIVE SEED AND AXILLARY SENTINEL LYMPH NODE BIOPSY;  Surgeon: [name], MD;  Location: [facility];  Service: General;  Laterality: Left;   CHOLECYSTECTOMY  03/13/2011   Procedure:  LAPAROSCOPIC CHOLECYSTECTOMY;  Surgeon: Donato Heinz;  Location: [facility];  Service: General;  Laterality: N/A;    There were no vitals filed for this visit.   Subjective Assessment - 01/18/21 1128     Subjective No new issues other than general fatigue where she recently had an issue with K+ abnormalities requiring hospitalization. Denies SOB, palpitations, or other related issues.    Pertinent History depression, multiple abdominal surgeries    Patient Stated Goals STates that she would like to have less pain with cooking and cleaning.    Currently in Pain? No/denies    Pain Score 0-No pain                [facility] PT Assessment - 01/18/21 0001       Assessment   Medical Diagnosis LBP    Referring Provider (PT) Particia Nearing, PA-C                           [facility] Adult PT Treatment/Exercise - 01/18/21 0001       Exercises   Exercises Knee/Hip      Lumbar Exercises: Stretches   Single Knee to Chest Stretch Right;Left;1 rep;30 seconds    Press Ups 10  reps;5 seconds      Lumbar Exercises: Supine   Ab Set Other (comment)   3x10, 2 sec hold   Bridge Other (comment)   3x10   Bridge Limitations 2 sets      Lumbar Exercises: Prone   Other Prone Lumbar Exercises hamstring curls 3x10 5 lbs      Knee/Hip Exercises: Supine   Short Arc Quad Sets Strengthening;Both;3 sets;10 reps    Short Arc Quad Sets Limitations 5#    Hip Adduction Isometric Strengthening;Both;3 sets;10 reps    Straight Leg Raises Strengthening;Both;3 sets;10 reps    Straight Leg Raises Limitations 5 lbs                    PT Education - 01/18/21 1153     Education Details education on continued HEP performance    Person(s) Educated Patient    Methods Explanation    Comprehension Verbalized understanding              PT Short Term Goals - 01/18/21 1155       PT SHORT TERM GOAL #1   Title Patient will report participating in daily walkign program inside home to improve  overall endurance    Time 3    Period Weeks    Status On-going    Target Date 01/23/21      PT SHORT TERM GOAL #2   Title Patient will report at least 50% improvement in overall symptoms and/or function to demonstrate improved functional mobility    Time 3    Period Weeks    Status On-going    Target Date 01/23/21      PT SHORT TERM GOAL #3   Title Patient will be independent in self management strategies to improve quality of life and functional outcomes.    Time 3    Period Weeks    Status On-going    Target Date 01/23/21               PT Long Term Goals - 01/02/21 1517       PT LONG TERM GOAL #1   Title Patient will be able demosntrate painfree lumbar ROM and painfree MMT    Time 6    Period Weeks    Status New    Target Date 02/13/21      PT LONG TERM GOAL #2   Title Patient will report ability to cook/clean without difficulty to improve ability to take care of her parents.    Time 6    Period Weeks    Status New    Target Date 02/13/21      PT LONG TERM GOAL #3   Title Patient will report at least 75% improvement in overall symptoms and/or function to demonstrate improved functional mobility    Time 6    Period Weeks    Status New    Target Date 02/13/21                   Plan - 01/18/21 1153     Clinical Impression Statement Pt demo improved activity tolerance and able to increase repetitions with exercises and increase resistance to 5 lbs. No extensor lag with SLR appreciated. Good recruitment with ab set appreciated. Continued tx sessions indicated to progress core/LE strength to proceed to functional lifts    Personal Factors and Comorbidities Comorbidity 1    Comorbidities multiple abdominal surgeries    Examination-Activity Limitations Caring for Others;Locomotion Level;Lift;Stand;Stairs;[employer]  Examination-Participation Restrictions Cleaning;Community Activity;Laundry;Yard Work;Shop    Stability/Clinical Decision Making  Stable/Uncomplicated    Rehab Potential Fair    PT Frequency 2x / week    PT Duration 6 weeks    PT Treatment/Interventions ADLs/Self Care Home Management;Cryotherapy;Moist Heat;Traction;Therapeutic exercise;Manual techniques;Therapeutic activities;Neuromuscular re-education;Patient/family education;Dry needling;Passive range of motion;Joint Manipulations    PT Next Visit Plan hip and lumbar ROM, LE and core strengthening.  Progress balance to dynamic surface, add postural strengthening.  Prone UE/LE    PT Home Exercise Plan SKC, walking program; 7/8 SAQs, SLR, bridges, STS, hip exten, hamstring curl; 7/20: prone SLR, SAR    Consulted and Agree with Plan of Care Patient             Patient will benefit from skilled therapeutic intervention in order to improve the following deficits and impairments:  Pain, Decreased activity tolerance, Decreased mobility, Improper body mechanics, Decreased range of motion, Decreased strength, Decreased endurance  Visit Diagnosis: Chronic midline low back pain without sciatica  Muscle weakness (generalized)     Problem List Patient Active Problem List   Diagnosis Date Noted   Hypokalemia 01/07/2021   Generalized weakness 01/07/2021   Malignant neoplasm of upper-inner quadrant of left breast in female, estrogen receptor positive ([place]) 02/11/2019   Screening breast examination 02/08/2019   Nausea with vomiting 02/09/2015   Pain in the chest    Gastroenteritis    Hypertension    Tobacco abuse    Chest pain 02/08/2015   12:11 PM, 01/18/21 M. Sherlyn Lees, PT, DPT Physical Therapist- [place] Office Number: [phone]   [facility] [address] [place], [place], [address] Phone: [phone]   Fax:  [phone]  Name: Elizabeth Bullock MRN: [id number] Date of Birth: 07-Feb-1966

## 2021-01-22 ENCOUNTER — Ambulatory Visit (HOSPITAL_COMMUNITY): Payer: Medicaid Other

## 2021-01-22 ENCOUNTER — Encounter (HOSPITAL_COMMUNITY): Payer: Self-pay

## 2021-01-22 ENCOUNTER — Other Ambulatory Visit: Payer: Self-pay

## 2021-01-22 DIAGNOSIS — G8929 Other chronic pain: Secondary | ICD-10-CM

## 2021-01-22 DIAGNOSIS — M6281 Muscle weakness (generalized): Secondary | ICD-10-CM

## 2021-01-22 DIAGNOSIS — M545 Low back pain, unspecified: Secondary | ICD-10-CM | POA: Diagnosis not present

## 2021-01-22 NOTE — Therapy (Signed)
[facility] [address] [place], [place], [address] Phone: [phone]   Fax:  [phone]  Physical Therapy Treatment  Patient Details  Name: Elizabeth Bullock MRN: [id number] Date of Birth: 30-Oct-1965 Referring Provider (PT): Particia Nearing, PA-C   Encounter Date: 01/22/2021   PT End of Session - 01/22/21 1100     Visit Number 6    Number of Visits 12    Date for PT Re-Evaluation 02/13/21    Authorization Type healthy blue medicaid-    Authorization Time Period approved 12 visits 7/6-->02/13/21    Progress Note Due on Visit 10    PT Start Time 1050    PT Stop Time 1128    PT Time Calculation (min) 38 min    Activity Tolerance Patient tolerated treatment well    Behavior During Therapy WFL for tasks assessed/performed             Past Medical History:  Diagnosis Date   Anxiety    Bipolar disorder ([place])    Breast cancer ([place])    radiation   Bronchitis, chronic ([place])    COPD (chronic obstructive pulmonary disease) ([place])    DDD (degenerative disc disease), cervical    DDD (degenerative disc disease), lumbar    Degenerative disorder of bone    OF BACK   Depression    GERD (gastroesophageal reflux disease)    Headache(784.0)    History of migraines,  takes propanolol for headaches   High cholesterol    Hypertension    Neuropathy    h/o   Personal history of radiation therapy    RLS (restless legs syndrome)    Tobacco abuse    Tremor     Past Surgical History:  Procedure Laterality Date   ABDOMINAL HYSTERECTOMY     ANKLE SURGERY Left    left ankle surgery as teenager per pt   BREAST LUMPECTOMY Left 03/15/2019   BREAST LUMPECTOMY WITH RADIOACTIVE SEED AND SENTINEL LYMPH NODE BIOPSY Left 03/15/2019   Procedure: LEFT BREAST LUMPECTOMY WITH RADIOACTIVE SEED AND AXILLARY SENTINEL LYMPH NODE BIOPSY;  Surgeon: [name], MD;  Location: [facility];  Service: General;  Laterality: Left;   CHOLECYSTECTOMY  03/13/2011   Procedure:  LAPAROSCOPIC CHOLECYSTECTOMY;  Surgeon: Donato Heinz;  Location: [facility];  Service: General;  Laterality: N/A;    There were no vitals filed for this visit.   Subjective Assessment - 01/22/21 1059     Subjective Reports she has began walking daily, has walked 16 minutes in [employer] earlier today.  No reports of pain today.  Reports ability to complete ADLs without difficulty, continues to require rest breaks while sweeping.    Pertinent History depression, multiple abdominal surgeries    Patient Stated Goals STates that she would like to have less pain with cooking and cleaning.    Currently in Pain? No/denies                [facility] PT Assessment - 01/22/21 0001       Assessment   Medical Diagnosis LBP    Referring Provider (PT) Particia Nearing, PA-C    Onset Date/Surgical Date --   years ago   Hand Dominance Left    Next MD Visit 03/24/21                           [facility] Adult PT Treatment/Exercise - 01/22/21 0001       Exercises  Exercises Knee/Hip      Lumbar Exercises: Stretches   Prone on Elbows Stretch Limitations 2 min    Press Ups 10 reps;5 seconds      Lumbar Exercises: Standing   Row Strengthening;Both;10 reps;Theraband    Theraband Level (Row) Level 3 (Green)    Row Limitations 2 sets    Shoulder Extension Both;10 reps;Theraband    Theraband Level (Shoulder Extension) Level 3 (Green)    Shoulder Extension Limitations 2 sets    Other Standing Lumbar Exercises marching wiht GTB shoulder extension 10x 3"    Other Standing Lumbar Exercises wall arch 10x 5", cervical retraction      Lumbar Exercises: Seated   Other Seated Lumbar Exercises Wback 10x    Other Seated Lumbar Exercises cervical retraction      Lumbar Exercises: Quadruped   Opposite Arm/Leg Raise Right arm/Left leg;Left arm/Right leg;10 reps;3 seconds                      PT Short Term Goals - 01/18/21 1155       PT SHORT TERM GOAL #1   Title Patient will report  participating in daily walkign program inside home to improve overall endurance    Time 3    Period Weeks    Status On-going    Target Date 01/23/21      PT SHORT TERM GOAL #2   Title Patient will report at least 50% improvement in overall symptoms and/or function to demonstrate improved functional mobility    Time 3    Period Weeks    Status On-going    Target Date 01/23/21      PT SHORT TERM GOAL #3   Title Patient will be independent in self management strategies to improve quality of life and functional outcomes.    Time 3    Period Weeks    Status On-going    Target Date 01/23/21               PT Long Term Goals - 01/02/21 1517       PT LONG TERM GOAL #1   Title Patient will be able demosntrate painfree lumbar ROM and painfree MMT    Time 6    Period Weeks    Status New    Target Date 02/13/21      PT LONG TERM GOAL #2   Title Patient will report ability to cook/clean without difficulty to improve ability to take care of her parents.    Time 6    Period Weeks    Status New    Target Date 02/13/21      PT LONG TERM GOAL #3   Title Patient will report at least 75% improvement in overall symptoms and/or function to demonstrate improved functional mobility    Time 6    Period Weeks    Status New    Target Date 02/13/21                   Plan - 01/22/21 1121     Clinical Impression Statement Pt progressing well towards POC.  Added theraband postural strengthening paired with abdominal strengthening exercises, cueing for stability with task.  Pt able to complete all exericses with no reports of pain.  Cueing for posture awareness through sessions to reduce forward head.    Personal Factors and Comorbidities Comorbidity 1    Comorbidities multiple abdominal surgeries    Examination-Activity Limitations Caring for Others;Locomotion Level;Lift;Stand;Stairs;[employer]  Examination-Participation Restrictions Cleaning;Community Activity;Laundry;Yard Work;Shop     Stability/Clinical Decision Making Stable/Uncomplicated    Clinical Decision Making Low    Rehab Potential Fair    PT Frequency 2x / week    PT Duration 6 weeks    PT Treatment/Interventions ADLs/Self Care Home Management;Cryotherapy;Moist Heat;Traction;Therapeutic exercise;Manual techniques;Therapeutic activities;Neuromuscular re-education;Patient/family education;Dry needling;Passive range of motion;Joint Manipulations    PT Next Visit Plan Next session progress functional strengthening to improve activity tolerance/mechanics wiht sweeping.  Add Paloff for abdominal strengthening.  hip and lumbar ROM, LE and core strengthening.  Progress balance to dynamic surface.    PT Home Exercise Plan [facility], walking program; 7/8 SAQs, SLR, bridges, STS, hip exten, hamstring curl; 7/20: prone SLR, SAR; 7/26: GTB rows and shoulder extension    Consulted and Agree with Plan of Care Patient             Patient will benefit from skilled therapeutic intervention in order to improve the following deficits and impairments:  Pain, Decreased activity tolerance, Decreased mobility, Improper body mechanics, Decreased range of motion, Decreased strength, Decreased endurance  Visit Diagnosis: Chronic midline low back pain without sciatica  Muscle weakness (generalized)     Problem List Patient Active Problem List   Diagnosis Date Noted   Hypokalemia 01/07/2021   Generalized weakness 01/07/2021   Malignant neoplasm of upper-inner quadrant of left breast in female, estrogen receptor positive ([place]) 02/11/2019   Screening breast examination 02/08/2019   Nausea with vomiting 02/09/2015   Pain in the chest    Gastroenteritis    Hypertension    Tobacco abuse    Chest pain 02/08/2015   Ihor Austin, LPTA/CLT; CBIS [phone]  Aldona Lento 01/22/2021, 11:42 AM  [facility] [address] [place], [place], [address] Phone: [phone]   Fax:   [phone]  Name: Elizabeth Bullock MRN: [id number] Date of Birth: 1966/01/20

## 2021-01-24 ENCOUNTER — Ambulatory Visit (HOSPITAL_COMMUNITY): Payer: Medicaid Other | Admitting: Physical Therapy

## 2021-01-24 ENCOUNTER — Encounter (HOSPITAL_COMMUNITY): Payer: Self-pay | Admitting: Physical Therapy

## 2021-01-24 ENCOUNTER — Other Ambulatory Visit: Payer: Self-pay

## 2021-01-24 DIAGNOSIS — M6281 Muscle weakness (generalized): Secondary | ICD-10-CM

## 2021-01-24 DIAGNOSIS — M545 Low back pain, unspecified: Secondary | ICD-10-CM | POA: Diagnosis not present

## 2021-01-24 DIAGNOSIS — G8929 Other chronic pain: Secondary | ICD-10-CM

## 2021-01-24 NOTE — Therapy (Signed)
[facility] [address] [place], [place], [address] Phone: [phone]   Fax:  [phone]  Physical Therapy Treatment  Patient Details  Name: Elizabeth Bullock MRN: [id number] Date of Birth: [birth year] Referring Provider (PT): Particia Nearing, PA-C   Encounter Date: 01/24/2021   PT End of Session - 01/24/21 1009     Visit Number 7    Number of Visits 12    Date for PT Re-Evaluation 02/13/21    Authorization Type healthy blue medicaid-    Authorization Time Period approved 12 visits 7/6-->02/13/21    Progress Note Due on Visit 10    PT Start Time 1004    PT Stop Time 1042    PT Time Calculation (min) 38 min    Activity Tolerance Patient tolerated treatment well    Behavior During Therapy WFL for tasks assessed/performed             Past Medical History:  Diagnosis Date   Anxiety    Bipolar disorder ([place])    Breast cancer ([place])    radiation   Bronchitis, chronic ([place])    COPD (chronic obstructive pulmonary disease) ([place])    DDD (degenerative disc disease), cervical    DDD (degenerative disc disease), lumbar    Degenerative disorder of bone    OF BACK   Depression    GERD (gastroesophageal reflux disease)    Headache(784.0)    History of migraines,  takes propanolol for headaches   High cholesterol    Hypertension    Neuropathy    h/o   Personal history of radiation therapy    RLS (restless legs syndrome)    Tobacco abuse    Tremor     Past Surgical History:  Procedure Laterality Date   ABDOMINAL HYSTERECTOMY     ANKLE SURGERY Left    left ankle surgery as teenager per pt   BREAST LUMPECTOMY Left 03/15/2019   BREAST LUMPECTOMY WITH RADIOACTIVE SEED AND SENTINEL LYMPH NODE BIOPSY Left 03/15/2019   Procedure: LEFT BREAST LUMPECTOMY WITH RADIOACTIVE SEED AND AXILLARY SENTINEL LYMPH NODE BIOPSY;  Surgeon: [name], MD;  Location: [facility];  Service: General;  Laterality: Left;   CHOLECYSTECTOMY  03/13/2011   Procedure:  LAPAROSCOPIC CHOLECYSTECTOMY;  Surgeon: Donato Heinz;  Location: [facility];  Service: General;  Laterality: N/A;    There were no vitals filed for this visit.   Subjective Assessment - 01/24/21 1009     Subjective States that she has some muscle soreness in both hips this morning but no real pain. Reports no difficulties with her exercises. Patient with recent report of 10# weight loss in a month, MD aware and monitoring her blood work. Reports no recent drop in K+    Pertinent History depression, multiple abdominal surgeries    Patient Stated Goals STates that she would like to have less pain with cooking and cleaning.    Currently in Pain? No/denies                [facility] PT Assessment - 01/24/21 0001       Assessment   Medical Diagnosis LBP    Referring Provider (PT) Particia Nearing, PA-C                           [facility] Adult PT Treatment/Exercise - 01/24/21 0001       Lumbar Exercises: Aerobic   Elliptical 4 minutes - no resistance  Lumbar Exercises: Standing   Shoulder Extension Both;10 reps;Theraband   3 sets and 5 second holds   Theraband Level (Shoulder Extension) Level 3 (Green);Level 2 (Red)    Shoulder Extension Limitations green too difficult with hold and with good forrm    Other Standing Lumbar Exercises lateral stepping with anchored pull 3x5 bilateral red theraband    Other Standing Lumbar Exercises hip hinge hold - cues to reach forward, thne bring hands to side and thrust hips forward -  5"  holds x8      Lumbar Exercises: Seated   Long Arc Quad on Chair 10 reps;Both   3 sets  with 3# ankle weights                     PT Short Term Goals - 01/18/21 1155       PT SHORT TERM GOAL #1   Title Patient will report participating in daily walkign program inside home to improve overall endurance    Time 3    Period Weeks    Status On-going    Target Date 01/23/21      PT SHORT TERM GOAL #2   Title Patient will report at least  50% improvement in overall symptoms and/or function to demonstrate improved functional mobility    Time 3    Period Weeks    Status On-going    Target Date 01/23/21      PT SHORT TERM GOAL #3   Title Patient will be independent in self management strategies to improve quality of life and functional outcomes.    Time 3    Period Weeks    Status On-going    Target Date 01/23/21               PT Long Term Goals - 01/02/21 1517       PT LONG TERM GOAL #1   Title Patient will be able demosntrate painfree lumbar ROM and painfree MMT    Time 6    Period Weeks    Status New    Target Date 02/13/21      PT LONG TERM GOAL #2   Title Patient will report ability to cook/clean without difficulty to improve ability to take care of her parents.    Time 6    Period Weeks    Status New    Target Date 02/13/21      PT LONG TERM GOAL #3   Title Patient will report at least 75% improvement in overall symptoms and/or function to demonstrate improved functional mobility    Time 6    Period Weeks    Status New    Target Date 02/13/21                   Plan - 01/24/21 1009     Clinical Impression Statement Added elliptical at self selected pace and this really fatigued patient. Started with rest and then LAQs as patient's respiratory rate was up. Returned to standing exercises once respiratory rate returned to baseline. Recent report of unintentional weight loss over the last month but that MD aware. Instructed patient to continue to monitor weight to record any additional changes. Verbal cues throughout session. Continued to work on posture and standing endurance exercises to improve ability to engage core and to perform functional tasks at home like sweeping.    Personal Factors and Comorbidities Comorbidity 1    Comorbidities multiple abdominal surgeries    Examination-Activity Limitations Caring  for Others;Locomotion Level;Lift;Stand;Stairs;Bend    Examination-Participation  Restrictions Cleaning;Community Activity;Laundry;Yard Work;Shop    Stability/Clinical Decision Making Stable/Uncomplicated    Rehab Potential Fair    PT Frequency 2x / week    PT Duration 6 weeks    PT Treatment/Interventions ADLs/Self Care Home Management;Cryotherapy;Moist Heat;Traction;Therapeutic exercise;Manual techniques;Therapeutic activities;Neuromuscular re-education;Patient/family education;Dry needling;Passive range of motion;Joint Manipulations    PT Next Visit Plan Next session progress functional strengthening to improve activity tolerance/mechanics wiht sweeping.  Add Paloff for abdominal strengthening.  hip and lumbar ROM, LE and core strengthening.  Progress balance to dynamic surface.    PT Home Exercise Plan [facility], walking program; 7/8 SAQs, SLR, bridges, STS, hip exten, hamstring curl; 7/20: prone SLR, SAR; 7/26: GTB rows and shoulder extension    Consulted and Agree with Plan of Care Patient             Patient will benefit from skilled therapeutic intervention in order to improve the following deficits and impairments:  Pain, Decreased activity tolerance, Decreased mobility, Improper body mechanics, Decreased range of motion, Decreased strength, Decreased endurance  Visit Diagnosis: Chronic midline low back pain without sciatica  Muscle weakness (generalized)     Problem List Patient Active Problem List   Diagnosis Date Noted   Hypokalemia 01/07/2021   Generalized weakness 01/07/2021   Malignant neoplasm of upper-inner quadrant of left breast in female, estrogen receptor positive ([place]) 02/11/2019   Screening breast examination 02/08/2019   Nausea with vomiting 02/09/2015   Pain in the chest    Gastroenteritis    Hypertension    Tobacco abuse    Chest pain 02/08/2015   10:43 AM, 01/24/21 Jerene Pitch, DPT Physical Therapy with [facility]  [phone] office   [place], [place], [address] Phone: [phone]   Fax:  [phone]  Name: Elizabeth Bullock MRN: [id number] Date of Birth: [birth year]

## 2021-01-29 ENCOUNTER — Encounter (HOSPITAL_COMMUNITY): Payer: Medicaid Other

## 2021-01-31 ENCOUNTER — Ambulatory Visit (HOSPITAL_COMMUNITY): Payer: Medicaid Other | Attending: Physician Assistant | Admitting: Physical Therapy

## 2021-01-31 ENCOUNTER — Other Ambulatory Visit: Payer: Self-pay

## 2021-01-31 DIAGNOSIS — M545 Low back pain, unspecified: Secondary | ICD-10-CM | POA: Insufficient documentation

## 2021-01-31 DIAGNOSIS — G8929 Other chronic pain: Secondary | ICD-10-CM | POA: Diagnosis present

## 2021-01-31 DIAGNOSIS — M6281 Muscle weakness (generalized): Secondary | ICD-10-CM

## 2021-01-31 NOTE — Therapy (Addendum)
[facility] [place], [place], [address] Phone: [phone]   Fax:  [phone]  Physical Therapy Treatment and Discharge   Patient Details  Name: Elizabeth Bullock MRN: [id number] Date of Birth: 25-Nov-1965 Referring Provider (PT): Particia Nearing, PA-C  PHYSICAL THERAPY DISCHARGE SUMMARY  Visits from Start of Care: 8  Current functional level related to goals / functional outcomes: Could not be reassessed secondary to unplanned discharge   Remaining deficits: Could not be reassessed secondary to unplanned discharge   Education / Equipment: Could not be reassessed secondary to unplanned discharge  Patient agrees to discharge. Patient goals were not met. Patient is being discharged due to not returning since the last visit. 3:59 PM, 05/16/21 Jerene Pitch, DPT Physical Therapy with [facility]  [phone] office    Encounter Date: 01/31/2021   PT End of Session - 01/31/21 1008     Visit Number 8    Number of Visits 12    Date for PT Re-Evaluation 02/13/21    Authorization Type healthy blue medicaid-    Authorization Time Period approved 12 visits 7/6-->02/13/21    Progress Note Due on Visit 10    PT Start Time 1003    PT Stop Time 1041    PT Time Calculation (min) 38 min    Activity Tolerance Patient tolerated treatment well    Behavior During Therapy WFL for tasks assessed/performed             Past Medical History:  Diagnosis Date   Anxiety    Bipolar disorder ([place])    Breast cancer ([place])    radiation   Bronchitis, chronic ([place])    COPD (chronic obstructive pulmonary disease) ([place])    DDD (degenerative disc disease), cervical    DDD (degenerative disc disease), lumbar    Degenerative disorder of bone    OF BACK   Depression    GERD (gastroesophageal reflux disease)    Headache(784.0)    History of migraines,  takes propanolol for headaches   High cholesterol    Hypertension     Neuropathy    h/o   Personal history of radiation therapy    RLS (restless legs syndrome)    Tobacco abuse    Tremor     Past Surgical History:  Procedure Laterality Date   ABDOMINAL HYSTERECTOMY     ANKLE SURGERY Left    left ankle surgery as teenager per pt   BREAST LUMPECTOMY Left 03/15/2019   BREAST LUMPECTOMY WITH RADIOACTIVE SEED AND SENTINEL LYMPH NODE BIOPSY Left 03/15/2019   Procedure: LEFT BREAST LUMPECTOMY WITH RADIOACTIVE SEED AND AXILLARY SENTINEL LYMPH NODE BIOPSY;  Surgeon: [name], MD;  Location: [facility];  Service: General;  Laterality: Left;   CHOLECYSTECTOMY  03/13/2011   Procedure: LAPAROSCOPIC CHOLECYSTECTOMY;  Surgeon: Donato Heinz;  Location[facility];  Service: General;  Laterality: N/A;    There were no vitals filed for this visit.   Subjective Assessment - 01/31/21 1006     Subjective States that she has some muscle soreness in both hips this morning but no real pain. Reports no difficulties with her exercises. Patient with recent report of 10# weight loss in a month, MD aware and monitoring her blood work. Reports no recent drop in K+States that she is hurting in her back today as her mom fell and had to lift her yesterday. Reports 7.5/10 pain in her back.    Pertinent History depression, multiple abdominal surgeries  Patient Stated Goals STates that she would like to have less pain with cooking and cleaning.    Currently in Pain? Yes    Pain Score 7     Pain Location Back    Pain Orientation Mid    Pain Descriptors / Indicators Aching                [facility] PT Assessment - 01/31/21 0001       Assessment   Medical Diagnosis LBP    Referring Provider (PT) Particia Nearing, PA-C                           [facility] Adult PT Treatment/Exercise - 01/31/21 0001       Lumbar Exercises: Stretches   Single Knee to Chest Stretch Right;Left;10 seconds   3 minutes total - changing between L/R after 10 seconds   Prone on Elbows Stretch  Limitations 3 minutes    Piriformis Stretch Left;Right   2 minutes each (with 5-10 second holds)     Lumbar Exercises: Standing   Other Standing Lumbar Exercises box lifts from floor to chair height - 5 minutes - sets of 5 - visual and verbal cues throughout      Lumbar Exercises: Seated   Sit to Stand 5 reps   5 sets - verbal and tactile cues.   Other Seated Lumbar Exercises --      Lumbar Exercises: Supine   Bridge 10 reps;5 seconds   3 sets                     PT Short Term Goals - 01/18/21 1155       PT SHORT TERM GOAL #1   Title Patient will report participating in daily walkign program inside home to improve overall endurance    Time 3    Period Weeks    Status On-going    Target Date 01/23/21      PT SHORT TERM GOAL #2   Title Patient will report at least 50% improvement in overall symptoms and/or function to demonstrate improved functional mobility    Time 3    Period Weeks    Status On-going    Target Date 01/23/21      PT SHORT TERM GOAL #3   Title Patient will be independent in self management strategies to improve quality of life and functional outcomes.    Time 3    Period Weeks    Status On-going    Target Date 01/23/21               PT Long Term Goals - 01/02/21 1517       PT LONG TERM GOAL #1   Title Patient will be able demosntrate painfree lumbar ROM and painfree MMT    Time 6    Period Weeks    Status New    Target Date 02/13/21      PT LONG TERM GOAL #2   Title Patient will report ability to cook/clean without difficulty to improve ability to take care of her parents.    Time 6    Period Weeks    Status New    Target Date 02/13/21      PT LONG TERM GOAL #3   Title Patient will report at least 75% improvement in overall symptoms and/or function to demonstrate improved functional mobility    Time 6    Period Weeks  Status New    Target Date 02/13/21                   Plan - 01/31/21 1027     Clinical  Impression Statement Pain reduced from 7.5/10 to 5/10 pain after press ups. Reports 2.5/10 pain after SKC stretches and 0/10 pain after piriformis stretches. Educated patient on performing stretches daily and to perform when she has an increase in pain as she was able to reduce her pain by 100% after just 10 minutes of stretches. Cues to press into floor and to keep toes on the floor with sit to stand transition. Fatigue noted end of session but no pain. Will follow up with HEP adherence for stretches next session.    Personal Factors and Comorbidities Comorbidity 1    Comorbidities multiple abdominal surgeries    Examination-Activity Limitations Caring for Others;Locomotion Level;Lift;Stand;Stairs;Bend    Examination-Participation Restrictions Cleaning;Community Activity;Laundry;Yard Work;Shop    Stability/Clinical Decision Making Stable/Uncomplicated    Rehab Potential Fair    PT Frequency 2x / week    PT Duration 6 weeks    PT Treatment/Interventions ADLs/Self Care Home Management;Cryotherapy;Moist Heat;Traction;Therapeutic exercise;Manual techniques;Therapeutic activities;Neuromuscular re-education;Patient/family education;Dry needling;Passive range of motion;Joint Manipulations    PT Next Visit Plan Add Paloff for abdominal strengthening.  hip and lumbar ROM, LE and core strengthening.  Progress balance to dynamic surface.    PT Home Exercise Plan [facility], walking program; 7/8 SAQs, SLR, bridges, STS, hip exten, hamstring curl; 7/20: prone SLR, SAR; 7/26: GTB rows and shoulder extension    Consulted and Agree with Plan of Care Patient             Patient will benefit from skilled therapeutic intervention in order to improve the following deficits and impairments:  Pain, Decreased activity tolerance, Decreased mobility, Improper body mechanics, Decreased range of motion, Decreased strength, Decreased endurance  Visit Diagnosis: Chronic midline low back pain without sciatica  Muscle weakness  (generalized)     Problem List Patient Active Problem List   Diagnosis Date Noted   Hypokalemia 01/07/2021   Generalized weakness 01/07/2021   Malignant neoplasm of upper-inner quadrant of left breast in female, estrogen receptor positive ([place]) 02/11/2019   Screening breast examination 02/08/2019   Nausea with vomiting 02/09/2015   Pain in the chest    Gastroenteritis    Hypertension    Tobacco abuse    Chest pain 02/08/2015   10:45 AM, 01/31/21 Jerene Pitch, DPT Physical Therapy with [facility]  [phone] office   [facility], [place], [address] Phone: [phone]   Fax:  [phone]  Name: Elizabeth Bullock MRN: [id number] Date of Birth: 03/31/1966

## 2021-02-05 ENCOUNTER — Ambulatory Visit (HOSPITAL_COMMUNITY): Payer: Medicaid Other

## 2021-02-05 ENCOUNTER — Telehealth (HOSPITAL_COMMUNITY): Payer: Self-pay

## 2021-02-05 NOTE — Telephone Encounter (Signed)
No show, called and left message concerning missed apt today.  Reminded next apt date and time wiht contact information included if needs to cancel/reschedule further apts.  Ihor Austin, LPTA/CLT; Delana Meyer [phone]

## 2021-02-07 ENCOUNTER — Ambulatory Visit (HOSPITAL_COMMUNITY): Payer: Medicaid Other | Admitting: Physical Therapy

## 2021-02-07 ENCOUNTER — Telehealth (HOSPITAL_COMMUNITY): Payer: Self-pay | Admitting: Physical Therapy

## 2021-02-07 NOTE — Telephone Encounter (Signed)
No Show #2. Called and left patient VM about missed apt and upcoming apt.   10:17 AM, 02/07/21 Jerene Pitch, DPT Physical Therapy with [facility]  [phone] office

## 2021-02-12 ENCOUNTER — Encounter (HOSPITAL_COMMUNITY): Payer: Medicaid Other

## 2021-02-12 ENCOUNTER — Telehealth (HOSPITAL_COMMUNITY): Payer: Self-pay

## 2021-02-12 NOTE — Telephone Encounter (Signed)
3rd consecutive no show, called concerning missed apt.  Per no show policy pt will be discharged from PT.  If wishes to return to therapy, encouraged to contact referring MD for a new referral.  Ihor Austin, LPTA/CLT; Delana Meyer [phone]

## 2021-02-14 ENCOUNTER — Encounter (HOSPITAL_COMMUNITY): Payer: Medicaid Other | Admitting: Physical Therapy

## 2021-02-20 NOTE — Progress Notes (Signed)
Patient Care Team: Theodoro Clock as PCP - General (Physician Assistant) [name], MD as Consulting Physician (General Surgery) [name], MD as Consulting Physician (Hematology and Oncology) [name], MD as Consulting Physician (Radiation Oncology)  DIAGNOSIS:    ICD-10-CM   1. Malignant neoplasm of upper-inner quadrant of left breast in female, estrogen receptor positive ([place])  C50.212    Z17.0       SUMMARY OF ONCOLOGIC HISTORY: Oncology History  Malignant neoplasm of upper-inner quadrant of left breast in female, estrogen receptor positive ([place])  02/11/2019 Initial Diagnosis   Routine screening mammogram detected a 60m mass with associated architectural distortion in the upper left breast, no axillary adenopathy. 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   02/16/2019 Cancer Staging   Staging form: Breast, AJCC 8th Edition - Clinical stage from 02/16/2019: Stage IA (cT1a, cN0, cM0, G2, ER+, PR+, HER2-) - Signed by GNicholas Lose MD on 02/16/2019   03/15/2019 Surgery   Left lumpectomy ([name]: IDC with DCIS, 0.3cm, grade 1, clear margins. ER positive, PR positive, HER-2 negative. 4 left axillary lymph nodes negative.   03/15/2019 Cancer Staging   Staging form: Breast, AJCC 8th Edition - Pathologic stage from 03/15/2019: Stage IA (pT1a, pN0, cM0, G1, ER+, PR+, HER2-) - Signed by CGardenia Phlegm NP on 07/18/2019   04/12/2019 - 05/10/2019 Radiation Therapy   The patient initially received a dose of 40.05 Gy in 15 fractions to the breast using whole-breast tangent fields. This was delivered using a 3-D conformal technique. The pt received a boost delivering an additional 12 Gy in 6 fractions using a electron boost with 158m electrons. The total dose was 52.05 Gy.   05/2019 -  Anti-estrogen oral therapy   Anastrozole switched to tamoxifen     CHIEF COMPLIANT: Follow-up of left breast  cancer  INTERVAL HISTORY: ShBerenise Bullock a [age].o. with above-mentioned history of left breast cancer who underwent a lumpectomy and radiation therapy, currently on anti-estrogen therapy with anastrozole. She presents to the clinic today for follow-up.  She is tolerating tamoxifen fairly well.  Denies any arthralgias or myalgias denies any hot flashes.  ALLERGIES:  is allergic to codeine, morphine and related, and penicillins.  MEDICATIONS:  Current Outpatient Medications  Medication Sig Dispense Refill   acetaminophen (TYLENOL) 500 MG tablet Take 1,000 mg by mouth 2 (two) times daily as needed for moderate pain or headache.     albuterol (VENTOLIN HFA) 108 (90 Base) MCG/ACT inhaler Inhale 2 puffs into the lungs every 4 (four) hours as needed for wheezing or shortness of breath.     atorvastatin (LIPITOR) 20 MG tablet Take 1 tablet (20 mg total) by mouth daily. 90 tablet 1   cyclobenzaprine (FLEXERIL) 10 MG tablet Take 10 mg by mouth daily.     fenofibrate (TRICOR) 145 MG tablet Take 145 mg by mouth daily.     gabapentin (NEURONTIN) 600 MG tablet Take 300 mg by mouth daily.     ibuprofen (ADVIL) 800 MG tablet Take 1 tablet (800 mg total) by mouth every 6 (six) hours as needed.     loratadine (CLARITIN) 10 MG tablet Take 10 mg by mouth daily as needed for allergies.      Multiple Vitamin (MULTIVITAMIN WITH MINERALS) TABS tablet Take 1 tablet by mouth daily.     omeprazole (PRILOSEC) 40 MG capsule Take 1 capsule by mouth every morning.  pramipexole (MIRAPEX) 0.5 MG tablet Take 0.5 mg by mouth at bedtime.     primidone (MYSOLINE) 50 MG tablet Take 50 mg by mouth daily.     tamoxifen (NOLVADEX) 10 MG tablet One tab by mouth once daily 90 tablet 3   venlafaxine XR (EFFEXOR-XR) 150 MG 24 hr capsule Take 150 mg by mouth daily with breakfast.     No current facility-administered medications for this visit.    PHYSICAL EXAMINATION: ECOG PERFORMANCE STATUS: 1 - Symptomatic but completely  ambulatory  Vitals:   02/21/21 1410  BP: 118/83  Pulse: 78  Resp: 18  Temp: 97.6 F (36.4 C)  SpO2: 100%   Filed Weights   02/21/21 1410  Weight: 172 lb 9.6 oz (78.3 kg)    BREAST: No palpable masses or nodules in either right or left breasts. No palpable axillary supraclavicular or infraclavicular adenopathy no breast tenderness or nipple discharge. (exam performed in the presence of a chaperone)  LABORATORY DATA:  I have reviewed the data as listed CMP Latest Ref Rng & Units 01/09/2021 01/08/2021 01/08/2021  Glucose 70 - 99 mg/dL 101(H) - 111(H)  BUN 6 - 20 mg/dL 9 - 14  Creatinine 0.44 - 1.00 mg/dL 0.82 - 1.01(H)  Sodium 135 - 145 mmol/L 140 - 136  Potassium 3.5 - 5.1 mmol/L 4.4 2.7(LL) <2.0(LL)  Chloride 98 - 111 mmol/L 118(H) - 108  CO2 22 - 32 mmol/L 15(L) - 18(L)  Calcium 8.9 - 10.3 mg/dL 8.1(L) - 8.3(L)  Total Protein 6.5 - 8.1 g/dL 5.4(L) - 6.0(L)  Total Bilirubin 0.3 - 1.2 mg/dL 0.4 - 0.5  Alkaline Phos 38 - 126 U/L 35(L) - 39  AST 15 - 41 U/L 93(H) - 103(H)  ALT 0 - 44 U/L 62(H) - 65(H)    Lab Results  Component Value Date   WBC 7.2 01/08/2021   HGB 12.1 01/08/2021   HCT 34.2 (L) 01/08/2021   MCV 84.9 01/08/2021   PLT 355 01/08/2021   NEUTROABS 6.5 01/07/2021    ASSESSMENT & PLAN:  Malignant neoplasm of upper-inner quadrant of left breast in female, estrogen receptor positive ([place]) 03/15/2019:Left lumpectomy [name]): IDC with DCIS, 0.3cm, grade 1, clear margins, and 4 left axillary lymph nodes negative.  ER 100%, PR 90%, Ki-67 10%, HER-2 negative, T1 a N0 stage Ia Adjuvant radiation 04/13/2019-05/10/2019 Anastrozole from 05/2019 through 11/2019--stopped due to significant arthralgias. Switched to letrozole 01/10/2020 discontinued 02/14/2020 switched to tamoxifen 03/06/2020 ______________________________________________________________________________________________   Tamoxifen toxicities: Started March 06, 2020.  No major side effects to tamoxifen.   She  continues to smoke cigarettes.  She is not yet ready to quit.   Breast cancer surveillance: 1. Mammogram she will need a mammogram to be performed. 2. breast exam August 2022: Benign   Return to clinic in 1 year for follow-up    No orders of the defined types were placed in this encounter.  The patient has a good understanding of the overall plan. she agrees with it. she will call with any problems that may develop before the next visit here.  Total time spent: 20 mins including face to face time and time spent for planning, charting and coordination of care  Rulon Eisenmenger, MD, MPH 02/21/2021  I, Thana Ates, am acting as scribe for Dr. [name].  I have reviewed the above documentation for accuracy and completeness, and I agree with the above.

## 2021-02-21 ENCOUNTER — Other Ambulatory Visit: Payer: Self-pay

## 2021-02-21 ENCOUNTER — Inpatient Hospital Stay: Payer: Medicaid Other | Attending: Hematology and Oncology | Admitting: Hematology and Oncology

## 2021-02-21 DIAGNOSIS — Z79810 Long term (current) use of selective estrogen receptor modulators (SERMs): Secondary | ICD-10-CM | POA: Diagnosis not present

## 2021-02-21 DIAGNOSIS — Z170 Estrogen receptor positive status [ER+]: Secondary | ICD-10-CM | POA: Insufficient documentation

## 2021-02-21 DIAGNOSIS — C50212 Malignant neoplasm of upper-inner quadrant of left female breast: Secondary | ICD-10-CM | POA: Insufficient documentation

## 2021-02-21 MED ORDER — TAMOXIFEN CITRATE 10 MG PO TABS: ORAL_TABLET | ORAL | 3 refills | Status: DC

## 2021-02-21 NOTE — Assessment & Plan Note (Signed)
03/15/2019:Left lumpectomy Elizabeth Bullock): IDC with DCIS, 0.3cm, grade 1, clear margins, and 4 left axillary lymph nodes negative.ER 100%, PR 90%, Ki-67 10%, HER-2 negative, T1 a N0 stage Ia Adjuvant radiation 04/13/2019-05/10/2019 Anastrozole from 05/2019 through 11/2019--stopped due to significant arthralgias. Switched to letrozole 7/13/2021discontinued 02/14/2020 switched to tamoxifen 03/06/2020 ______________________________________________________________________________________________  Tamoxifen toxicities: Started March 06, 2020.  She continues to smoke cigarettes. She is not yet ready to quit.  Breast cancer surveillance: 1. Mammogram and ultrasound 01/13/2020: No evidence of malignancy in either breast. Stable 3 mm probably normal intramammary lymph node in the axillary tail of the left breast at the location of the surgical scar. Interval 3 small oil cysts left axilla.  2. breast exam July 2021: Benign  Telephone visit at the end of September to review toxicities to tamoxifen.

## 2021-04-11 ENCOUNTER — Emergency Department (HOSPITAL_COMMUNITY)
Admission: EM | Admit: 2021-04-11 | Discharge: 2021-04-11 | Disposition: A | Discharge status: Home / Self Care | Payer: Medicaid Other | Attending: Emergency Medicine | Admitting: Emergency Medicine

## 2021-04-11 ENCOUNTER — Emergency Department (HOSPITAL_COMMUNITY): Payer: Medicaid Other

## 2021-04-11 ENCOUNTER — Other Ambulatory Visit: Payer: Self-pay

## 2021-04-11 ENCOUNTER — Encounter (HOSPITAL_COMMUNITY): Payer: Self-pay | Admitting: Emergency Medicine

## 2021-04-11 DIAGNOSIS — J449 Chronic obstructive pulmonary disease, unspecified: Secondary | ICD-10-CM | POA: Diagnosis not present

## 2021-04-11 DIAGNOSIS — Y93E8 Activity, other personal hygiene: Secondary | ICD-10-CM | POA: Insufficient documentation

## 2021-04-11 DIAGNOSIS — S99911A Unspecified injury of right ankle, initial encounter: Secondary | ICD-10-CM | POA: Diagnosis present

## 2021-04-11 DIAGNOSIS — Z853 Personal history of malignant neoplasm of breast: Secondary | ICD-10-CM | POA: Insufficient documentation

## 2021-04-11 DIAGNOSIS — I10 Essential (primary) hypertension: Secondary | ICD-10-CM | POA: Insufficient documentation

## 2021-04-11 DIAGNOSIS — F17210 Nicotine dependence, cigarettes, uncomplicated: Secondary | ICD-10-CM | POA: Diagnosis not present

## 2021-04-11 DIAGNOSIS — W010XXA Fall on same level from slipping, tripping and stumbling without subsequent striking against object, initial encounter: Secondary | ICD-10-CM | POA: Insufficient documentation

## 2021-04-11 DIAGNOSIS — S8261XA Displaced fracture of lateral malleolus of right fibula, initial encounter for closed fracture: Secondary | ICD-10-CM | POA: Insufficient documentation

## 2021-04-11 NOTE — ED Triage Notes (Signed)
Pt states she fell yesterday injuring right ankle.

## 2021-04-11 NOTE — ED Provider Notes (Signed)
[facility] EMERGENCY DEPARTMENT Provider Note   CSN: [id number] Arrival date & time: 04/11/21  0411     History Chief Complaint  Patient presents with   Ankle Pain    Elizabeth Bullock is a 55 y.o. female.  Patient is a 55 year old female with history of COPD, hypertension, bipolar.  Patient presenting with a right ankle injury.  Patient slipped on a smooth floor while wearing socks, then turned her ankle.  This was yesterday.  She has pain and swelling to the lateral aspect.  She describes pain worsened with bearing weight.  The history is provided by the patient.  Ankle Pain Location:  Ankle Time since incident:  12 hours Injury: yes   Ankle location:  R ankle Pain details:    Quality:  Aching   Radiates to:  Does not radiate   Severity:  Moderate   Onset quality:  Sudden   Timing:  Constant   Progression:  Unchanged     Past Medical History:  Diagnosis Date   Anxiety    Bipolar disorder (HCC)    Breast cancer (HCC)    radiation   Bronchitis, chronic (HCC)    COPD (chronic obstructive pulmonary disease) (HCC)    DDD (degenerative disc disease), cervical    DDD (degenerative disc disease), lumbar    Degenerative disorder of bone    OF BACK   Depression    GERD (gastroesophageal reflux disease)    Headache(784.0)    History of migraines,  takes propanolol for headaches   High cholesterol    Hypertension    Neuropathy    h/o   Personal history of radiation therapy    RLS (restless legs syndrome)    Tobacco abuse    Tremor     Patient Active Problem List   Diagnosis Date Noted   Hypokalemia 01/07/2021   Generalized weakness 01/07/2021   Malignant neoplasm of upper-inner quadrant of left breast in female, estrogen receptor positive ([place]) 02/11/2019   Screening breast examination 02/08/2019   Nausea with vomiting 02/09/2015   Pain in the chest    Gastroenteritis    Hypertension    Tobacco abuse    Chest pain 02/08/2015    Past Surgical History:   Procedure Laterality Date   ABDOMINAL HYSTERECTOMY     ANKLE SURGERY Left    left ankle surgery as teenager per pt   BREAST LUMPECTOMY Left 03/15/2019   BREAST LUMPECTOMY WITH RADIOACTIVE SEED AND SENTINEL LYMPH NODE BIOPSY Left 03/15/2019   Procedure: LEFT BREAST LUMPECTOMY WITH RADIOACTIVE SEED AND AXILLARY SENTINEL LYMPH NODE BIOPSY;  Surgeon: [name], MD;  Location: [facility];  Service: General;  Laterality: Left;   CHOLECYSTECTOMY  03/13/2011   Procedure: LAPAROSCOPIC CHOLECYSTECTOMY;  Surgeon: Donato Heinz;  Location: [facility];  Service: General;  Laterality: N/A;     OB History     Gravida  1   Para      Term      Preterm      AB  1   Living  0      SAB  1   IAB      Ectopic      Multiple      Live Births  0           Family History  Problem Relation Age of Onset   Hypertension Mother    Diabetes Mother    Asthma Mother    Hypertension Father    COPD Father  Heart attack Father    Hyperlipidemia Brother    Stomach cancer Maternal Uncle    Colon polyps Neg Hx    Colon cancer Neg Hx    Esophageal cancer Neg Hx     Social History   Tobacco Use   Smoking status: Every Day    Packs/day: 1.00    Years: 39.00    Pack years: 39.00    Types: Cigarettes   Smokeless tobacco: Never  Vaping Use   Vaping Use: Never used  Substance Use Topics   Alcohol use: No   Drug use: Yes    Types: Marijuana    Comment: uses daily    Home Medications Prior to Admission medications   Medication Sig Start Date End Date Taking? Authorizing Provider  acetaminophen (TYLENOL) 500 MG tablet Take 1,000 mg by mouth 2 (two) times daily as needed for moderate pain or headache.    [provider]  albuterol (VENTOLIN HFA) 108 (90 Base) MCG/ACT inhaler Inhale 2 puffs into the lungs every 4 (four) hours as needed for wheezing or shortness of breath. 03/30/20   [provider]  atorvastatin (LIPITOR) 20 MG tablet Take 1 tablet (20 mg total) by mouth  daily. 01/24/19   Soyla Dryer, PA-C  cyclobenzaprine (FLEXERIL) 10 MG tablet Take 10 mg by mouth daily. 12/10/20   [provider]  fenofibrate (TRICOR) 145 MG tablet Take 145 mg by mouth daily. 12/11/20   [provider]  gabapentin (NEURONTIN) 600 MG tablet Take 300 mg by mouth daily. 07/19/20   [provider]  ibuprofen (ADVIL) 800 MG tablet Take 1 tablet (800 mg total) by mouth every 6 (six) hours as needed. 01/09/21   Patrecia Pour, MD  loratadine (CLARITIN) 10 MG tablet Take 10 mg by mouth daily as needed for allergies.     [provider]  Multiple Vitamin (MULTIVITAMIN WITH MINERALS) TABS tablet Take 1 tablet by mouth daily.    [provider]  omeprazole (PRILOSEC) 40 MG capsule Take 1 capsule by mouth every morning. 10/11/20   [provider]  pramipexole (MIRAPEX) 0.5 MG tablet Take 0.5 mg by mouth at bedtime.    [provider]  primidone (MYSOLINE) 50 MG tablet Take 50 mg by mouth daily. 01/02/21   [provider]  tamoxifen (NOLVADEX) 10 MG tablet One tab by mouth once daily 02/21/21   [name], MD  venlafaxine XR (EFFEXOR-XR) 150 MG 24 hr capsule Take 150 mg by mouth daily with breakfast.    [provider]    Allergies    Codeine, Morphine and related, and Penicillins  Review of Systems   Review of Systems  All other systems reviewed and are negative.  Physical Exam Updated Vital Signs BP 113/70   Pulse 81   Temp (!) 97.5 F (36.4 C) (Oral)   Resp 18   Ht 5\' 2"  (1.575 m)   Wt 79.4 kg   SpO2 99%   BMI 32.01 kg/m   Physical Exam Vitals and nursing note reviewed.  Constitutional:      General: She is not in acute distress.    Appearance: Normal appearance. She is not ill-appearing.  HENT:     Head: Normocephalic and atraumatic.  Pulmonary:     Effort: Pulmonary effort is normal.  Musculoskeletal:     Comments: The right ankle has swelling and mild ecchymosis over the lateral  malleolus.  She has good range of motion.  DP pulses are easily palpable and  motor and sensation are intact throughout the entire foot.  Skin:    General: Skin is warm and dry.  Neurological:     Mental Status: She is alert.    ED Results / Procedures / Treatments   Labs (all labs ordered are listed, but only abnormal results are displayed) Labs Reviewed - No data to display  EKG None  Radiology No results found.  Procedures Procedures   Medications Ordered in ED Medications - No data to display  ED Course  I have reviewed the triage vital signs and the nursing notes.  Pertinent labs & imaging results that were available during my care of the patient were reviewed by me and considered in my medical decision making (see chart for details).    MDM Rules/Calculators/A&P  Patient is a 55 year old female presenting with right ankle injury, the details of which are described in the HPI.  She has swelling and slight ecchymosis over the lateral malleolus.  Her x-rays do show an oblique fracture through the lateral malleolus with slight displacement.  Patient will be placed in a cam walker, advised no weightbearing, and is to follow-up with orthopedics in the next few days.  Final Clinical Impression(s) / ED Diagnoses Final diagnoses:  None    Rx / DC Orders ED Discharge Orders     None        Veryl Speak, MD 04/11/21 [phone]

## 2021-04-11 NOTE — Discharge Instructions (Addendum)
Wear cam walker as applied until followed up by orthopedics.  No weightbearing until followed up by orthopedics.  Ice for 20 minutes every 2 hours while awake for the next 2 days.  Take ibuprofen 600 mg every 6 hours as needed for pain.  You are to follow-up with orthopedics in the next 2 to 3 days.  The contact information for Dr. Aline Brochure has been provided in this discharge summary for you to call and make these arrangements.

## 2021-04-15 ENCOUNTER — Encounter: Payer: Self-pay | Admitting: Orthopedic Surgery

## 2021-04-15 ENCOUNTER — Ambulatory Visit (INDEPENDENT_AMBULATORY_CARE_PROVIDER_SITE_OTHER): Payer: Medicaid Other | Admitting: Orthopedic Surgery

## 2021-04-15 ENCOUNTER — Other Ambulatory Visit: Payer: Self-pay

## 2021-04-15 VITALS — BP 121/78 | HR 95 | Ht 63.5 in | Wt 175.0 lb

## 2021-04-15 DIAGNOSIS — S8264XA Nondisplaced fracture of lateral malleolus of right fibula, initial encounter for closed fracture: Secondary | ICD-10-CM | POA: Diagnosis not present

## 2021-04-15 NOTE — Progress Notes (Signed)
Patient ID: Elizabeth Bullock, female   DOB: 02-01-1966, 55 y.o.   MRN: [id number]  ASSESSMENT AND PLAN : Encounter Diagnosis  Name Primary?   Closed nondisplaced fracture of lateral malleolus of right fibula, initial encounter Yes    The fracture is nondisplaced treated with a cam walker crutches weight-bear as tolerated x-ray in a week    Chief Complaint  Patient presents with   Ankle Injury    Right ankle 04/11/21    HPI Elizabeth Bullock is a 55 y.o. female.  Who presents for evaluation of right ankle fracture  Pain located lateral malleolus injury was 4 days ago mild pain relieved with Tylenol and ibuprofen associated with swelling difficulty weightbearing mechanism fall slipped on wet steps   Review of Systems Review of Systems Negative all systems were reviewed  Past Medical History:  Diagnosis Date   Anxiety    Bipolar disorder ([place])    Breast cancer ([place])    radiation   Bronchitis, chronic (HCC)    COPD (chronic obstructive pulmonary disease) (HCC)    DDD (degenerative disc disease), cervical    DDD (degenerative disc disease), lumbar    Degenerative disorder of bone    OF BACK   Depression    GERD (gastroesophageal reflux disease)    Headache(784.0)    History of migraines,  takes propanolol for headaches   High cholesterol    Hypertension    Neuropathy    h/o   Personal history of radiation therapy    RLS (restless legs syndrome)    Tobacco abuse    Tremor     Past Surgical History:  Procedure Laterality Date   ABDOMINAL HYSTERECTOMY     ANKLE SURGERY Left    left ankle surgery as teenager per pt   BREAST LUMPECTOMY Left 03/15/2019   BREAST LUMPECTOMY WITH RADIOACTIVE SEED AND SENTINEL LYMPH NODE BIOPSY Left 03/15/2019   Procedure: LEFT BREAST LUMPECTOMY WITH RADIOACTIVE SEED AND AXILLARY SENTINEL LYMPH NODE BIOPSY;  Surgeon: [name], MD;  Location: [facility];  Service: General;  Laterality: Left;   CHOLECYSTECTOMY  03/13/2011   Procedure: LAPAROSCOPIC  CHOLECYSTECTOMY;  Surgeon: Donato Heinz;  Location: [facility];  Service: General;  Laterality: N/A;    Family History  Problem Relation Age of Onset   Hypertension Mother    Diabetes Mother    Asthma Mother    Hypertension Father    COPD Father    Heart attack Father    Hyperlipidemia Brother    Stomach cancer Maternal Uncle    Colon polyps Neg Hx    Colon cancer Neg Hx    Esophageal cancer Neg Hx     Social History Social History   Tobacco Use   Smoking status: Every Day    Packs/day: 1.00    Years: 39.00    Pack years: 39.00    Types: Cigarettes   Smokeless tobacco: Never  Vaping Use   Vaping Use: Never used  Substance Use Topics   Alcohol use: No   Drug use: Yes    Types: Marijuana    Comment: uses daily    Allergies  Allergen Reactions   Codeine Hives and Nausea And Vomiting   Morphine And Related Nausea Only   Penicillins Hives    Did it involve swelling of the face/tongue/throat, SOB, or low BP? No Did it involve sudden or severe rash/hives, skin peeling, or any reaction on the inside of your mouth or nose? No Did you need to seek medical attention  at a hospital or doctor's office? No When did it last happen?      10+ years If all above answers are "NO", may proceed with cephalosporin use.     Current Outpatient Medications  Medication Sig Dispense Refill   acetaminophen (TYLENOL) 500 MG tablet Take 1,000 mg by mouth 2 (two) times daily as needed for moderate pain or headache.     albuterol (VENTOLIN HFA) 108 (90 Base) MCG/ACT inhaler Inhale 2 puffs into the lungs every 4 (four) hours as needed for wheezing or shortness of breath.     atorvastatin (LIPITOR) 20 MG tablet Take 1 tablet (20 mg total) by mouth daily. 90 tablet 1   cyclobenzaprine (FLEXERIL) 10 MG tablet Take 10 mg by mouth daily.     fenofibrate (TRICOR) 145 MG tablet Take 145 mg by mouth daily.     gabapentin (NEURONTIN) 600 MG tablet Take 300 mg by mouth daily.     ibuprofen (ADVIL) 800  MG tablet Take 1 tablet (800 mg total) by mouth every 6 (six) hours as needed.     loratadine (CLARITIN) 10 MG tablet Take 10 mg by mouth daily as needed for allergies.      Multiple Vitamin (MULTIVITAMIN WITH MINERALS) TABS tablet Take 1 tablet by mouth daily.     omeprazole (PRILOSEC) 40 MG capsule Take 1 capsule by mouth every morning.     pramipexole (MIRAPEX) 0.5 MG tablet Take 0.5 mg by mouth at bedtime.     primidone (MYSOLINE) 50 MG tablet Take 50 mg by mouth daily.     tamoxifen (NOLVADEX) 10 MG tablet One tab by mouth once daily 90 tablet 3   venlafaxine XR (EFFEXOR-XR) 150 MG 24 hr capsule Take 150 mg by mouth daily with breakfast.     No current facility-administered medications for this visit.       Physical Exam BP 121/78   Pulse 95   Ht 5' 3.5" (1.613 m)   Wt 175 lb (79.4 kg)   BMI 30.51 kg/m   The patient is well developed well nourished and well groomed.  Orientation to person place and time is normal  Mood is pleasant.  Ambulatory status weightbearing as tolerated with crutches and Cam walker   Right ankle examination: Inspection reveals tenderness and swelling over the lateral malleolus. The medial malleolus is none  Range of motion is limited by pain foot is plantigrade. Ankle stability tests were deferred because of pain in the ankle does not appear to be subluxated. Motor exam shows no atrophy  Skin shows mild bruising and ecchymosis. Neurovascular exam is intact.  Opposite ankle : SKIN NORML, ROM NORMAL, MOTOR NO EVIDENCE OF TREMOR, THE JOINT IS REDUCED    MEDICAL DECISION MAKING  A.  Encounter Diagnosis  Name Primary?   Closed nondisplaced fracture of lateral malleolus of right fibula, initial encounter Yes    B. DATA ANALYSED:  ER record  IMAGING: Independent interpretation of images: External images show a nondisplaced lateral malleolus fracture Weber B ankle mortise intact  Orders: No new orders  Outside records reviewed: Yes  C.  MANAGEMENT nonoperative treatment x-ray in 1 week  No orders of the defined types were placed in this encounter.

## 2021-04-17 DIAGNOSIS — S8264XA Nondisplaced fracture of lateral malleolus of right fibula, initial encounter for closed fracture: Secondary | ICD-10-CM | POA: Insufficient documentation

## 2021-04-22 ENCOUNTER — Ambulatory Visit: Payer: Medicaid Other

## 2021-04-22 ENCOUNTER — Other Ambulatory Visit: Payer: Self-pay

## 2021-04-22 ENCOUNTER — Ambulatory Visit (INDEPENDENT_AMBULATORY_CARE_PROVIDER_SITE_OTHER): Payer: Medicaid Other | Admitting: Orthopedic Surgery

## 2021-04-22 DIAGNOSIS — S8264XD Nondisplaced fracture of lateral malleolus of right fibula, subsequent encounter for closed fracture with routine healing: Secondary | ICD-10-CM

## 2021-04-22 MED ORDER — IBUPROFEN 800 MG PO TABS: 800.0000 mg | ORAL_TABLET | Freq: Four times a day (QID) | ORAL | 2 refills | Status: DC | PRN

## 2021-04-22 NOTE — Progress Notes (Signed)
Chief Complaint  Patient presents with   FX CARE    DOI 04/11/21   closed nondisplaced fracture of lateral malleolus of right fibula   Encounter Diagnosis  Name Primary?   Closed nondisplaced fracture of lateral malleolus of right fibula with routine healing, subsequent encounter 04/11/21 Yes    Weber B ankle fracture right  Cam walker weight-bear as tolerated with crutches  Ibuprofen  Clean skin minimal swelling tenderness fracture site  X-ray fracture intact stable  X-ray in 2 weeks continue weight-bear as tolerated with crutches and Cam walker  Meds ordered this encounter  Medications   ibuprofen (ADVIL) 800 MG tablet    Sig: Take 1 tablet (800 mg total) by mouth every 6 (six) hours as needed.    Dispense:  90 tablet    Refill:  2

## 2021-05-06 ENCOUNTER — Ambulatory Visit: Payer: Medicaid Other | Admitting: Orthopedic Surgery

## 2021-05-09 ENCOUNTER — Other Ambulatory Visit: Payer: Self-pay

## 2021-05-09 ENCOUNTER — Ambulatory Visit (INDEPENDENT_AMBULATORY_CARE_PROVIDER_SITE_OTHER): Payer: Medicaid Other | Admitting: Orthopedic Surgery

## 2021-05-09 ENCOUNTER — Ambulatory Visit: Payer: Medicaid Other

## 2021-05-09 DIAGNOSIS — S8264XD Nondisplaced fracture of lateral malleolus of right fibula, subsequent encounter for closed fracture with routine healing: Secondary | ICD-10-CM

## 2021-05-09 NOTE — Progress Notes (Signed)
Chief Complaint  Patient presents with   Fracture    Rt foot DOI 04/11/21   55 year old female fractured her right ankle she has a Weber B fracture she is doing well she is in a boot with crutches  Her ankle looks good the skin is intact she still has some tenderness over the fracture site  X-ray shows a stable mortise with healing occurring  Recommend 4-week x-ray continue cam walker

## 2021-06-06 ENCOUNTER — Other Ambulatory Visit: Payer: Self-pay

## 2021-06-06 ENCOUNTER — Encounter: Payer: Self-pay | Admitting: Orthopedic Surgery

## 2021-06-06 ENCOUNTER — Ambulatory Visit: Payer: Medicaid Other

## 2021-06-06 ENCOUNTER — Ambulatory Visit (INDEPENDENT_AMBULATORY_CARE_PROVIDER_SITE_OTHER): Payer: Medicaid Other | Admitting: Orthopedic Surgery

## 2021-06-06 DIAGNOSIS — S8264XD Nondisplaced fracture of lateral malleolus of right fibula, subsequent encounter for closed fracture with routine healing: Secondary | ICD-10-CM

## 2021-06-06 NOTE — Progress Notes (Signed)
Chief Complaint  Patient presents with   Ankle Injury    04/11/21 right ankle fracture improving has some occasional pains but ready to get out of cam boot     Encounter Diagnosis  Name Primary?   Closed nondisplaced fracture of lateral malleolus of right fibula with routine healing, subsequent encounter 04/11/21 Yes    Fracture care follow-up lateral malleolus fracture  The patient is doing well she wishes to remove the boot  X-rays were taken and the fracture looks healed the mortise is intact  The clinical exam was normal  Recommend stopping the cam walker and resume normal activities

## 2021-06-17 ENCOUNTER — Telehealth: Payer: Self-pay | Admitting: Orthopedic Surgery

## 2021-06-17 NOTE — Telephone Encounter (Signed)
Patient called to relay that last week, she had a minor fall, and as of yesterday, her same right ankle "may be re-fractured". As of recent visit, 06/06/21, patient was taken out of boot and was cleared to resume normal activity. I discussed option of Emergency room or urgent care for workup.  Based on current template, please advise if patient is to proceed in this matter and then schedule office visit?  [phone]

## 2021-06-17 NOTE — Telephone Encounter (Signed)
Called pt to offer - reached voice mail, left message.

## 2021-06-17 NOTE — Telephone Encounter (Signed)
No urgent care   Bring her in tues pm

## 2021-06-17 NOTE — Telephone Encounter (Signed)
Spoke with patient; aware of appointment.

## 2021-06-18 ENCOUNTER — Other Ambulatory Visit: Payer: Self-pay

## 2021-06-18 ENCOUNTER — Encounter: Payer: Self-pay | Admitting: Orthopedic Surgery

## 2021-06-18 ENCOUNTER — Other Ambulatory Visit: Payer: Self-pay | Admitting: Radiology

## 2021-06-18 ENCOUNTER — Ambulatory Visit (INDEPENDENT_AMBULATORY_CARE_PROVIDER_SITE_OTHER): Payer: Medicaid Other | Admitting: Orthopedic Surgery

## 2021-06-18 VITALS — BP 119/76 | HR 86 | Ht 63.5 in | Wt 180.0 lb

## 2021-06-18 DIAGNOSIS — S99911A Unspecified injury of right ankle, initial encounter: Secondary | ICD-10-CM

## 2021-06-18 MED ORDER — HYDROCODONE-ACETAMINOPHEN 5-325 MG PO TABS: 1.0000 | ORAL_TABLET | Freq: Four times a day (QID) | ORAL | 0 refills | Status: DC | PRN

## 2021-06-18 NOTE — Progress Notes (Signed)
Chief Complaint  Patient presents with   Ankle Injury    New injury to right ankle. Fell on 06/15/21 playing with grand kids     HPI: 55 year old female had a nondisplaced fracture of the lateral malleolus released on 8 December then she fell again x-rays show no new fracture ankle mortise intact complains of lateral ankle pain  Past Medical History:  Diagnosis Date   Anxiety    Bipolar disorder ([place])    Breast cancer ([place])    radiation   Bronchitis, chronic (HCC)    COPD (chronic obstructive pulmonary disease) (HCC)    DDD (degenerative disc disease), cervical    DDD (degenerative disc disease), lumbar    Degenerative disorder of bone    OF BACK   Depression    GERD (gastroesophageal reflux disease)    Headache(784.0)    History of migraines,  takes propanolol for headaches   High cholesterol    Hypertension    Neuropathy    h/o   Personal history of radiation therapy    RLS (restless legs syndrome)    Tobacco abuse    Tremor     BP 119/76    Pulse 86    Ht 5' 3.5" (1.613 m)    Wt 180 lb (81.6 kg)    BMI 31.39 kg/m    General appearance: Well-developed well-nourished no gross deformities  Cardiovascular normal pulse and perfusion normal color without edema  Neurologically no sensation loss or deficits or pathologic reflexes  Psychological: Awake alert and oriented x3 mood and affect normal  Skin no lacerations or ulcerations no nodularity no palpable masses, no erythema or nodularity  Musculoskeletal: Swelling and tenderness over the lateral malleolus foot alignment looks normal  Imaging outside imaging my interpretation old fractures of the lateral malleolus no new fractures  A/P  Recommend weightbearing as tolerated in a cam walker follow-up in 4 weeks  Meds ordered this encounter  Medications   HYDROcodone-acetaminophen (NORCO/VICODIN) 5-325 MG tablet    Sig: Take 1 tablet by mouth every 6 (six) hours as needed for moderate pain.    Dispense:  30 tablet     Refill:  0

## 2021-06-18 NOTE — Telephone Encounter (Signed)
Request received via fax for prior authorization/ MEDICAID has rejected  Need to resend Hydrocodone for 5 day supply

## 2021-07-18 ENCOUNTER — Ambulatory Visit: Payer: Medicaid Other | Admitting: Orthopedic Surgery

## 2021-10-15 ENCOUNTER — Encounter: Payer: Self-pay | Admitting: Orthopaedic Surgery

## 2021-10-15 ENCOUNTER — Ambulatory Visit (INDEPENDENT_AMBULATORY_CARE_PROVIDER_SITE_OTHER): Payer: Medicaid Other | Admitting: Orthopaedic Surgery

## 2021-10-15 ENCOUNTER — Ambulatory Visit (INDEPENDENT_AMBULATORY_CARE_PROVIDER_SITE_OTHER): Payer: Medicaid Other

## 2021-10-15 VITALS — BP 129/78 | HR 82 | Ht 63.5 in | Wt 187.6 lb

## 2021-10-15 DIAGNOSIS — G8929 Other chronic pain: Secondary | ICD-10-CM

## 2021-10-15 DIAGNOSIS — K219 Gastro-esophageal reflux disease without esophagitis: Secondary | ICD-10-CM | POA: Diagnosis not present

## 2021-10-15 DIAGNOSIS — M25512 Pain in left shoulder: Secondary | ICD-10-CM | POA: Diagnosis not present

## 2021-10-15 DIAGNOSIS — G629 Polyneuropathy, unspecified: Secondary | ICD-10-CM | POA: Diagnosis not present

## 2021-10-15 NOTE — Progress Notes (Signed)
? ?Subjective:  ? ? Patient ID: Elizabeth Bullock, female    DOB: September 19, 1965, 56 y.o.   MRN: [id number] ? ?HPI ?My left shoulder hurts.  She has had pain in the left shoulder for several months.  She sleeps on her left side and shoulder. She started having pain in the anterior and distal clavicle area.  She has no numbness,no redness, no swelling.  She has tried ibuprofen and heat and ice with no help.  It hurts to raise her arm over her head and sleeping on it.  She is tired of it hurting. She has tried sleeping in recliner.   ? ?She is on Neurontin for peripheral neuropathy of the feet. She has GERD also and is limited with NSAIDs. ? ? ?Review of Systems  ?Constitutional:  Positive for activity change.  ?Respiratory:  Positive for shortness of breath.   ?Musculoskeletal:  Positive for arthralgias, back pain and joint swelling.  ?Psychiatric/Behavioral:  The patient is nervous/anxious.   ?All other systems reviewed and are negative. ?For Review of Systems, all other systems reviewed and are negative. ? ?The following is a summary of the past history medically, past history surgically, known current medicines, social history and family history.  This information is gathered electronically by the computer from prior information and documentation.  I review this each visit and have found including this information at this point in the chart is beneficial and informative.  ? ?Past Medical History:  ?Diagnosis Date  ? Anxiety   ? Bipolar disorder ([place])   ? Breast cancer ([place])   ? radiation  ? Bronchitis, chronic ([place])   ? COPD (chronic obstructive pulmonary disease) ([place])   ? DDD (degenerative disc disease), cervical   ? DDD (degenerative disc disease), lumbar   ? Degenerative disorder of bone   ? OF BACK  ? Depression   ? GERD (gastroesophageal reflux disease)   ? Headache(784.0)   ? History of migraines,  takes propanolol for headaches  ? High cholesterol   ? Hypertension   ? Neuropathy   ? h/o  ? Personal history of  radiation therapy   ? RLS (restless legs syndrome)   ? Tobacco abuse   ? Tremor   ? ? ?Past Surgical History:  ?Procedure Laterality Date  ? ABDOMINAL HYSTERECTOMY    ? ANKLE SURGERY Left   ? left ankle surgery as teenager per pt  ? BREAST LUMPECTOMY Left 03/15/2019  ? BREAST LUMPECTOMY WITH RADIOACTIVE SEED AND SENTINEL LYMPH NODE BIOPSY Left 03/15/2019  ? Procedure: LEFT BREAST LUMPECTOMY WITH RADIOACTIVE SEED AND AXILLARY SENTINEL LYMPH NODE BIOPSY;  Surgeon: [name], MD;  Location: [facility];  Service: General;  Laterality: Left;  ? CHOLECYSTECTOMY  03/13/2011  ? Procedure: LAPAROSCOPIC CHOLECYSTECTOMY;  Surgeon: Donato Heinz;  Location: [facility];  Service: General;  Laterality: N/A;  ? ? ?Current Outpatient Medications on File Prior to Visit  ?Medication Sig Dispense Refill  ? acetaminophen (TYLENOL) 500 MG tablet Take 1,000 mg by mouth 2 (two) times daily as needed for moderate pain or headache.    ? albuterol (VENTOLIN HFA) 108 (90 Base) MCG/ACT inhaler Inhale 2 puffs into the lungs every 4 (four) hours as needed for wheezing or shortness of breath.    ? atorvastatin (LIPITOR) 20 MG tablet Take 1 tablet (20 mg total) by mouth daily. 90 tablet 1  ? cyclobenzaprine (FLEXERIL) 10 MG tablet Take 10 mg by mouth daily.    ? fenofibrate (TRICOR) 145 MG tablet Take 145  mg by mouth daily.    ? gabapentin (NEURONTIN) 300 MG capsule Take by mouth.    ? HYDROcodone-acetaminophen (NORCO/VICODIN) 5-325 MG tablet Take 1 tablet by mouth every 6 (six) hours as needed for moderate pain. 20 tablet 0  ? ibuprofen (ADVIL) 800 MG tablet Take 1 tablet (800 mg total) by mouth every 6 (six) hours as needed. 90 tablet 2  ? loratadine (CLARITIN) 10 MG tablet Take 10 mg by mouth daily as needed for allergies.     ? montelukast (SINGULAIR) 10 MG tablet Take 10 mg by mouth daily.    ? Multiple Vitamin (MULTIVITAMIN WITH MINERALS) TABS tablet Take 1 tablet by mouth daily.    ? omeprazole (PRILOSEC) 40 MG capsule Take 1 capsule by mouth  every morning.    ? pramipexole (MIRAPEX) 0.5 MG tablet Take 0.5 mg by mouth at bedtime.    ? primidone (MYSOLINE) 50 MG tablet Take 50 mg by mouth daily.    ? SYMBICORT 160-4.5 MCG/ACT inhaler Inhale into the lungs.    ? tamoxifen (NOLVADEX) 10 MG tablet One tab by mouth once daily 90 tablet 3  ? venlafaxine XR (EFFEXOR-XR) 150 MG 24 hr capsule Take 150 mg by mouth daily with breakfast.    ? ?No current facility-administered medications on file prior to visit.  ? ? ?Social History  ? ?Socioeconomic History  ? Marital status: Divorced  ?  Spouse name: Not on file  ? Number of children: 0  ? Years of education: Not on file  ? Highest education level: 9th grade  ?Occupational History  ? Not on file  ?Tobacco Use  ? Smoking status: Every Day  ?  Packs/day: 1.00  ?  Years: 39.00  ?  Pack years: 39.00  ?  Types: Cigarettes  ? Smokeless tobacco: Never  ?Vaping Use  ? Vaping Use: Never used  ?Substance and Sexual Activity  ? Alcohol use: No  ? Drug use: Yes  ?  Types: Marijuana  ?  Comment: uses daily  ? Sexual activity: Yes  ?  Birth control/protection: Surgical  ?Other Topics Concern  ? Not on file  ?Social History Narrative  ? Not on file  ? ?Social Determinants of Health  ? ?Financial Resource Strain: Not on file  ?Food Insecurity: Not on file  ?Transportation Needs: Not on file  ?Physical Activity: Not on file  ?Stress: Not on file  ?Social Connections: Not on file  ?Intimate Partner Violence: Not on file  ? ? ?Family History  ?Problem Relation Age of Onset  ? Hypertension Mother   ? Diabetes Mother   ? Asthma Mother   ? Hypertension Father   ? COPD Father   ? Heart attack Father   ? Hyperlipidemia Brother   ? Stomach cancer Maternal Uncle   ? Colon polyps Neg Hx   ? Colon cancer Neg Hx   ? Esophageal cancer Neg Hx   ? ? ?BP 129/78   Pulse 82   Ht 5' 3.5" (1.613 m)   Wt 187 lb 9.6 oz (85.1 kg)   BMI 32.71 kg/m?  ? ?Body mass index is 32.71 kg/m?. ? ?   ?Objective:  ? Physical Exam ?Vitals and nursing note  reviewed. Exam conducted with a chaperone present.  ?Constitutional:   ?   Appearance: She is well-developed.  ?HENT:  ?   Head: Normocephalic and atraumatic.  ?Eyes:  ?   Conjunctiva/sclera: Conjunctivae normal.  ?   Pupils: Pupils are equal, round, and reactive  to light.  ?Cardiovascular:  ?   Rate and Rhythm: Normal rate and regular rhythm.  ?Pulmonary:  ?   Effort: Pulmonary effort is normal.  ?Abdominal:  ?   Palpations: Abdomen is soft.  ?Musculoskeletal:  ?     Arms: ? ?   Cervical back: Normal range of motion and neck supple.  ?Skin: ?   General: Skin is warm and dry.  ?Neurological:  ?   Mental Status: She is alert and oriented to person, place, and time.  ?   Cranial Nerves: No cranial nerve deficit.  ?   Motor: No abnormal muscle tone.  ?   Coordination: Coordination normal.  ?   Deep Tendon Reflexes: Reflexes are normal and symmetric. Reflexes normal.  ?Psychiatric:     ?   Behavior: Behavior normal.     ?   Thought Content: Thought content normal.     ?   Judgment: Judgment normal.  ?X-rays were done of the left shoulder, reported separately. ? ? ? ? ?   ?Assessment & Plan:  ? ?Encounter Diagnoses  ?Name Primary?  ? Chronic left shoulder pain Yes  ? Neuropathy   ? Gastroesophageal reflux disease without esophagitis   ? ?PROCEDURE NOTE: ? ?The patient request injection, verbal consent was obtained. ? ?The left shoulder was prepped appropriately after time out was performed.  ? ?Sterile technique was observed and injection of 1 cc of DepoMedrol '40mg'$  with several cc's of plain xylocaine. Anesthesia was provided by ethyl chloride and a 20-gauge needle was used to inject the shoulder area. A posterior approach was used.  The injection was tolerated well. ? ?A band aid dressing was applied. ? ?The patient was advised to apply ice later today and tomorrow to the injection sight as needed. ? ?I will see her in two weeks. ? ?Call if any problem. ? ?Precautions discussed. ? ?Electronically Signed ?Sanjuana Kava,  MD ?4/18/20239:15 AM ? ?

## 2021-10-29 ENCOUNTER — Ambulatory Visit (INDEPENDENT_AMBULATORY_CARE_PROVIDER_SITE_OTHER): Payer: Medicaid Other | Admitting: Orthopaedic Surgery

## 2021-10-29 ENCOUNTER — Encounter: Payer: Self-pay | Admitting: Orthopaedic Surgery

## 2021-10-29 DIAGNOSIS — G629 Polyneuropathy, unspecified: Secondary | ICD-10-CM

## 2021-10-29 DIAGNOSIS — M25512 Pain in left shoulder: Secondary | ICD-10-CM | POA: Diagnosis not present

## 2021-10-29 DIAGNOSIS — G8929 Other chronic pain: Secondary | ICD-10-CM

## 2021-10-29 DIAGNOSIS — K219 Gastro-esophageal reflux disease without esophagitis: Secondary | ICD-10-CM

## 2021-10-29 NOTE — Progress Notes (Signed)
My shoulder is sore. ? ?She had good results with the injection to the left shoulder for about ten days but pain has returned.  Her OT appointment is for May 31, the earliest they had.  She has pain with overhead use. She cannot take NSAIDs. ? ?Examination of left Upper Extremity is done. ? Inspection: ?  Overall:  Elbow non-tender without crepitus or defects, forearm non-tender without crepitus or defects, wrist non-tender without crepitus or defects, hand non-tender.  ?  Shoulder: with glenohumeral joint tenderness, without effusion. ?  Upper arm:  without swelling and tenderness ? ? Range of motion: ?  Overall:  Full range of motion of the elbow, full range of motion of wrist and full range of motion in fingers. ?  Shoulder:  left  145 degrees forward flexion; 100 degrees abduction; 25 degrees internal rotation, 25 degrees external rotation, 5 degrees extension, 40 degrees adduction. ? ? Stability: ?  Overall:  Shoulder, elbow and wrist stable ? ? Strength and Tone: ?  Overall full shoulder muscles strength, full upper arm strength and normal upper arm bulk and tone.  ? ?Encounter Diagnoses  ?Name Primary?  ? Chronic left shoulder pain Yes  ? Neuropathy   ? Gastroesophageal reflux disease without esophagitis   ? ?Keep the OT appointment. ? ?I will see her in one month. ? ?Call if any problem. ? ?Precautions discussed. ? ?Electronically Signed ?Sanjuana Kava, MD ?5/2/20239:44 AM ? ?

## 2021-11-20 ENCOUNTER — Other Ambulatory Visit: Payer: Self-pay | Admitting: Hematology and Oncology

## 2021-11-27 ENCOUNTER — Ambulatory Visit (HOSPITAL_COMMUNITY): Payer: Medicaid Other | Attending: Physician Assistant | Admitting: Occupational Therapy

## 2021-12-03 ENCOUNTER — Encounter: Payer: Self-pay | Admitting: Orthopaedic Surgery

## 2021-12-03 ENCOUNTER — Ambulatory Visit (INDEPENDENT_AMBULATORY_CARE_PROVIDER_SITE_OTHER): Payer: Medicaid Other | Admitting: Orthopaedic Surgery

## 2021-12-03 VITALS — BP 132/80 | HR 80 | Ht 63.5 in | Wt 200.0 lb

## 2021-12-03 DIAGNOSIS — G8929 Other chronic pain: Secondary | ICD-10-CM | POA: Diagnosis not present

## 2021-12-03 DIAGNOSIS — M25512 Pain in left shoulder: Secondary | ICD-10-CM

## 2021-12-03 NOTE — Progress Notes (Signed)
PROCEDURE NOTE:  The patient request injection, verbal consent was obtained.  The left shoulder was prepped appropriately after time out was performed.   Sterile technique was observed and injection of 1 cc of DepoMedrol '40mg'$  with several cc's of plain xylocaine. Anesthesia was provided by ethyl chloride and a 20-gauge needle was used to inject the shoulder area. A posterior approach was used.  The injection was tolerated well.  A band aid dressing was applied.  The patient was advised to apply ice later today and tomorrow to the injection sight as needed.  Encounter Diagnosis  Name Primary?   Chronic left shoulder pain Yes   She did not get to go to OT as she overslept that day.  She is rescheduled for early July.  Return in six weeks.  Go to OT.  Call if any problem.  Precautions discussed.  Electronically Signed Sanjuana Kava, MD 6/6/202310:05 AM

## 2022-01-09 ENCOUNTER — Ambulatory Visit (HOSPITAL_COMMUNITY): Payer: Medicaid Other | Attending: Physician Assistant

## 2022-01-14 ENCOUNTER — Ambulatory Visit: Payer: Medicaid Other | Admitting: Orthopaedic Surgery

## 2022-01-29 ENCOUNTER — Telehealth: Payer: Self-pay | Admitting: Hematology and Oncology

## 2022-01-29 NOTE — Telephone Encounter (Signed)
Rescheduled appointment per provider PAL. Left voicemail. 

## 2022-02-21 ENCOUNTER — Inpatient Hospital Stay: Payer: Medicaid Other | Admitting: Hematology and Oncology

## 2022-03-07 NOTE — Progress Notes (Signed)
Patient Care Team: Theodoro Clock as PCP - General (Physician Assistant) [name], MD as Consulting Physician (General Surgery) [name], MD as Consulting Physician (Hematology and Oncology) [name], MD as Consulting Physician (Radiation Oncology)  DIAGNOSIS: No diagnosis found.  SUMMARY OF ONCOLOGIC HISTORY: Oncology History  Malignant neoplasm of upper-inner quadrant of left breast in female, estrogen receptor positive ([place])  02/11/2019 Initial Diagnosis   Routine screening mammogram detected a 63m mass with associated architectural distortion in the upper left breast, no axillary adenopathy. 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   02/16/2019 Cancer Staging   Staging form: Breast, AJCC 8th Edition - Clinical stage from 02/16/2019: Stage IA (cT1a, cN0, cM0, G2, ER+, PR+, HER2-) - Signed by GNicholas Lose MD on 02/16/2019   03/15/2019 Surgery   Left lumpectomy ([name]: IDC with DCIS, 0.3cm, grade 1, clear margins. ER positive, PR positive, HER-2 negative. 4 left axillary lymph nodes negative.   03/15/2019 Cancer Staging   Staging form: Breast, AJCC 8th Edition - Pathologic stage from 03/15/2019: Stage IA (pT1a, pN0, cM0, G1, ER+, PR+, HER2-) - Signed by CGardenia Phlegm NP on 07/18/2019   04/12/2019 - 05/10/2019 Radiation Therapy   The patient initially received a dose of 40.05 Gy in 15 fractions to the breast using whole-breast tangent fields. This was delivered using a 3-D conformal technique. The pt received a boost delivering an additional 12 Gy in 6 fractions using a electron boost with 131m electrons. The total dose was 52.05 Gy.   05/2019 -  Anti-estrogen oral therapy   Anastrozole switched to tamoxifen     CHIEF COMPLIANT:  Follow-up of left breast cancer    INTERVAL HISTORY: Elizabeth Bullock a [age].o. with above-mentioned history of left breast cancer who underwent a lumpectomy  and radiation therapy, currently on anti-estrogen therapy with anastrozole. She presents to the clinic today for follow-up.    ALLERGIES:  is allergic to codeine, morphine and related, and penicillins.  MEDICATIONS:  Current Outpatient Medications  Medication Sig Dispense Refill   acetaminophen (TYLENOL) 500 MG tablet Take 1,000 mg by mouth 2 (two) times daily as needed for moderate pain or headache.     albuterol (VENTOLIN HFA) 108 (90 Base) MCG/ACT inhaler Inhale 2 puffs into the lungs every 4 (four) hours as needed for wheezing or shortness of breath.     atorvastatin (LIPITOR) 20 MG tablet Take 1 tablet (20 mg total) by mouth daily. 90 tablet 1   cyclobenzaprine (FLEXERIL) 10 MG tablet Take 10 mg by mouth daily.     fenofibrate (TRICOR) 145 MG tablet Take 145 mg by mouth daily.     gabapentin (NEURONTIN) 300 MG capsule Take by mouth.     HYDROcodone-acetaminophen (NORCO/VICODIN) 5-325 MG tablet Take 1 tablet by mouth every 6 (six) hours as needed for moderate pain. 20 tablet 0   ibuprofen (ADVIL) 800 MG tablet Take 1 tablet (800 mg total) by mouth every 6 (six) hours as needed. 90 tablet 2   loratadine (CLARITIN) 10 MG tablet Take 10 mg by mouth daily as needed for allergies.      montelukast (SINGULAIR) 10 MG tablet Take 10 mg by mouth daily.     Multiple Vitamin (MULTIVITAMIN WITH MINERALS) TABS tablet Take 1 tablet by mouth daily.     omeprazole (PRILOSEC) 40 MG capsule Take 1 capsule by mouth every morning.     pramipexole (MIRAPEX) 0.5 MG  tablet Take 0.5 mg by mouth at bedtime.     primidone (MYSOLINE) 50 MG tablet Take 50 mg by mouth daily.     SYMBICORT 160-4.5 MCG/ACT inhaler Inhale into the lungs.     tamoxifen (NOLVADEX) 10 MG tablet Take 1 tablet by mouth once daily 90 tablet 0   venlafaxine XR (EFFEXOR-XR) 150 MG 24 hr capsule Take 150 mg by mouth daily with breakfast.     No current facility-administered medications for this visit.    PHYSICAL EXAMINATION: ECOG  PERFORMANCE STATUS: {CHL ONC ECOG [id number]}  There were no vitals filed for this visit. There were no vitals filed for this visit.  BREAST:*** No palpable masses or nodules in either right or left breasts. No palpable axillary supraclavicular or infraclavicular adenopathy no breast tenderness or nipple discharge. (exam performed in the presence of a chaperone)  LABORATORY DATA:  I have reviewed the data as listed    Latest Ref Rng & Units 01/09/2021    5:01 AM 01/08/2021    5:24 PM 01/08/2021    6:15 AM  CMP  Glucose 70 - 99 mg/dL 101   111   BUN 6 - 20 mg/dL 9   14   Creatinine 0.44 - 1.00 mg/dL 0.82   1.01   Sodium 135 - 145 mmol/L 140   136   Potassium 3.5 - 5.1 mmol/L 4.4  2.7  <2.0   Chloride 98 - 111 mmol/L 118   108   CO2 22 - 32 mmol/L 15   18   Calcium 8.9 - 10.3 mg/dL 8.1   8.3   Total Protein 6.5 - 8.1 g/dL 5.4   6.0   Total Bilirubin 0.3 - 1.2 mg/dL 0.4   0.5   Alkaline Phos 38 - 126 U/L 35   39   AST 15 - 41 U/L 93   103   ALT 0 - 44 U/L 62   65     Lab Results  Component Value Date   WBC 7.2 01/08/2021   HGB 12.1 01/08/2021   HCT 34.2 (L) 01/08/2021   MCV 84.9 01/08/2021   PLT 355 01/08/2021   NEUTROABS 6.5 01/07/2021    ASSESSMENT & PLAN:  No problem-specific Assessment & Plan notes found for this encounter.    No orders of the defined types were placed in this encounter.  The patient has a good understanding of the overall plan. she agrees with it. she will call with any problems that may develop before the next visit here. Total time spent: 30 mins including face to face time and time spent for planning, charting and co-ordination of care   Suzzette Righter, [place] 03/07/22    I Gardiner Coins am scribing for Dr. Lindi Adie  ***

## 2022-03-11 ENCOUNTER — Inpatient Hospital Stay: Payer: Medicaid Other | Attending: Hematology and Oncology | Admitting: Hematology and Oncology

## 2022-03-11 ENCOUNTER — Other Ambulatory Visit: Payer: Self-pay

## 2022-03-11 ENCOUNTER — Other Ambulatory Visit: Payer: Self-pay | Admitting: Physician Assistant

## 2022-03-11 ENCOUNTER — Ambulatory Visit
Admission: RE | Admit: 2022-03-11 | Discharge: 2022-03-11 | Disposition: A | Discharge status: Home / Self Care | Payer: Medicaid Other | Source: Ambulatory Visit | Attending: Physician Assistant | Admitting: Physician Assistant

## 2022-03-11 DIAGNOSIS — Z79810 Long term (current) use of selective estrogen receptor modulators (SERMs): Secondary | ICD-10-CM | POA: Insufficient documentation

## 2022-03-11 DIAGNOSIS — M5136 Other intervertebral disc degeneration, lumbar region: Secondary | ICD-10-CM

## 2022-03-11 DIAGNOSIS — M25561 Pain in right knee: Secondary | ICD-10-CM

## 2022-03-11 DIAGNOSIS — Z923 Personal history of irradiation: Secondary | ICD-10-CM | POA: Diagnosis not present

## 2022-03-11 DIAGNOSIS — R296 Repeated falls: Secondary | ICD-10-CM | POA: Diagnosis not present

## 2022-03-11 DIAGNOSIS — C50212 Malignant neoplasm of upper-inner quadrant of left female breast: Secondary | ICD-10-CM | POA: Diagnosis not present

## 2022-03-11 DIAGNOSIS — Z170 Estrogen receptor positive status [ER+]: Secondary | ICD-10-CM | POA: Diagnosis not present

## 2022-03-11 DIAGNOSIS — F17210 Nicotine dependence, cigarettes, uncomplicated: Secondary | ICD-10-CM | POA: Diagnosis not present

## 2022-03-11 MED ORDER — TAMOXIFEN CITRATE 10 MG PO TABS: ORAL_TABLET | ORAL | 3 refills | Status: DC

## 2022-03-11 MED ORDER — PRAMIPEXOLE DIHYDROCHLORIDE 1.5 MG PO TABS: 1.5000 mg | ORAL_TABLET | Freq: Every day | ORAL | Status: AC

## 2022-03-11 NOTE — Assessment & Plan Note (Signed)
03/15/2019:Left lumpectomy Elizabeth Bullock): IDC with DCIS, 0.3cm, grade 1, clear margins, and 4 left axillary lymph nodes negative.ER 100%, PR 90%, Ki-67 10%, HER-2 negative, T1 a N0 stage Ia Adjuvant radiation 04/13/2019-05/10/2019 Anastrozole from 05/2019 through 11/2019--stopped due to significant arthralgias.Switched to letrozole 7/13/2021discontinued 02/14/2020 switched to tamoxifen 03/06/2020 ______________________________________________________________________________________________  Tamoxifen toxicities: Started March 06, 2020.  No major side effects to tamoxifen.  She continues to smoke cigarettes. She is not yet ready to quit.  Breast cancer surveillance: 1.Mammogram she will need a mammogram to be performed. 2.breast exam  03/11/2022: Benign  Return to clinic in 1 year for follow-up

## 2022-04-03 ENCOUNTER — Encounter: Payer: Self-pay | Admitting: Hematology and Oncology

## 2022-07-31 ENCOUNTER — Other Ambulatory Visit: Payer: Self-pay | Admitting: Nurse Practitioner

## 2022-07-31 ENCOUNTER — Ambulatory Visit
Admission: RE | Admit: 2022-07-31 | Discharge: 2022-07-31 | Disposition: A | Discharge status: Home / Self Care | Payer: Medicaid Other | Source: Ambulatory Visit | Attending: Nurse Practitioner | Admitting: Nurse Practitioner

## 2022-07-31 DIAGNOSIS — M25551 Pain in right hip: Secondary | ICD-10-CM

## 2022-08-28 ENCOUNTER — Encounter: Payer: Self-pay | Admitting: Radiology

## 2022-10-10 ENCOUNTER — Other Ambulatory Visit: Payer: Self-pay

## 2022-10-10 ENCOUNTER — Encounter (HOSPITAL_COMMUNITY): Payer: Self-pay

## 2022-10-10 ENCOUNTER — Inpatient Hospital Stay (HOSPITAL_COMMUNITY): Payer: Medicaid Other | Admitting: Anesthesiology

## 2022-10-10 ENCOUNTER — Emergency Department (HOSPITAL_COMMUNITY): Payer: Medicaid Other

## 2022-10-10 ENCOUNTER — Inpatient Hospital Stay (HOSPITAL_COMMUNITY): Payer: Medicaid Other

## 2022-10-10 ENCOUNTER — Encounter (HOSPITAL_COMMUNITY)
Admission: EM | Disposition: A | Discharge status: Home / Self Care | Payer: Self-pay | Source: Home / Self Care | Attending: Internal Medicine

## 2022-10-10 ENCOUNTER — Inpatient Hospital Stay (HOSPITAL_COMMUNITY)
Admission: EM | Admit: 2022-10-10 | Discharge: 2022-10-16 | DRG: 397 | Disposition: A | Discharge status: Home / Self Care | Payer: Medicaid Other | Attending: Internal Medicine | Admitting: Internal Medicine

## 2022-10-10 DIAGNOSIS — R338 Other retention of urine: Secondary | ICD-10-CM | POA: Diagnosis not present

## 2022-10-10 DIAGNOSIS — J440 Chronic obstructive pulmonary disease with acute lower respiratory infection: Secondary | ICD-10-CM | POA: Diagnosis not present

## 2022-10-10 DIAGNOSIS — I10 Essential (primary) hypertension: Secondary | ICD-10-CM | POA: Diagnosis present

## 2022-10-10 DIAGNOSIS — F419 Anxiety disorder, unspecified: Secondary | ICD-10-CM | POA: Diagnosis present

## 2022-10-10 DIAGNOSIS — E669 Obesity, unspecified: Secondary | ICD-10-CM | POA: Diagnosis present

## 2022-10-10 DIAGNOSIS — F3130 Bipolar disorder, current episode depressed, mild or moderate severity, unspecified: Secondary | ICD-10-CM | POA: Diagnosis present

## 2022-10-10 DIAGNOSIS — K560 Paralytic ileus: Secondary | ICD-10-CM | POA: Diagnosis not present

## 2022-10-10 DIAGNOSIS — E860 Dehydration: Secondary | ICD-10-CM | POA: Diagnosis present

## 2022-10-10 DIAGNOSIS — Z833 Family history of diabetes mellitus: Secondary | ICD-10-CM | POA: Diagnosis not present

## 2022-10-10 DIAGNOSIS — Z800 Family history of malignant neoplasm of digestive organs: Secondary | ICD-10-CM

## 2022-10-10 DIAGNOSIS — I959 Hypotension, unspecified: Secondary | ICD-10-CM | POA: Diagnosis not present

## 2022-10-10 DIAGNOSIS — E7800 Pure hypercholesterolemia, unspecified: Secondary | ICD-10-CM | POA: Diagnosis present

## 2022-10-10 DIAGNOSIS — J449 Chronic obstructive pulmonary disease, unspecified: Secondary | ICD-10-CM | POA: Diagnosis present

## 2022-10-10 DIAGNOSIS — E876 Hypokalemia: Secondary | ICD-10-CM | POA: Diagnosis present

## 2022-10-10 DIAGNOSIS — K353 Acute appendicitis with localized peritonitis, without perforation or gangrene: Secondary | ICD-10-CM

## 2022-10-10 DIAGNOSIS — J189 Pneumonia, unspecified organism: Secondary | ICD-10-CM | POA: Diagnosis not present

## 2022-10-10 DIAGNOSIS — R188 Other ascites: Secondary | ICD-10-CM | POA: Diagnosis not present

## 2022-10-10 DIAGNOSIS — Z853 Personal history of malignant neoplasm of breast: Secondary | ICD-10-CM

## 2022-10-10 DIAGNOSIS — Z83438 Family history of other disorder of lipoprotein metabolism and other lipidemia: Secondary | ICD-10-CM

## 2022-10-10 DIAGNOSIS — E871 Hypo-osmolality and hyponatremia: Secondary | ICD-10-CM | POA: Diagnosis present

## 2022-10-10 DIAGNOSIS — G629 Polyneuropathy, unspecified: Secondary | ICD-10-CM | POA: Diagnosis present

## 2022-10-10 DIAGNOSIS — K9189 Other postprocedural complications and disorders of digestive system: Secondary | ICD-10-CM | POA: Diagnosis not present

## 2022-10-10 DIAGNOSIS — K219 Gastro-esophageal reflux disease without esophagitis: Secondary | ICD-10-CM | POA: Diagnosis present

## 2022-10-10 DIAGNOSIS — Z8249 Family history of ischemic heart disease and other diseases of the circulatory system: Secondary | ICD-10-CM

## 2022-10-10 DIAGNOSIS — Y95 Nosocomial condition: Secondary | ICD-10-CM | POA: Diagnosis not present

## 2022-10-10 DIAGNOSIS — Z880 Allergy status to penicillin: Secondary | ICD-10-CM

## 2022-10-10 DIAGNOSIS — F319 Bipolar disorder, unspecified: Secondary | ICD-10-CM | POA: Diagnosis present

## 2022-10-10 DIAGNOSIS — Z923 Personal history of irradiation: Secondary | ICD-10-CM | POA: Diagnosis not present

## 2022-10-10 DIAGNOSIS — F17210 Nicotine dependence, cigarettes, uncomplicated: Secondary | ICD-10-CM | POA: Diagnosis present

## 2022-10-10 DIAGNOSIS — G2581 Restless legs syndrome: Secondary | ICD-10-CM | POA: Diagnosis present

## 2022-10-10 DIAGNOSIS — E162 Hypoglycemia, unspecified: Secondary | ICD-10-CM | POA: Diagnosis not present

## 2022-10-10 DIAGNOSIS — K3532 Acute appendicitis with perforation and localized peritonitis, without abscess: Secondary | ICD-10-CM | POA: Diagnosis present

## 2022-10-10 DIAGNOSIS — Z6833 Body mass index (BMI) 33.0-33.9, adult: Secondary | ICD-10-CM | POA: Diagnosis not present

## 2022-10-10 DIAGNOSIS — N179 Acute kidney failure, unspecified: Secondary | ICD-10-CM | POA: Diagnosis present

## 2022-10-10 DIAGNOSIS — Z79899 Other long term (current) drug therapy: Secondary | ICD-10-CM

## 2022-10-10 DIAGNOSIS — Z885 Allergy status to narcotic agent status: Secondary | ICD-10-CM

## 2022-10-10 DIAGNOSIS — K3580 Unspecified acute appendicitis: Secondary | ICD-10-CM

## 2022-10-10 DIAGNOSIS — Z7951 Long term (current) use of inhaled steroids: Secondary | ICD-10-CM

## 2022-10-10 DIAGNOSIS — Z825 Family history of asthma and other chronic lower respiratory diseases: Secondary | ICD-10-CM

## 2022-10-10 HISTORY — PX: LAPAROSCOPIC APPENDECTOMY: SHX408

## 2022-10-10 LAB — BASIC METABOLIC PANEL
Anion gap: 16 — ABNORMAL HIGH (ref 5–15)
BUN: 50 mg/dL — ABNORMAL HIGH (ref 6–20)
CO2: 18 mmol/L — ABNORMAL LOW (ref 22–32)
Calcium: 8.5 mg/dL — ABNORMAL LOW (ref 8.9–10.3)
Chloride: 95 mmol/L — ABNORMAL LOW (ref 98–111)
Creatinine, Ser: 3.96 mg/dL — ABNORMAL HIGH (ref 0.44–1.00)
GFR, Estimated: 13 mL/min — ABNORMAL LOW (ref >60)
Glucose, Bld: 92 mg/dL (ref 70–99)
Potassium: 3.2 mmol/L — ABNORMAL LOW (ref 3.5–5.1)
Sodium: 129 mmol/L — ABNORMAL LOW (ref 135–145)

## 2022-10-10 LAB — URINALYSIS, ROUTINE W REFLEX MICROSCOPIC
Bilirubin Urine: NEGATIVE
Glucose, UA: NEGATIVE mg/dL
Ketones, ur: NEGATIVE mg/dL
Leukocytes,Ua: NEGATIVE
Nitrite: NEGATIVE
Protein, ur: 100 mg/dL — AB
Specific Gravity, Urine: 1.019 (ref 1.005–1.030)
pH: 5 (ref 5.0–8.0)

## 2022-10-10 LAB — MAGNESIUM: Magnesium: 1.7 mg/dL (ref 1.7–2.4)

## 2022-10-10 LAB — CBC WITH DIFFERENTIAL/PLATELET
Abs Immature Granulocytes: 0.14 10*3/uL — ABNORMAL HIGH (ref 0.00–0.07)
Basophils Absolute: 0.1 10*3/uL (ref 0.0–0.1)
Basophils Relative: 1 %
Eosinophils Absolute: 0.2 10*3/uL (ref 0.0–0.5)
Eosinophils Relative: 1 %
HCT: 39.9 % (ref 36.0–46.0)
Hemoglobin: 12.9 g/dL (ref 12.0–15.0)
Immature Granulocytes: 1 %
Lymphocytes Relative: 6 %
Lymphs Abs: 0.8 10*3/uL (ref 0.7–4.0)
MCH: 26.7 pg (ref 26.0–34.0)
MCHC: 32.3 g/dL (ref 30.0–36.0)
MCV: 82.6 fL (ref 80.0–100.0)
Monocytes Absolute: 0.4 10*3/uL (ref 0.1–1.0)
Monocytes Relative: 3 %
Neutro Abs: 12.6 10*3/uL — ABNORMAL HIGH (ref 1.7–7.7)
Neutrophils Relative %: 88 %
Platelets: 309 10*3/uL (ref 150–400)
RBC: 4.83 MIL/uL (ref 3.87–5.11)
RDW: 14.1 % (ref 11.5–15.5)
WBC: 14.2 10*3/uL — ABNORMAL HIGH (ref 4.0–10.5)
nRBC: 0 % (ref 0.0–0.2)

## 2022-10-10 SURGERY — APPENDECTOMY, LAPAROSCOPIC
Anesthesia: General | Site: Abdomen

## 2022-10-10 MED ORDER — HEPARIN SODIUM (PORCINE) 5000 UNIT/ML IJ SOLN
5000.0000 [IU] | Freq: Three times a day (TID) | INTRAMUSCULAR | Status: DC
  Administered 2022-10-11 – 2022-10-16 (×15): 5000 [IU] via SUBCUTANEOUS
  Filled 2022-10-10 (×15): qty 1

## 2022-10-10 MED ORDER — OXYCODONE HCL 5 MG PO TABS
5.0000 mg | ORAL_TABLET | ORAL | Status: DC | PRN
  Filled 2022-10-10: qty 1

## 2022-10-10 MED ORDER — BUPIVACAINE HCL (PF) 0.5 % IJ SOLN
INTRAMUSCULAR | Status: AC
  Filled 2022-10-10: qty 30

## 2022-10-10 MED ORDER — ALBUTEROL SULFATE (2.5 MG/3ML) 0.083% IN NEBU: 2.5000 mg | INHALATION_SOLUTION | RESPIRATORY_TRACT | Status: DC | PRN

## 2022-10-10 MED ORDER — FENTANYL CITRATE (PF) 100 MCG/2ML IJ SOLN
INTRAMUSCULAR | Status: DC | PRN
  Administered 2022-10-10: 100 ug via INTRAVENOUS

## 2022-10-10 MED ORDER — CYCLOSPORINE 0.05 % OP EMUL
1.0000 [drp] | Freq: Two times a day (BID) | OPHTHALMIC | Status: DC
  Administered 2022-10-10 – 2022-10-16 (×11): 1 [drp] via OPHTHALMIC
  Filled 2022-10-10 (×9): qty 30

## 2022-10-10 MED ORDER — OXYCODONE HCL 5 MG/5ML PO SOLN: 5.0000 mg | Freq: Once | ORAL | Status: DC | PRN

## 2022-10-10 MED ORDER — PROPOFOL 10 MG/ML IV BOLUS
INTRAVENOUS | Status: AC
  Filled 2022-10-10: qty 20

## 2022-10-10 MED ORDER — PRAMIPEXOLE DIHYDROCHLORIDE 1 MG PO TABS
1.5000 mg | ORAL_TABLET | Freq: Every day | ORAL | Status: DC
  Administered 2022-10-10 – 2022-10-15 (×6): 1.5 mg via ORAL
  Filled 2022-10-10 (×6): qty 2

## 2022-10-10 MED ORDER — ATORVASTATIN CALCIUM 20 MG PO TABS
20.0000 mg | ORAL_TABLET | Freq: Every day | ORAL | Status: DC
  Administered 2022-10-10 – 2022-10-16 (×7): 20 mg via ORAL
  Filled 2022-10-10 (×7): qty 1

## 2022-10-10 MED ORDER — METRONIDAZOLE 500 MG/100ML IV SOLN
500.0000 mg | Freq: Three times a day (TID) | INTRAVENOUS | Status: DC
  Administered 2022-10-10 – 2022-10-12 (×5): 500 mg via INTRAVENOUS
  Filled 2022-10-10 (×4): qty 100

## 2022-10-10 MED ORDER — POTASSIUM CHLORIDE 10 MEQ/100ML IV SOLN
10.0000 meq | INTRAVENOUS | Status: AC
  Administered 2022-10-10 (×4): 10 meq via INTRAVENOUS
  Filled 2022-10-10 (×4): qty 100

## 2022-10-10 MED ORDER — CHLORHEXIDINE GLUCONATE 0.12 % MT SOLN: 15.0000 mL | Freq: Once | OROMUCOSAL | Status: DC

## 2022-10-10 MED ORDER — METRONIDAZOLE 500 MG/100ML IV SOLN
500.0000 mg | Freq: Once | INTRAVENOUS | Status: DC
  Filled 2022-10-10: qty 100

## 2022-10-10 MED ORDER — BUPIVACAINE HCL (PF) 0.5 % IJ SOLN
INTRAMUSCULAR | Status: DC | PRN
  Administered 2022-10-10: 30 mL

## 2022-10-10 MED ORDER — ONDANSETRON HCL 4 MG PO TABS: 4.0000 mg | ORAL_TABLET | Freq: Four times a day (QID) | ORAL | Status: DC | PRN

## 2022-10-10 MED ORDER — MIDAZOLAM HCL 5 MG/5ML IJ SOLN
INTRAMUSCULAR | Status: DC | PRN
  Administered 2022-10-10: 2 mg via INTRAVENOUS

## 2022-10-10 MED ORDER — CIPROFLOXACIN IN D5W 400 MG/200ML IV SOLN
400.0000 mg | Freq: Two times a day (BID) | INTRAVENOUS | Status: DC
  Administered 2022-10-10 – 2022-10-11 (×2): 400 mg via INTRAVENOUS
  Filled 2022-10-10: qty 200

## 2022-10-10 MED ORDER — ACETAMINOPHEN 325 MG PO TABS
650.0000 mg | ORAL_TABLET | Freq: Four times a day (QID) | ORAL | Status: DC | PRN
  Administered 2022-10-11: 650 mg via ORAL
  Filled 2022-10-10: qty 2

## 2022-10-10 MED ORDER — ORAL CARE MOUTH RINSE: 15.0000 mL | Freq: Once | OROMUCOSAL | Status: DC

## 2022-10-10 MED ORDER — FENTANYL CITRATE PF 50 MCG/ML IJ SOSY: 25.0000 ug | PREFILLED_SYRINGE | INTRAMUSCULAR | Status: DC | PRN

## 2022-10-10 MED ORDER — SENNA 8.6 MG PO TABS
1.0000 | ORAL_TABLET | Freq: Two times a day (BID) | ORAL | Status: DC
  Administered 2022-10-10 – 2022-10-14 (×4): 8.6 mg via ORAL
  Filled 2022-10-10 (×10): qty 1

## 2022-10-10 MED ORDER — LACTATED RINGERS IV SOLN: INTRAVENOUS | Status: DC

## 2022-10-10 MED ORDER — MOMETASONE FURO-FORMOTEROL FUM 200-5 MCG/ACT IN AERO
2.0000 | INHALATION_SPRAY | Freq: Two times a day (BID) | RESPIRATORY_TRACT | Status: DC
  Administered 2022-10-10 – 2022-10-16 (×12): 2 via RESPIRATORY_TRACT
  Filled 2022-10-10: qty 8.8

## 2022-10-10 MED ORDER — CIPROFLOXACIN IN D5W 400 MG/200ML IV SOLN
400.0000 mg | Freq: Once | INTRAVENOUS | Status: DC
  Filled 2022-10-10: qty 200

## 2022-10-10 MED ORDER — IPRATROPIUM-ALBUTEROL 0.5-2.5 (3) MG/3ML IN SOLN
3.0000 mL | Freq: Two times a day (BID) | RESPIRATORY_TRACT | Status: DC
  Administered 2022-10-11 – 2022-10-14 (×7): 3 mL via RESPIRATORY_TRACT
  Filled 2022-10-10 (×8): qty 3

## 2022-10-10 MED ORDER — PROPOFOL 10 MG/ML IV BOLUS
INTRAVENOUS | Status: DC | PRN
  Administered 2022-10-10: 150 mg via INTRAVENOUS

## 2022-10-10 MED ORDER — IPRATROPIUM-ALBUTEROL 0.5-2.5 (3) MG/3ML IN SOLN
3.0000 mL | Freq: Four times a day (QID) | RESPIRATORY_TRACT | Status: DC
  Administered 2022-10-10: 3 mL via RESPIRATORY_TRACT
  Filled 2022-10-10: qty 3

## 2022-10-10 MED ORDER — CROMOLYN SODIUM 4 % OP SOLN
1.0000 [drp] | Freq: Two times a day (BID) | OPHTHALMIC | Status: DC
  Filled 2022-10-10: qty 10

## 2022-10-10 MED ORDER — SUCCINYLCHOLINE CHLORIDE 20 MG/ML IJ SOLN
INTRAMUSCULAR | Status: DC | PRN
  Administered 2022-10-10: 100 mg via INTRAVENOUS

## 2022-10-10 MED ORDER — HYDRALAZINE HCL 20 MG/ML IJ SOLN: 5.0000 mg | Freq: Four times a day (QID) | INTRAMUSCULAR | Status: DC | PRN

## 2022-10-10 MED ORDER — FENOFIBRATE 54 MG PO TABS
54.0000 mg | ORAL_TABLET | Freq: Every day | ORAL | Status: DC
  Administered 2022-10-10 – 2022-10-16 (×7): 54 mg via ORAL
  Filled 2022-10-10 (×8): qty 1

## 2022-10-10 MED ORDER — ONDANSETRON HCL 4 MG/2ML IJ SOLN
INTRAMUSCULAR | Status: DC | PRN
  Administered 2022-10-10: 4 mg via INTRAVENOUS

## 2022-10-10 MED ORDER — SUGAMMADEX SODIUM 200 MG/2ML IV SOLN
INTRAVENOUS | Status: DC | PRN
  Administered 2022-10-10: 200 mg via INTRAVENOUS

## 2022-10-10 MED ORDER — TAMOXIFEN CITRATE 10 MG PO TABS
10.0000 mg | ORAL_TABLET | Freq: Every evening | ORAL | Status: DC
  Administered 2022-10-10 – 2022-10-15 (×6): 10 mg via ORAL
  Filled 2022-10-10 (×6): qty 1

## 2022-10-10 MED ORDER — ROCURONIUM BROMIDE 100 MG/10ML IV SOLN
INTRAVENOUS | Status: DC | PRN
  Administered 2022-10-10: 50 mg via INTRAVENOUS

## 2022-10-10 MED ORDER — PANTOPRAZOLE SODIUM 40 MG PO TBEC
40.0000 mg | DELAYED_RELEASE_TABLET | Freq: Every day | ORAL | Status: DC
  Administered 2022-10-10 – 2022-10-16 (×7): 40 mg via ORAL
  Filled 2022-10-10 (×7): qty 1

## 2022-10-10 MED ORDER — MIDAZOLAM HCL 2 MG/2ML IJ SOLN
INTRAMUSCULAR | Status: AC
  Filled 2022-10-10: qty 2

## 2022-10-10 MED ORDER — ALBUTEROL SULFATE (2.5 MG/3ML) 0.083% IN NEBU: 2.5000 mg | INHALATION_SOLUTION | Freq: Four times a day (QID) | RESPIRATORY_TRACT | Status: DC

## 2022-10-10 MED ORDER — ONDANSETRON HCL 4 MG/2ML IJ SOLN
4.0000 mg | Freq: Four times a day (QID) | INTRAMUSCULAR | Status: DC | PRN
  Administered 2022-10-11 – 2022-10-14 (×2): 4 mg via INTRAVENOUS
  Filled 2022-10-10 (×3): qty 2

## 2022-10-10 MED ORDER — MONTELUKAST SODIUM 10 MG PO TABS
10.0000 mg | ORAL_TABLET | Freq: Every day | ORAL | Status: DC
  Administered 2022-10-10 – 2022-10-16 (×7): 10 mg via ORAL
  Filled 2022-10-10 (×7): qty 1

## 2022-10-10 MED ORDER — FLEET ENEMA 7-19 GM/118ML RE ENEM: 1.0000 | ENEMA | Freq: Once | RECTAL | Status: DC | PRN

## 2022-10-10 MED ORDER — IPRATROPIUM BROMIDE 0.02 % IN SOLN: 0.5000 mg | Freq: Four times a day (QID) | RESPIRATORY_TRACT | Status: DC

## 2022-10-10 MED ORDER — ONDANSETRON HCL 4 MG/2ML IJ SOLN: 4.0000 mg | Freq: Once | INTRAMUSCULAR | Status: DC | PRN

## 2022-10-10 MED ORDER — SODIUM CHLORIDE 0.9 % IV SOLN: INTRAVENOUS | Status: DC | PRN

## 2022-10-10 MED ORDER — SODIUM CHLORIDE 0.9 % IV SOLN: INTRAVENOUS | Status: DC

## 2022-10-10 MED ORDER — SODIUM CHLORIDE 0.9 % IV BOLUS
1000.0000 mL | Freq: Once | INTRAVENOUS | Status: AC
  Administered 2022-10-10: 1000 mL via INTRAVENOUS

## 2022-10-10 MED ORDER — SODIUM CHLORIDE 0.9 % IV BOLUS: 1000.0000 mL | Freq: Once | INTRAVENOUS | Status: DC

## 2022-10-10 MED ORDER — FENTANYL CITRATE (PF) 100 MCG/2ML IJ SOLN
INTRAMUSCULAR | Status: AC
  Filled 2022-10-10: qty 2

## 2022-10-10 MED ORDER — HYDROMORPHONE HCL 1 MG/ML IJ SOLN
0.5000 mg | INTRAMUSCULAR | Status: DC | PRN
  Administered 2022-10-11 – 2022-10-12 (×2): 0.5 mg via INTRAVENOUS
  Administered 2022-10-12 – 2022-10-13 (×3): 1 mg via INTRAVENOUS
  Filled 2022-10-10: qty 0.5
  Filled 2022-10-10 (×4): qty 1

## 2022-10-10 MED ORDER — SODIUM CHLORIDE 0.9 % IR SOLN
Status: DC | PRN
  Administered 2022-10-10: 1000 mL
  Administered 2022-10-10: 3000 mL

## 2022-10-10 MED ORDER — OXYCODONE HCL 5 MG PO TABS: 5.0000 mg | ORAL_TABLET | Freq: Once | ORAL | Status: DC | PRN

## 2022-10-10 MED ORDER — ACETAMINOPHEN 650 MG RE SUPP: 650.0000 mg | Freq: Four times a day (QID) | RECTAL | Status: DC | PRN

## 2022-10-10 SURGICAL SUPPLY — 51 items
BLADE SURG 15 STRL LF DISP TIS (BLADE) ×1 IMPLANT
BLADE SURG 15 STRL SS (BLADE) ×1
CHLORAPREP W/TINT 26 (MISCELLANEOUS) ×1 IMPLANT
CLOTH BEACON ORANGE TIMEOUT ST (SAFETY) ×1 IMPLANT
COVER LIGHT HANDLE STERIS (MISCELLANEOUS) ×2 IMPLANT
CUTTER FLEX LINEAR 45M (STAPLE) ×1 IMPLANT
DERMABOND ADVANCED .7 DNX12 (GAUZE/BANDAGES/DRESSINGS) ×1 IMPLANT
DRSG TEGADERM 2-3/8X2-3/4 SM (GAUZE/BANDAGES/DRESSINGS) IMPLANT
ELECT REM PT RETURN 9FT ADLT (ELECTROSURGICAL) ×1
ELECTRODE REM PT RTRN 9FT ADLT (ELECTROSURGICAL) ×1 IMPLANT
EVACUATOR DRAINAGE 10X20 100CC (DRAIN) IMPLANT
EVACUATOR SILICONE 100CC (DRAIN) ×1
GAUZE SPONGE 2X2 STRL 8-PLY (GAUZE/BANDAGES/DRESSINGS) IMPLANT
GLOVE BIO SURGEON STRL SZ 6.5 (GLOVE) ×1 IMPLANT
GLOVE BIOGEL PI IND STRL 6.5 (GLOVE) ×1 IMPLANT
GLOVE BIOGEL PI IND STRL 7.0 (GLOVE) ×2 IMPLANT
GLOVE SS BIOGEL STRL SZ 6.5 (GLOVE) IMPLANT
GLOVE SURG SS PI 6.5 STRL IVOR (GLOVE) IMPLANT
GOWN STRL REUS W/TWL LRG LVL3 (GOWN DISPOSABLE) ×2 IMPLANT
INST SET LAPROSCOPIC AP (KITS) ×1 IMPLANT
KIT TURNOVER KIT A (KITS) ×1 IMPLANT
MANIFOLD NEPTUNE II (INSTRUMENTS) ×1 IMPLANT
NDL HYPO 21X1.5 SAFETY (NEEDLE) ×1 IMPLANT
NDL INSUFFLATION 14GA 120MM (NEEDLE) ×1 IMPLANT
NEEDLE HYPO 21X1.5 SAFETY (NEEDLE) ×1 IMPLANT
NEEDLE INSUFFLATION 14GA 120MM (NEEDLE) ×1 IMPLANT
NS IRRIG 1000ML POUR BTL (IV SOLUTION) ×1 IMPLANT
PACK LAP CHOLE LZT030E (CUSTOM PROCEDURE TRAY) ×1 IMPLANT
PAD ARMBOARD 7.5X6 YLW CONV (MISCELLANEOUS) ×1 IMPLANT
RELOAD STAPLE 45 3.5 BLU ETS (ENDOMECHANICALS) IMPLANT
RELOAD STAPLE TA45 3.5 REG BLU (ENDOMECHANICALS) ×1 IMPLANT
SET BASIN LINEN APH (SET/KITS/TRAYS/PACK) ×1 IMPLANT
SET TUBE IRRIG SUCTION NO TIP (IRRIGATION / IRRIGATOR) IMPLANT
SET TUBE SMOKE EVAC HIGH FLOW (TUBING) ×1 IMPLANT
SHEARS HARMONIC ACE PLUS 36CM (ENDOMECHANICALS) ×1 IMPLANT
SPONGE DRAIN TRACH 4X4 STRL 2S (GAUZE/BANDAGES/DRESSINGS) IMPLANT
STAPLER VISISTAT (STAPLE) IMPLANT
SUT ETHILON 3 0 FSL (SUTURE) IMPLANT
SUT MNCRL AB 4-0 PS2 18 (SUTURE) ×2 IMPLANT
SUT VICRYL 0 UR6 27IN ABS (SUTURE) ×1 IMPLANT
SYR 30ML LL (SYRINGE) ×3 IMPLANT
SYS BAG RETRIEVAL 10MM (BASKET) ×1
SYSTEM BAG RETRIEVAL 10MM (BASKET) ×1 IMPLANT
TAPE HYPAFIX 6X30 (GAUZE/BANDAGES/DRESSINGS) IMPLANT
TRAY FOLEY W/BAG SLVR 16FR (SET/KITS/TRAYS/PACK) ×1
TRAY FOLEY W/BAG SLVR 16FR ST (SET/KITS/TRAYS/PACK) ×1 IMPLANT
TROCAR Z THRD FIOS 12X150 (TROCAR) IMPLANT
TROCAR Z-THAD FIOS HNDL 12X100 (TROCAR) ×1 IMPLANT
TROCAR Z-THRD FIOS HNDL 11X100 (TROCAR) ×1 IMPLANT
TROCAR Z-THREAD FIOS 5X100MM (TROCAR) ×1 IMPLANT
TUBE CONNECTING 12X1/4 (SUCTIONS) ×1 IMPLANT

## 2022-10-10 NOTE — ED Notes (Signed)
Anestologist here to see and receive pt.  He will hang the antibotics.  Gave him the antibotics to hang   Pt signed consent with verbal understanding.

## 2022-10-10 NOTE — H&P (Signed)
TRH H&P   Patient Demographics:    Liddy Deam, is a 57 y.o. female  MRN: [id number]   DOB - 20-Nov-1965  Admit Date - 10/10/2022  Outpatient Primary MD for the patient is Remus Loffler, PA-C  Referring MD/NP/PA: Dr Particia Nearing  Patient coming from: home  Chief Complaint  Patient presents with   Urinary Retention      HPI:    Viveca Beckstrom  is a 57 y.o. female, with past medical history of hypertension, COPD, hyperlipidemia, GERD, depression, anxiety, bipolar disorder, history of breast cancer, patient presents to ED secondary to complaints of nausea, vomiting and abdominal pain for last 4 days, as well she does report difficulty urinating over the last 2 days, able to tolerate any oral intake. -In ED workup significant for AKI with a creatinine of 3.6, white blood cell count of 14, sodium of 129, and potassium of 2.2, CT abdomen was obtained, which was significant for acute appendicitis, general surgery has been consulted, and recommended admission to hospitalist service given AKI and significant electrolyte derangement.    Review of systems:   A full 10 point Review of Systems was done, except as stated above, all other Review of Systems were negative.   With Past History of the following :    Past Medical History:  Diagnosis Date   Anxiety    Bipolar disorder    Breast cancer    radiation   Bronchitis, chronic    COPD (chronic obstructive pulmonary disease)    DDD (degenerative disc disease), cervical    DDD (degenerative disc disease), lumbar    Degenerative disorder of bone    OF BACK   Depression    GERD (gastroesophageal reflux disease)    Headache(784.0)    History of migraines,  takes propanolol for headaches   High cholesterol    Hypertension    Neuropathy    h/o   Personal history of radiation therapy    RLS (restless legs syndrome)    Tobacco  abuse    Tremor       Past Surgical History:  Procedure Laterality Date   ABDOMINAL HYSTERECTOMY     ANKLE SURGERY Left    left ankle surgery as teenager per pt   BREAST LUMPECTOMY Left 03/15/2019   BREAST LUMPECTOMY WITH RADIOACTIVE SEED AND SENTINEL LYMPH NODE BIOPSY Left 03/15/2019   Procedure: LEFT BREAST LUMPECTOMY WITH RADIOACTIVE SEED AND AXILLARY SENTINEL LYMPH NODE BIOPSY;  Surgeon: Ovidio Kin, MD;  Location: [facility] OR;  Service: General;  Laterality: Left;   CHOLECYSTECTOMY  03/13/2011   Procedure: LAPAROSCOPIC CHOLECYSTECTOMY;  Surgeon: Fabio Bering;  Location: [facility];  Service: General;  Laterality: N/A;      Social History:     Social History   Tobacco Use   Smoking status: Every Day    Packs/day: 1.00    Years: 39.00    Additional  pack years: 0.00    Total pack years: 39.00    Types: Cigarettes   Smokeless tobacco: Never  Substance Use Topics   Alcohol use: No      Family History :     Family History  Problem Relation Age of Onset   Hypertension Mother    Diabetes Mother    Asthma Mother    Hypertension Father    COPD Father    Heart attack Father    Hyperlipidemia Brother    Stomach cancer Maternal Uncle    Colon polyps Neg Hx    Colon cancer Neg Hx    Esophageal cancer Neg Hx       Home Medications:   Prior to Admission medications   Medication Sig Start Date End Date Taking? Authorizing Provider  acetaminophen (TYLENOL) 500 MG tablet Take 1,000 mg by mouth 2 (two) times daily as needed for moderate pain or headache.   Yes [provider]  albuterol (VENTOLIN HFA) 108 (90 Base) MCG/ACT inhaler Inhale 2 puffs into the lungs every 4 (four) hours as needed for wheezing or shortness of breath. 03/30/20  Yes [provider]  atorvastatin (LIPITOR) 20 MG tablet Take 1 tablet (20 mg total) by mouth daily. 01/24/19  Yes Jacquelin Hawking, PA-C  cromolyn (OPTICROM) 4 % ophthalmic solution 1 drop 2 (two) times daily.   Yes [provider]  cyclobenzaprine (FLEXERIL) 10 MG tablet Take 10 mg by mouth daily. 12/10/20  Yes [provider]  fenofibrate (TRICOR) 145 MG tablet Take 145 mg by mouth daily. 12/11/20  Yes [provider]  fluticasone (FLONASE) 50 MCG/ACT nasal spray Place 1 spray into both nostrils 2 (two) times daily.   Yes [provider]  gabapentin (NEURONTIN) 300 MG capsule Take 300 mg by mouth 3 (three) times daily. 06/04/21  Yes [provider]  hydrOXYzine (VISTARIL) 25 MG capsule Take 25 mg by mouth 2 (two) times daily as needed.   Yes [provider]  ibuprofen (ADVIL) 800 MG tablet Take 1 tablet (800 mg total) by mouth every 6 (six) hours as needed. 04/22/21  Yes Vickki Hearing, MD  loratadine (CLARITIN) 10 MG tablet Take 10 mg by mouth daily as needed for allergies.    Yes [provider]  montelukast (SINGULAIR) 10 MG tablet Take 10 mg by mouth daily. 06/06/21  Yes [provider]  omeprazole (PRILOSEC) 40 MG capsule Take 1 capsule by mouth every morning. 10/11/20  Yes [provider]  pramipexole (MIRAPEX) 1.5 MG tablet Take 1 tablet (1.5 mg total) by mouth at bedtime. 03/11/22  Yes Serena Croissant, MD  propranolol (INDERAL) 20 MG tablet Take 20 mg by mouth 2 (two) times daily. For tremors   Yes [provider]  RESTASIS 0.05 % ophthalmic emulsion Place 1 drop into both eyes 2 (two) times daily.   Yes [provider]  risperiDONE (RISPERDAL) 0.5 MG tablet Take 0.5 mg by mouth 2 (two) times daily.   Yes [provider]  spironolactone (ALDACTONE) 50 MG tablet Take 50 mg by mouth daily.   Yes [provider]  SYMBICORT 160-4.5 MCG/ACT inhaler Inhale into the lungs. 05/14/21  Yes [provider]  tamoxifen (NOLVADEX) 10 MG tablet Take 1 tablet by mouth once daily Patient taking differently: Take 10 mg by mouth every evening. 03/11/22  Yes Serena Croissant, MD  venlafaxine XR (EFFEXOR-XR) 150  MG 24 hr capsule Take 150 mg by mouth every evening.   Yes [provider]  potassium chloride SA (KLOR-CON M) 20 MEQ tablet Take 20 mEq by mouth daily. Patient not taking: Reported on 10/10/2022 09/18/22   [provider]     Allergies:     Allergies  Allergen Reactions   Codeine Hives and Nausea And Vomiting   Morphine And Related Nausea Only   Penicillins Hives    Did it involve swelling of the face/tongue/throat, SOB, or low BP? No Did it involve sudden or severe rash/hives, skin peeling, or any reaction on the inside of your mouth or nose? No Did you need to seek medical attention at a hospital or doctor's office? No When did it last happen?      10+ years If all above answers are "NO", may proceed with cephalosporin use.      Physical Exam:   Vitals  Blood pressure 100/60, pulse 78, temperature 97.7 F (36.5 C), temperature source Oral, resp. rate 18, height  (1.6 m), weight 84.8 kg, SpO2 98 %.   1. General well developed  female, laying in bed, in no apparent distress  2. Normal affect and insight, Not Suicidal or Homicidal, Awake Alert, Oriented X 3.  3. No F.N deficits, ALL C.Nerves Intact, Strength 5/5 all 4 extremities, Sensation intact all 4 extremities, Plantars down going.  4. Ears and Eyes appear Normal, Conjunctivae clear, PERRLA. Moist Oral Mucosa.  5. Supple Neck, No JVD, No cervical lymphadenopathy appriciated, No Carotid Bruits.  6. Symmetrical Chest wall movement, Good air movement bilaterally, CTAB.  7. RRR, No Gallops, Rubs or Murmurs, No Parasternal Heave.  8. Positive Bowel Sounds, tender to palpation in lower abdomen, with guarding   9.  No Cyanosis, slow Skin Turgor, No Skin Rash or Bruise.  10. Good muscle tone,  joints appear normal , no effusions, Normal ROM.     Data Review:    CBC Recent Labs  Lab 10/10/22 1252  WBC 14.2*  HGB 12.9  HCT 39.9  PLT 309  MCV 82.6  MCH 26.7  MCHC 32.3  RDW 14.1   LYMPHSABS 0.8  MONOABS 0.4  EOSABS 0.2  BASOSABS 0.1   ------------------------------------------------------------------------------------------------------------------  Chemistries  Recent Labs  Lab 10/10/22 1252 10/10/22 1306  NA 129*  --   K 3.2*  --   CL 95*  --   CO2 18*  --   GLUCOSE 92  --   BUN 50*  --   CREATININE 3.96*  --   CALCIUM 8.5*  --   MG  --  1.7   ------------------------------------------------------------------------------------------------------------------ estimated creatinine clearance is 16.8 mL/min (A) (by C-G formula based on SCr of 3.96 mg/dL (H)). ------------------------------------------------------------------------------------------------------------------ No results for input(s): "TSH", "T4TOTAL", "T3FREE", "THYROIDAB" in the last 72 hours.  Invalid input(s): "FREET3"  Coagulation profile No results for input(s): "INR", "PROTIME" in the last 168 hours. ------------------------------------------------------------------------------------------------------------------- No results for input(s): "DDIMER" in the last 72 hours. -------------------------------------------------------------------------------------------------------------------  Cardiac Enzymes No results for input(s): "CKMB", "TROPONINI", "MYOGLOBIN" in the last 168 hours.  Invalid input(s): "CK" ------------------------------------------------------------------------------------------------------------------ No results found for: "BNP"   ---------------------------------------------------------------------------------------------------------------  Urinalysis    Component Value Date/Time   COLORURINE AMBER (A) 10/10/2022 1432   APPEARANCEUR CLOUDY (A) 10/10/2022 1432   LABSPEC 1.019 10/10/2022 1432   PHURINE 5.0 10/10/2022 1432   GLUCOSEU NEGATIVE 10/10/2022 1432   HGBUR SMALL (A) 10/10/2022 1432   BILIRUBINUR NEGATIVE 10/10/2022 1432   KETONESUR NEGATIVE 10/10/2022  1432   PROTEINUR 100 (A) 10/10/2022 1432   UROBILINOGEN 0.2 05/27/2014 0352  NITRITE NEGATIVE 10/10/2022 1432   LEUKOCYTESUR NEGATIVE 10/10/2022 1432    ----------------------------------------------------------------------------------------------------------------   Imaging Results:    CT Renal Stone Study  Result Date: 10/10/2022 CLINICAL DATA:  Right lower quadrant/flank pain concerning for kidney stone EXAM: CT ABDOMEN AND PELVIS WITHOUT CONTRAST TECHNIQUE: Multidetector CT imaging of the abdomen and pelvis was performed following the standard protocol without IV contrast. RADIATION DOSE REDUCTION: This exam was performed according to the departmental dose-optimization program which includes automated exposure control, adjustment of the mA and/or kV according to patient size and/or use of iterative reconstruction technique. COMPARISON:  Remote prior CT scan of the abdomen and pelvis 05/28/2014 FINDINGS: Lower chest: Linear atelectasis versus scarring in the lung bases bilaterally. No acute abnormality. Atherosclerotic calcifications throughout the coronary arteries. Normal size heart. No pleural effusion. Hepatobiliary: No focal liver abnormality is seen. Status post cholecystectomy. No biliary dilatation. Pancreas: Unremarkable. No pancreatic ductal dilatation or surrounding inflammatory changes. Spleen: Normal in size without focal abnormality. Adrenals/Urinary Tract: Adrenal glands are unremarkable. Kidneys are normal, without renal calculi, focal lesion, or hydronephrosis. Bladder is unremarkable. Stomach/Bowel: Diffuse mild distension of small bowel without definitive transition point suggestive of ileus. A dilated tubular structure is present extending from the cecum over the right pelvic sidewall measuring up to 1.2 cm in diameter. Mild stranding in the adjacent fat. Findings are suggestive of acute appendicitis. No evidence of rupture or abscess given limitations of non contrasted imaging.  Vascular/Lymphatic: Limited evaluation in the absence of intravenous contrast. Atherosclerotic calcifications present throughout the aorta. No aneurysm. Reproductive: Status post hysterectomy. No adnexal masses. Other: No abdominal wall hernia or abnormality. No abdominopelvic ascites. Musculoskeletal: Focal degenerative changes at L2-L3. Gas within the disc space suggests the absence of active discitis. IMPRESSION: 1. CT findings are most consistent with acute suppurative appendicitis although evaluation is limited in the absence of intravenous contrast. No evidence of free air or fluid collection to suggest perforation. 2. Secondary reactive small-bowel ileus. 3.  Aortic Atherosclerosis (ICD10-I70.0). 4. No evidence of nephrolithiasis or hydronephrosis. 5. Focal degenerative disc disease at L2-L3. 6. Bibasilar pleuroparenchymal pulmonary scarring. Electronically Signed   By: Malachy Moan M.D.   On: 10/10/2022 15:39    EKG:  PR interval 148 ms QRS duration 80 ms QT/QTcB 356/430 ms P-R-T axes 58 72 50 Normal sinus rhythm Normal ECG   Assessment & Plan:    Principal Problem:   Acute appendicitis Active Problems:   Hypokalemia   Chronic obstructive pulmonary disease, unspecified   Hyponatremia   AKI (acute kidney injury)    Acute suppurative appendicitis -Per general surgery, plan for OR today, meanwhile continue with IV ofloxacin and Flagyl.  AKI -Due to volume depletion and dehydration, from nausea, vomiting and poor oral intake -Check urine sodium -Continue with IV fluids -Avoid nephrotoxic medications -Avoid low blood pressure -No evidence of urinary retention  Hypertension -Blood pressure is soft, as well due to low blood pressure will hold home medications including Aldactone and propranolol, meanwhile we will keep on current hydralazine  COPD - Continue with home medications, add as needed butyryl  Hyponatremia - Due to volume depletion, continue with IV  fluids  Hypokalemia -Repleted, recheck in a.m.  History of breast cancer -Continue with tamoxifen   GERD - Continue with PPI   Bipolar disorder/depression - Resume home medication when stable   DVT Prophylaxis Heparin   AM Labs Ordered, also please review Full Orders  Family Communication: Admission, patients condition and plan of care including tests being ordered  have been discussed with the patient who indicate understanding and agree with the plan and Code Status.  Code Status Full  Likely DC to  home  Condition GUARDED    Consults called: general Surgery    Admission status:   inpatient  Time spent in minutes : 70 minutes   Huey Bienenstock M.D on 10/10/2022 at 4:41 PM   [facility] - Office  [phone]

## 2022-10-10 NOTE — Consult Note (Signed)
[facility] Consult  Reason for Consult: Acute appendicitis  Referring Physician: Dr. Particia Nearing   Chief Complaint   Urinary Retention     HPI: Elizabeth Bullock is a 57 y.o. female with acute appendicitis on CT scan. She has been having abdominal pain for over 3 years and reports having nausea and vomiting. She has not tolerated any po intake and has been having trouble urinating. She was found to be dehydrated and has and AKI. She has appendicitis. She denies any chest pain or SOB.  Past Medical History:  Diagnosis Date   Anxiety    Bipolar disorder    Breast cancer    radiation   Bronchitis, chronic    COPD (chronic obstructive pulmonary disease)    DDD (degenerative disc disease), cervical    DDD (degenerative disc disease), lumbar    Degenerative disorder of bone    OF BACK   Depression    GERD (gastroesophageal reflux disease)    Headache(784.0)    History of migraines,  takes propanolol for headaches   High cholesterol    Hypertension    Neuropathy    h/o   Personal history of radiation therapy    RLS (restless legs syndrome)    Tobacco abuse    Tremor     Past Surgical History:  Procedure Laterality Date   ABDOMINAL HYSTERECTOMY     ANKLE SURGERY Left    left ankle surgery as teenager per pt   BREAST LUMPECTOMY Left 03/15/2019   BREAST LUMPECTOMY WITH RADIOACTIVE SEED AND SENTINEL LYMPH NODE BIOPSY Left 03/15/2019   Procedure: LEFT BREAST LUMPECTOMY WITH RADIOACTIVE SEED AND AXILLARY SENTINEL LYMPH NODE BIOPSY;  Surgeon: Ovidio Kin, MD;  Location: [facility] OR;  Service: General;  Laterality: Left;   CHOLECYSTECTOMY  03/13/2011   Procedure: LAPAROSCOPIC CHOLECYSTECTOMY;  Surgeon: Fabio Bering;  Location: [facility];  Service: General;  Laterality: N/A;    Family History  Problem Relation Age of Onset   Hypertension Mother    Diabetes Mother    Asthma Mother    Hypertension Father    COPD Father    Heart attack Father    Hyperlipidemia Brother     Stomach cancer Maternal Uncle    Colon polyps Neg Hx    Colon cancer Neg Hx    Esophageal cancer Neg Hx     Social History   Tobacco Use   Smoking status: Every Day    Packs/day: 1.00    Years: 39.00    Additional pack years: 0.00    Total pack years: 39.00    Types: Cigarettes   Smokeless tobacco: Never  Vaping Use   Vaping Use: Never used  Substance Use Topics   Alcohol use: No   Drug use: Yes    Types: Marijuana    Comment: uses daily    Medications: I have reviewed the patient's current medications.  Current Facility-Administered Medications:    ciprofloxacin (CIPRO) IVPB 400 mg, 400 mg, Intravenous, Once **AND** metroNIDAZOLE (FLAGYL) IVPB 500 mg, 500 mg, Intravenous, Once, Jacalyn Lefevre, MD   sodium chloride 0.9 % bolus 1,000 mL, 1,000 mL, Intravenous, Once, Jacalyn Lefevre, MD  Current Outpatient Medications:    acetaminophen (TYLENOL) 500 MG tablet, Take 1,000 mg by mouth 2 (two) times daily as needed for moderate pain or headache., Disp: , Rfl:    albuterol (VENTOLIN HFA) 108 (90 Base) MCG/ACT inhaler, Inhale 2 puffs into the lungs every 4 (four) hours as needed for wheezing or shortness of breath.,  Disp: , Rfl:    atorvastatin (LIPITOR) 20 MG tablet, Take 1 tablet (20 mg total) by mouth daily., Disp: 90 tablet, Rfl: 1   cromolyn (OPTICROM) 4 % ophthalmic solution, 1 drop 2 (two) times daily., Disp: , Rfl:    cyclobenzaprine (FLEXERIL) 10 MG tablet, Take 10 mg by mouth daily., Disp: , Rfl:    fenofibrate (TRICOR) 145 MG tablet, Take 145 mg by mouth daily., Disp: , Rfl:    fluticasone (FLONASE) 50 MCG/ACT nasal spray, Place 1 spray into both nostrils 2 (two) times daily., Disp: , Rfl:    gabapentin (NEURONTIN) 300 MG capsule, Take 300 mg by mouth 3 (three) times daily., Disp: , Rfl:    hydrOXYzine (VISTARIL) 25 MG capsule, Take 25 mg by mouth 2 (two) times daily as needed., Disp: , Rfl:    ibuprofen (ADVIL) 800 MG tablet, Take 1 tablet (800 mg total) by mouth  every 6 (six) hours as needed., Disp: 90 tablet, Rfl: 2   loratadine (CLARITIN) 10 MG tablet, Take 10 mg by mouth daily as needed for allergies. , Disp: , Rfl:    montelukast (SINGULAIR) 10 MG tablet, Take 10 mg by mouth daily., Disp: , Rfl:    omeprazole (PRILOSEC) 40 MG capsule, Take 1 capsule by mouth every morning., Disp: , Rfl:    pramipexole (MIRAPEX) 1.5 MG tablet, Take 1 tablet (1.5 mg total) by mouth at bedtime., Disp: , Rfl:    propranolol (INDERAL) 20 MG tablet, Take 20 mg by mouth 2 (two) times daily. For tremors, Disp: , Rfl:    RESTASIS 0.05 % ophthalmic emulsion, Place 1 drop into both eyes 2 (two) times daily., Disp: , Rfl:    risperiDONE (RISPERDAL) 0.5 MG tablet, Take 0.5 mg by mouth 2 (two) times daily., Disp: , Rfl:    spironolactone (ALDACTONE) 50 MG tablet, Take 50 mg by mouth daily., Disp: , Rfl:    SYMBICORT 160-4.5 MCG/ACT inhaler, Inhale into the lungs., Disp: , Rfl:    tamoxifen (NOLVADEX) 10 MG tablet, Take 1 tablet by mouth once daily (Patient taking differently: Take 10 mg by mouth every evening.), Disp: 90 tablet, Rfl: 3   venlafaxine XR (EFFEXOR-XR) 150 MG 24 hr capsule, Take 150 mg by mouth every evening., Disp: , Rfl:    potassium chloride SA (KLOR-CON M) 20 MEQ tablet, Take 20 mEq by mouth daily. (Patient not taking: Reported on 10/10/2022), Disp: , Rfl:   Allergies  Allergen Reactions   Codeine Hives and Nausea And Vomiting   Morphine And Related Nausea Only   Penicillins Hives    Did it involve swelling of the face/tongue/throat, SOB, or low BP? No Did it involve sudden or severe rash/hives, skin peeling, or any reaction on the inside of your mouth or nose? No Did you need to seek medical attention at a hospital or doctor's office? No When did it last happen?      10+ years If all above answers are "NO", may proceed with cephalosporin use.      ROS:  A comprehensive review of systems was negative except for: Gastrointestinal: positive for abdominal  pain, diarrhea, nausea, and vomiting Genitourinary: positive for retention, decreased output   Blood pressure (!) 88/64, pulse 77, temperature 97.7 F (36.5 C), temperature source Oral, resp. rate 18, height  (1.6 m), weight 84.8 kg, SpO2 98 %. BP 101/66 on repeat  Physical Exam Vitals reviewed.  HENT:     Head: Normocephalic.     Nose: Nose normal.  Eyes:     Extraocular Movements: Extraocular movements intact.  Cardiovascular:     Rate and Rhythm: Normal rate.  Pulmonary:     Effort: Pulmonary effort is normal.  Abdominal:     General: There is no distension.     Palpations: Abdomen is soft.     Tenderness: There is abdominal tenderness.  Musculoskeletal:        General: Normal range of motion.  Skin:    General: Skin is warm.  Neurological:     General: No focal deficit present.     Mental Status: She is alert and oriented to person, place, and time.  Psychiatric:        Mood and Affect: Mood normal.        Behavior: Behavior normal.     Results: Results for orders placed or performed during the hospital encounter of 10/10/22 (from the past 48 hour(s))  CBC with Differential     Status: Abnormal   Collection Time: 10/10/22 12:52 PM  Result Value Ref Range   WBC 14.2 (H) 4.0 - 10.5 K/uL   RBC 4.83 3.87 - 5.11 MIL/uL   Hemoglobin 12.9 12.0 - 15.0 g/dL   HCT 16.1 09.6 - 04.5 %   MCV 82.6 80.0 - 100.0 fL   MCH 26.7 26.0 - 34.0 pg   MCHC 32.3 30.0 - 36.0 g/dL   RDW 40.9 81.1 - 91.4 %   Platelets 309 150 - 400 K/uL   nRBC 0.0 0.0 - 0.2 %   Neutrophils Relative % 88 %   Neutro Abs 12.6 (H) 1.7 - 7.7 K/uL   Lymphocytes Relative 6 %   Lymphs Abs 0.8 0.7 - 4.0 K/uL   Monocytes Relative 3 %   Monocytes Absolute 0.4 0.1 - 1.0 K/uL   Eosinophils Relative 1 %   Eosinophils Absolute 0.2 0.0 - 0.5 K/uL   Basophils Relative 1 %   Basophils Absolute 0.1 0.0 - 0.1 K/uL   Immature Granulocytes 1 %   Abs Immature Granulocytes 0.14 (H) 0.00 - 0.07 K/uL    Comment:  Performed at [facility], [address] [place], [place] [address]  Basic metabolic panel     Status: Abnormal   Collection Time: 10/10/22 12:52 PM  Result Value Ref Range   Sodium 129 (L) 135 - 145 mmol/L   Potassium 3.2 (L) 3.5 - 5.1 mmol/L   Chloride 95 (L) 98 - 111 mmol/L   CO2 18 (L) 22 - 32 mmol/L   Glucose, Bld 92 70 - 99 mg/dL    Comment: Glucose reference range applies only to samples taken after fasting for at least 8 hours.   BUN 50 (H) 6 - 20 mg/dL   Creatinine, Ser 6.21 (H) 0.44 - 1.00 mg/dL   Calcium 8.5 (L) 8.9 - 10.3 mg/dL   GFR, Estimated 13 (L) >60 mL/min    Comment: (NOTE) Calculated using the CKD-EPI Creatinine Equation (2021)    Anion gap 16 (H) 5 - 15    Comment: Performed at [facility], [address], [place], [place] [address]  Magnesium     Status: None   Collection Time: 10/10/22  1:06 PM  Result Value Ref Range   Magnesium 1.7 1.7 - 2.4 mg/dL    Comment: Performed at [facility], [address], [place], [place] [address]  Urinalysis, Routine w reflex microscopic -Urine, Clean Catch     Status: Abnormal   Collection Time: 10/10/22  2:32 PM  Result Value Ref Range  Color, Urine AMBER (A) YELLOW    Comment: BIOCHEMICALS MAY BE AFFECTED BY COLOR   APPearance CLOUDY (A) CLEAR   Specific Gravity, Urine 1.019 1.005 - 1.030   pH 5.0 5.0 - 8.0   Glucose, UA NEGATIVE NEGATIVE mg/dL   Hgb urine dipstick SMALL (A) NEGATIVE   Bilirubin Urine NEGATIVE NEGATIVE   Ketones, ur NEGATIVE NEGATIVE mg/dL   Protein, ur 045 (A) NEGATIVE mg/dL   Nitrite NEGATIVE NEGATIVE   Leukocytes,Ua NEGATIVE NEGATIVE   RBC / HPF 0-5 0 - 5 RBC/hpf   WBC, UA 11-20 0 - 5 WBC/hpf   Bacteria, UA RARE (A) NONE SEEN   Squamous Epithelial / HPF 0-5 0 - 5 /HPF   WBC Clumps PRESENT    Mucus PRESENT    Hyaline Casts, UA PRESENT     Comment: Performed at [facility], [address], [place], [place] [address]   Personally reviewed- dilated enlarged appendix with stranding no signs of  rupture CT Renal Stone Study  Result Date: 10/10/2022 CLINICAL DATA:  Right lower quadrant/flank pain concerning for kidney stone EXAM: CT ABDOMEN AND PELVIS WITHOUT CONTRAST TECHNIQUE: Multidetector CT imaging of the abdomen and pelvis was performed following the standard protocol without IV contrast. RADIATION DOSE REDUCTION: This exam was performed according to the departmental dose-optimization program which includes automated exposure control, adjustment of the mA and/or kV according to patient size and/or use of iterative reconstruction technique. COMPARISON:  Remote prior CT scan of the abdomen and pelvis 05/28/2014 FINDINGS: Lower chest: Linear atelectasis versus scarring in the lung bases bilaterally. No acute abnormality. Atherosclerotic calcifications throughout the coronary arteries. Normal size heart. No pleural effusion. Hepatobiliary: No focal liver abnormality is seen. Status post cholecystectomy. No biliary dilatation. Pancreas: Unremarkable. No pancreatic ductal dilatation or surrounding inflammatory changes. Spleen: Normal in size without focal abnormality. Adrenals/Urinary Tract: Adrenal glands are unremarkable. Kidneys are normal, without renal calculi, focal lesion, or hydronephrosis. Bladder is unremarkable. Stomach/Bowel: Diffuse mild distension of small bowel without definitive transition point suggestive of ileus. A dilated tubular structure is present extending from the cecum over the right pelvic sidewall measuring up to 1.2 cm in diameter. Mild stranding in the adjacent fat. Findings are suggestive of acute appendicitis. No evidence of rupture or abscess given limitations of non contrasted imaging. Vascular/Lymphatic: Limited evaluation in the absence of intravenous contrast. Atherosclerotic calcifications present throughout the aorta. No aneurysm. Reproductive: Status post hysterectomy. No adnexal masses. Other: No abdominal wall hernia or abnormality. No abdominopelvic ascites.  Musculoskeletal: Focal degenerative changes at L2-L3. Gas within the disc space suggests the absence of active discitis. IMPRESSION: 1. CT findings are most consistent with acute suppurative appendicitis although evaluation is limited in the absence of intravenous contrast. No evidence of free air or fluid collection to suggest perforation. 2. Secondary reactive small-bowel ileus. 3.  Aortic Atherosclerosis (ICD10-I70.0). 4. No evidence of nephrolithiasis or hydronephrosis. 5. Focal degenerative disc disease at L2-L3. 6. Bibasilar pleuroparenchymal pulmonary scarring. Electronically Signed   By: Malachy Moan M.D.   On: 10/10/2022 15:39     Assessment & Plan:  Elizabeth Bullock is a 57 y.o. female with acute appendicitis and AKI. She also has some degree of ileus with vomiting. She is getting admitted to hospitalist for monitoring post op. Her BP is improved with her fluids. Will leave her foley in after surgery to monitor her UOP.  -Discussed the risk of laparoscopic appendectomy and the option of antibiotics alone. Discussed that in [place] and some trials in the  [place], antibiotics are used for simple appendicitis. Discussed that research shows a 40% failure rate for antibiotics alone.  Discussed risk of surgery including but not limited to bleeding, infection, injury to other organs, normal appendix, and after this discussion the patient has decided to proceed with surgery.   All questions were answered to the satisfaction of the patient.    Lucretia Roers 10/10/2022, 4:19 PM

## 2022-10-10 NOTE — ED Triage Notes (Signed)
Pt reports she has not been able to urinate since Wednesday.

## 2022-10-10 NOTE — ED Provider Notes (Signed)
[place] EMERGENCY DEPARTMENT AT [facility] Provider Note   CSN: [id number] Arrival date & time: 10/10/22  1136     History  Chief Complaint  Patient presents with   Urinary Retention    Perrie Ragin is a 57 y.o. female.  Pt is a 57 yo female with pmhx significant for htn, copd, high cholesterol, gerd, depression, anxiety, bipolar d/o, and breast cancer.  Pt said she's not been able to urinate since Wednesday, 4/10.  She denies f/c.  She had urinary retention once right after having a hysterectomy 20 years ago, but no other problems since then.       Home Medications Prior to Admission medications   Medication Sig Start Date End Date Taking? Authorizing Provider  acetaminophen (TYLENOL) 500 MG tablet Take 1,000 mg by mouth 2 (two) times daily as needed for moderate pain or headache.   Yes [provider]  albuterol (VENTOLIN HFA) 108 (90 Base) MCG/ACT inhaler Inhale 2 puffs into the lungs every 4 (four) hours as needed for wheezing or shortness of breath. 03/30/20  Yes [provider]  atorvastatin (LIPITOR) 20 MG tablet Take 1 tablet (20 mg total) by mouth daily. 01/24/19  Yes Jacquelin Hawking, PA-C  cromolyn (OPTICROM) 4 % ophthalmic solution 1 drop 2 (two) times daily.   Yes [provider]  cyclobenzaprine (FLEXERIL) 10 MG tablet Take 10 mg by mouth daily. 12/10/20  Yes [provider]  fenofibrate (TRICOR) 145 MG tablet Take 145 mg by mouth daily. 12/11/20  Yes [provider]  fluticasone (FLONASE) 50 MCG/ACT nasal spray Place 1 spray into both nostrils 2 (two) times daily.   Yes [provider]  gabapentin (NEURONTIN) 300 MG capsule Take 300 mg by mouth 3 (three) times daily. 06/04/21  Yes [provider]  hydrOXYzine (VISTARIL) 25 MG capsule Take 25 mg by mouth 2 (two) times daily as needed.   Yes [provider]  ibuprofen (ADVIL) 800 MG tablet Take 1 tablet (800 mg total) by mouth every 6  (six) hours as needed. 04/22/21  Yes Vickki Hearing, MD  loratadine (CLARITIN) 10 MG tablet Take 10 mg by mouth daily as needed for allergies.    Yes [provider]  montelukast (SINGULAIR) 10 MG tablet Take 10 mg by mouth daily. 06/06/21  Yes [provider]  omeprazole (PRILOSEC) 40 MG capsule Take 1 capsule by mouth every morning. 10/11/20  Yes [provider]  pramipexole (MIRAPEX) 1.5 MG tablet Take 1 tablet (1.5 mg total) by mouth at bedtime. 03/11/22  Yes Serena Croissant, MD  propranolol (INDERAL) 20 MG tablet Take 20 mg by mouth 2 (two) times daily. For tremors   Yes [provider]  RESTASIS 0.05 % ophthalmic emulsion Place 1 drop into both eyes 2 (two) times daily.   Yes [provider]  risperiDONE (RISPERDAL) 0.5 MG tablet Take 0.5 mg by mouth 2 (two) times daily.   Yes [provider]  spironolactone (ALDACTONE) 50 MG tablet Take 50 mg by mouth daily.   Yes [provider]  SYMBICORT 160-4.5 MCG/ACT inhaler Inhale into the lungs. 05/14/21  Yes [provider]  tamoxifen (NOLVADEX) 10 MG tablet Take 1 tablet by mouth once daily Patient taking differently: Take 10 mg by mouth every evening. 03/11/22  Yes Serena Croissant, MD  venlafaxine XR (EFFEXOR-XR) 150 MG 24 hr capsule Take 150 mg by mouth every evening.   Yes [provider]  potassium chloride SA (KLOR-CON M) 20  MEQ tablet Take 20 mEq by mouth daily. Patient not taking: Reported on 10/10/2022 09/18/22   [provider]      Allergies    Codeine, Morphine and related, and Penicillins    Review of Systems   Review of Systems  Genitourinary:  Positive for difficulty urinating.  All other systems reviewed and are negative.   Physical Exam Updated Vital Signs BP (!) 88/64 (BP Location: Right Arm)   Pulse 77   Temp 97.7 F (36.5 C) (Oral)   Resp 18   Ht  (1.6 m)   Wt 84.8 kg   SpO2 98%   BMI 33.13 kg/m  Physical Exam Vitals and  nursing note reviewed.  Constitutional:      Appearance: Normal appearance.  HENT:     Head: Normocephalic and atraumatic.     Right Ear: External ear normal.     Left Ear: External ear normal.     Nose: Nose normal.  Eyes:     Extraocular Movements: Extraocular movements intact.     Conjunctiva/sclera: Conjunctivae normal.     Pupils: Pupils are equal, round, and reactive to light.  Cardiovascular:     Rate and Rhythm: Normal rate and regular rhythm.     Pulses: Normal pulses.     Heart sounds: Normal heart sounds.  Pulmonary:     Effort: Pulmonary effort is normal.     Breath sounds: Normal breath sounds.  Abdominal:     General: Abdomen is flat. Bowel sounds are normal.     Palpations: Abdomen is soft.  Musculoskeletal:        General: Normal range of motion.     Cervical back: Normal range of motion and neck supple.  Skin:    General: Skin is warm.     Capillary Refill: Capillary refill takes less than 2 seconds.  Neurological:     General: No focal deficit present.     Mental Status: She is alert and oriented to person, place, and time.  Psychiatric:        Mood and Affect: Mood normal.        Behavior: Behavior normal.     ED Results / Procedures / Treatments   Labs (all labs ordered are listed, but only abnormal results are displayed) Labs Reviewed  URINALYSIS, ROUTINE W REFLEX MICROSCOPIC - Abnormal; Notable for the following components:      Result Value   Color, Urine AMBER (*)    APPearance CLOUDY (*)    Hgb urine dipstick SMALL (*)    Protein, ur 100 (*)    Bacteria, UA RARE (*)    All other components within normal limits  CBC WITH DIFFERENTIAL/PLATELET - Abnormal; Notable for the following components:   WBC 14.2 (*)    Neutro Abs 12.6 (*)    Abs Immature Granulocytes 0.14 (*)    All other components within normal limits  BASIC METABOLIC PANEL - Abnormal; Notable for the following components:   Sodium 129 (*)    Potassium 3.2 (*)    Chloride 95  (*)    CO2 18 (*)    BUN 50 (*)    Creatinine, Ser 3.96 (*)    Calcium 8.5 (*)    GFR, Estimated 13 (*)    Anion gap 16 (*)    All other components within normal limits  MAGNESIUM    EKG EKG Interpretation  Date/Time:  Friday October 10 2022 15:06:29 EDT Ventricular Rate:  88 PR Interval:  148  QRS Duration: 80 QT Interval:  356 QTC Calculation: 430 R Axis:   72 Text Interpretation: Normal sinus rhythm Normal ECG When compared with ECG of 07-Jan-2021 10:39, PREVIOUS ECG IS PRESENT No significant change since last tracing Confirmed by Jacalyn Lefevre [phone]) on 10/10/2022 3:16:19 PM  Radiology CT Renal Stone Study  Result Date: 10/10/2022 CLINICAL DATA:  Right lower quadrant/flank pain concerning for kidney stone EXAM: CT ABDOMEN AND PELVIS WITHOUT CONTRAST TECHNIQUE: Multidetector CT imaging of the abdomen and pelvis was performed following the standard protocol without IV contrast. RADIATION DOSE REDUCTION: This exam was performed according to the departmental dose-optimization program which includes automated exposure control, adjustment of the mA and/or kV according to patient size and/or use of iterative reconstruction technique. COMPARISON:  Remote prior CT scan of the abdomen and pelvis 05/28/2014 FINDINGS: Lower chest: Linear atelectasis versus scarring in the lung bases bilaterally. No acute abnormality. Atherosclerotic calcifications throughout the coronary arteries. Normal size heart. No pleural effusion. Hepatobiliary: No focal liver abnormality is seen. Status post cholecystectomy. No biliary dilatation. Pancreas: Unremarkable. No pancreatic ductal dilatation or surrounding inflammatory changes. Spleen: Normal in size without focal abnormality. Adrenals/Urinary Tract: Adrenal glands are unremarkable. Kidneys are normal, without renal calculi, focal lesion, or hydronephrosis. Bladder is unremarkable. Stomach/Bowel: Diffuse mild distension of small bowel without definitive transition  point suggestive of ileus. A dilated tubular structure is present extending from the cecum over the right pelvic sidewall measuring up to 1.2 cm in diameter. Mild stranding in the adjacent fat. Findings are suggestive of acute appendicitis. No evidence of rupture or abscess given limitations of non contrasted imaging. Vascular/Lymphatic: Limited evaluation in the absence of intravenous contrast. Atherosclerotic calcifications present throughout the aorta. No aneurysm. Reproductive: Status post hysterectomy. No adnexal masses. Other: No abdominal wall hernia or abnormality. No abdominopelvic ascites. Musculoskeletal: Focal degenerative changes at L2-L3. Gas within the disc space suggests the absence of active discitis. IMPRESSION: 1. CT findings are most consistent with acute suppurative appendicitis although evaluation is limited in the absence of intravenous contrast. No evidence of free air or fluid collection to suggest perforation. 2. Secondary reactive small-bowel ileus. 3.  Aortic Atherosclerosis (ICD10-I70.0). 4. No evidence of nephrolithiasis or hydronephrosis. 5. Focal degenerative disc disease at L2-L3. 6. Bibasilar pleuroparenchymal pulmonary scarring. Electronically Signed   By: Malachy Moan M.D.   On: 10/10/2022 15:39    Procedures Procedures    Medications Ordered in ED Medications  sodium chloride 0.9 % bolus 1,000 mL (has no administration in time range)  ciprofloxacin (CIPRO) IVPB 400 mg (has no administration in time range)    And  metroNIDAZOLE (FLAGYL) IVPB 500 mg (has no administration in time range)  sodium chloride 0.9 % bolus 1,000 mL (1,000 mLs Intravenous New Bag/Given 10/10/22 1458)    ED Course/ Medical Decision Making/ A&P                             Medical Decision Making Amount and/or Complexity of Data Reviewed Labs: ordered. Radiology: ordered.  Risk Prescription drug management. Decision regarding hospitalization.   This patient presents to the ED  for concern of inability to urinate, this involves an extensive number of treatment options, and is a complaint that carries with it a high risk of complications and morbidity.  The differential diagnosis includes urinary retention, uti, post-renal obstruction   Co morbidities that complicate the patient evaluation  htn, copd, high cholesterol, gerd, depression, anxiety, bipolar d/o, and breast  cancer   Additional history obtained:  Additional history obtained from epic chart review    Lab Tests:  I Ordered, and personally interpreted labs.  The pertinent results include:  cbc with wbc elevated at 14.2, bmp with na low at 129 (140 on 01/09/21), bun 50 and cr 3.96 (9 and 0.82 on 01/09/21)   Imaging Studies ordered:  I ordered imaging studies including ct renal  I independently visualized and interpreted imaging which showed  CT findings are most consistent with acute suppurative  appendicitis although evaluation is limited in the absence of  intravenous contrast. No evidence of free air or fluid collection to  suggest perforation.  2. Secondary reactive small-bowel ileus.  3.  Aortic Atherosclerosis (ICD10-I70.0).  4. No evidence of nephrolithiasis or hydronephrosis.  5. Focal degenerative disc disease at L2-L3.  6. Bibasilar pleuroparenchymal pulmonary scarring.   I agree with the radiologist interpretation   Cardiac Monitoring:  The patient was maintained on a cardiac monitor.  I personally viewed and interpreted the cardiac monitored which showed an underlying rhythm of: nsr   Medicines ordered and prescription drug management:  I ordered medication including ivfs  for dehydration; abx for appendicitis  Reevaluation of the patient after these medicines showed that the patient improved I have reviewed the patients home medicines and have made adjustments as needed   Test Considered:  ct   Critical Interventions:  Abx/fluids   Consultations Obtained:  I  requested consultation with the surgeon (Dr. Henreitta Leber),  and discussed lab and imaging findings as well as pertinent plan - she will see pt in consult Pt d/w Dr. Randol Kern (triad) for admission.   Problem List / ED Course:  Acute appendicitis:  pt given iv abx.  Dr. Henreitta Leber will see pt in consult AKI:  pt given ivfs Urinary retention:  no urinary retention.  Pt only had a little over 100 cc in bladder.  Pt was likely not urinating due to dehydration.   Reevaluation:  After the interventions noted above, I reevaluated the patient and found that they have :improved   Social Determinants of Health:  Lives at home   Dispostion:  After consideration of the diagnostic results and the patients response to treatment, I feel that the patent would benefit from admission.    CRITICAL CARE Performed by: Jacalyn Lefevre   Total critical care time: 30 minutes  Critical care time was exclusive of separately billable procedures and treating other patients.  Critical care was necessary to treat or prevent imminent or life-threatening deterioration.  Critical care was time spent personally by me on the following activities: development of treatment plan with patient and/or surrogate as well as nursing, discussions with consultants, evaluation of patient's response to treatment, examination of patient, obtaining history from patient or surrogate, ordering and performing treatments and interventions, ordering and review of laboratory studies, ordering and review of radiographic studies, pulse oximetry and re-evaluation of patient's condition.         Final Clinical Impression(s) / ED Diagnoses Final diagnoses:  Hypokalemia  Hyponatremia  AKI (acute kidney injury)  Acute appendicitis, unspecified acute appendicitis type    Rx / DC Orders ED Discharge Orders     None         Jacalyn Lefevre, MD 10/10/22 1557

## 2022-10-10 NOTE — Anesthesia Postprocedure Evaluation (Signed)
Anesthesia Post Note  Patient: Elizabeth Bullock  Procedure(s) Performed: APPENDECTOMY LAPAROSCOPIC (Abdomen)  Patient location during evaluation: PACU Anesthesia Type: General Level of consciousness: awake and alert Pain management: pain level controlled Vital Signs Assessment: post-procedure vital signs reviewed and stable Respiratory status: spontaneous breathing, nonlabored ventilation, respiratory function stable and patient connected to nasal cannula oxygen Cardiovascular status: blood pressure returned to baseline and stable Postop Assessment: no apparent nausea or vomiting Anesthetic complications: no   No notable events documented.   Last Vitals:  Vitals:   10/10/22 1500 10/10/22 1615  BP: (!) 88/64 100/60  Pulse: 77 78  Resp:  18  Temp:    SpO2: 98%     Last Pain:  Vitals:   10/10/22 1615  TempSrc:   PainSc: 1                  Windell Norfolk

## 2022-10-10 NOTE — ED Notes (Signed)
Hospitalist in to see pt and states to hold pt until surgery sees her

## 2022-10-10 NOTE — ED Notes (Signed)
Pt states her "normal Syst pressure is 100"

## 2022-10-10 NOTE — ED Notes (Signed)
Bladder scan showed largest amt at 151cc.  In and out cath return of 125cc urine amber color

## 2022-10-10 NOTE — ED Notes (Signed)
Up in BR for UA spec.  Ambs well

## 2022-10-10 NOTE — Anesthesia Preprocedure Evaluation (Signed)
Anesthesia Evaluation  Patient identified by MRN, date of birth, ID band Patient awake    Reviewed: Allergy & Precautions, H&P , NPO status , Patient's Chart, lab work & pertinent test results, reviewed documented beta blocker date and time   Airway Mallampati: II  TM Distance: >3 FB Neck ROM: full    Dental no notable dental hx.    Pulmonary neg pulmonary ROS, COPD, Current Smoker and Patient abstained from smoking.   Pulmonary exam normal breath sounds clear to auscultation       Cardiovascular Exercise Tolerance: Good hypertension, negative cardio ROS  Rhythm:regular Rate:Normal     Neuro/Psych  Headaches PSYCHIATRIC DISORDERS Anxiety Depression Bipolar Disorder   negative neurological ROS  negative psych ROS   GI/Hepatic negative GI ROS, Neg liver ROS,GERD  ,,  Endo/Other  negative endocrine ROS    Renal/GU negative Renal ROS  negative genitourinary   Musculoskeletal   Abdominal   Peds  Hematology negative hematology ROS (+)   Anesthesia Other Findings   Reproductive/Obstetrics negative OB ROS                             Anesthesia Physical Anesthesia Plan  ASA: 2 and emergent  Anesthesia Plan: General and General ETT   Post-op Pain Management:    Induction:   PONV Risk Score and Plan: Ondansetron  Airway Management Planned:   Additional Equipment:   Intra-op Plan:   Post-operative Plan:   Informed Consent: I have reviewed the patients History and Physical, chart, labs and discussed the procedure including the risks, benefits and alternatives for the proposed anesthesia with the patient or authorized representative who has indicated his/her understanding and acceptance.     Dental Advisory Given  Plan Discussed with: CRNA  Anesthesia Plan Comments:        Anesthesia Quick Evaluation

## 2022-10-10 NOTE — ED Notes (Signed)
Patient transported to CT 

## 2022-10-10 NOTE — ED Notes (Signed)
To surgery via strecther  pt called her father to update him

## 2022-10-10 NOTE — Transfer of Care (Signed)
Immediate Anesthesia Transfer of Care Note  Patient: Elizabeth Bullock  Procedure(s) Performed: APPENDECTOMY LAPAROSCOPIC (Abdomen)  Patient Location: PACU  Anesthesia Type:General  Level of Consciousness: drowsy  Airway & Oxygen Therapy: Patient Spontanous Breathing and Patient connected to face mask oxygen  Post-op Assessment: Report given to RN and Post -op Vital signs reviewed and stable  Post vital signs: Reviewed and stable  Last Vitals:  Vitals Value Taken Time  BP 89/66 10/10/22 1810  Temp 98   Pulse 91 10/10/22 1813  Resp 11 10/10/22 1813  SpO2 91 % 10/10/22 1813  Vitals shown include unvalidated device data.  Last Pain:  Vitals:   10/10/22 1615  TempSrc:   PainSc: 1          Complications: No notable events documented.

## 2022-10-10 NOTE — Progress Notes (Signed)
[facility]  Appendix ruptured. Updated her father. Bowel very dilated and inflamed consistent with ileus, NG placed. CXR ordered to confirm. Foley left due to AKI. Urine sent on placement due to being cloudy. Will stay on antibiotics to complete 5 days post op of antibiotics.   Will monitor for bowel function. Updated her father. Sent epic message to team.  Algis Greenhouse, MD [facility] [address] [place], [place] [address] [phone] (office)

## 2022-10-10 NOTE — Op Note (Signed)
[facility]  Date of Surgery: 10/10/2022  Admit Date: 10/10/2022   Performing Service: General  Surgeon(s) and Role:    * Lucretia Roers, MD - Primary   Pre-operative Diagnosis: Acute Appendicitis  Post-operative Diagnosis: Acute Perforated Appendicitis  Procedure Performed: Laparoscopic Appendectomy, JP drain placement   Surgeon: Leatrice Jewels. Henreitta Leber, MD   Assistant: No qualified resident was available.   Anesthesia: General   Findings:  Upon entering the abdomen (organ space), I encountered purulence in the right lower quadrant around the appendix and signs of rupture of the appendix at the base. The appendix was found to be inflamed. There were signs of necrosis and a perforated appendix at the base.  Estimated Blood Loss: Minimal   Specimens:  ID Type Source Tests Collected by Time Destination  1 : Appendix Tissue PATH Appendix SURGICAL PATHOLOGY Lucretia Roers, MD 10/10/2022 1706   A : Urine Culture Urine Urine, Catheterized URINE CULTURE Lucretia Roers, MD 10/10/2022 1658      Complications: None; patient tolerated the procedure well.   Disposition: PACU - hemodynamically stable.   Condition: stable   Indications: The patient presented with a 3 day history of right-sided abdominal pain. A CT revealed findings consistent with acute appendicitis.   Procedure Details  Prior to the procedure, the risks, benefits, complications, treatment options, and expected outcomes were discussed with the patient and/or family, including but not limited to the risk of bleeding, infection, finding of a normal appendix, and the need for conversion to an open procedure. There was concurrence with the proposed plan and informed consent was obtained. The patient was taken to the operating room, identified as [name] and the procedure verified as Laproscopic Appendectomy.    The patient was placed in the supine position and general anesthesia was induced,  along with placement of orogastric tube, SCD's, and a Foley catheter. The abdomen was prepped and draped in a sterile fashion. The abdomen was entered with Veress technique in the infraumbilical incision. Intraperitoneal placement was confirmed with saline drop, low entry pressures, and easy insufflation. A 11 mm optiview trocar was placed under direct visualization with a 0 degree scope. The 10 mm 0 degree scope was placed in the abdomen and no evidence of injury was identified. A 12 mm port was placed in the left lower quadrant of the abdomen after skin incision with trocar placement under direct vision. A careful evaluation of the entire abdomen was carried out. An additional 5 mm port was placed in the suprapubic area under direct vision.  The patient was placed in Trendelenburg and left lateral decubitus position. Upon entering the abdomen (organ space), I encountered purulence in the right lower quadrant around the appendix and signs of rupture of the appendix at the base. .  The small intestines were very inflamed and dilated with inflammatory rind developing on the bowel. The small bowel were retracted in the cephalad and left lateral direction away from the pelvis and right lower quadrant. The patient was found to have an appendix that was very dilated and inflamed with perforation at the base.  The appendix was carefully dissected. A window was made in the mesoappendix at the base of the appendix. The appendix was divided at its base using a standard endo-GIA stapler. I got as far up on the cecum as I could to get past the perforation at the base, but this was difficult as I had difficulty getting the cecum mobilized and getting any purchase on the  cecum.   The staple line was inspected and noted to be intact with no appendix remaining. The mesoappendix was taken with the harmonic energy device. The appendix was placed within an Endocatch specimen bag. There was no evidence of bleeding, leakage, or  complication after division of the appendix. The endocatch bag was removed via the 12 mm port. I decided to patch over the staple line with the sail of treves and sewed this fat over the staple line securing it with 3-0 Silk interrupted suture to buttress the staple line given the proximity of the staple line to the rupture and concern for any potential issues with healing in that region.    Any remaining blood or pus was suctioned out from the abdomen, hemostasis was confirmed.  Some minor irrigation was done in the right lower quadrant and pelvis only.  The appendix was passed off the field as a specimen. A JP drain was placed in the right lower quadrant and brought out the suprapubic port site and secured with 3-0 Nylon. The remaining ports were removed.   The the 12 mm and 10 mm port sites were closed with a 0 Vicryl suture. Local was injected. The trocar site skin wounds were closed using staples given the perforation. The patient was then awakened from general anesthesia, extubated, and taken to PACU for recovery.   Instrument, sponge, and needle counts were correct at the conclusion of the case.   Algis Greenhouse, MD [facility] [address] [place], [place] [address] [phone]([id number])

## 2022-10-10 NOTE — ED Notes (Signed)
Surgeon here to see pt.  Dr Henreitta Leber

## 2022-10-11 ENCOUNTER — Inpatient Hospital Stay (HOSPITAL_COMMUNITY): Payer: Medicaid Other

## 2022-10-11 DIAGNOSIS — K3532 Acute appendicitis with perforation and localized peritonitis, without abscess: Secondary | ICD-10-CM | POA: Diagnosis not present

## 2022-10-11 LAB — BASIC METABOLIC PANEL
Anion gap: 10 (ref 5–15)
BUN: 47 mg/dL — ABNORMAL HIGH (ref 6–20)
CO2: 19 mmol/L — ABNORMAL LOW (ref 22–32)
Calcium: 7.1 mg/dL — ABNORMAL LOW (ref 8.9–10.3)
Chloride: 103 mmol/L (ref 98–111)
Creatinine, Ser: 2.81 mg/dL — ABNORMAL HIGH (ref 0.44–1.00)
GFR, Estimated: 19 mL/min — ABNORMAL LOW (ref >60)
Glucose, Bld: 67 mg/dL — ABNORMAL LOW (ref 70–99)
Potassium: 3 mmol/L — ABNORMAL LOW (ref 3.5–5.1)
Sodium: 132 mmol/L — ABNORMAL LOW (ref 135–145)

## 2022-10-11 LAB — CBC
HCT: 36.9 % (ref 36.0–46.0)
Hemoglobin: 12.1 g/dL (ref 12.0–15.0)
MCH: 26.9 pg (ref 26.0–34.0)
MCHC: 32.8 g/dL (ref 30.0–36.0)
MCV: 82 fL (ref 80.0–100.0)
Platelets: 310 10*3/uL (ref 150–400)
RBC: 4.5 MIL/uL (ref 3.87–5.11)
RDW: 14.1 % (ref 11.5–15.5)
WBC: 10.1 10*3/uL (ref 4.0–10.5)
nRBC: 0 % (ref 0.0–0.2)

## 2022-10-11 LAB — GLUCOSE, CAPILLARY: Glucose-Capillary: 93 mg/dL (ref 70–99)

## 2022-10-11 LAB — HIV ANTIBODY (ROUTINE TESTING W REFLEX): HIV Screen 4th Generation wRfx: NONREACTIVE

## 2022-10-11 MED ORDER — SODIUM CHLORIDE 0.9 % IV BOLUS
1000.0000 mL | Freq: Once | INTRAVENOUS | Status: AC
  Administered 2022-10-11: 1000 mL via INTRAVENOUS

## 2022-10-11 MED ORDER — POTASSIUM CHLORIDE 20 MEQ PO PACK
40.0000 meq | PACK | Freq: Two times a day (BID) | ORAL | Status: AC
  Administered 2022-10-11 (×2): 40 meq via ORAL
  Filled 2022-10-11 (×2): qty 2

## 2022-10-11 MED ORDER — POTASSIUM CHLORIDE 10 MEQ/100ML IV SOLN
10.0000 meq | INTRAVENOUS | Status: AC
  Administered 2022-10-11 (×4): 10 meq via INTRAVENOUS
  Filled 2022-10-11 (×2): qty 100

## 2022-10-11 MED ORDER — HYDRALAZINE HCL 20 MG/ML IJ SOLN: 10.0000 mg | Freq: Four times a day (QID) | INTRAMUSCULAR | Status: DC | PRN

## 2022-10-11 MED ORDER — KCL IN DEXTROSE-NACL 20-5-0.9 MEQ/L-%-% IV SOLN: INTRAVENOUS | Status: DC

## 2022-10-11 MED ORDER — OLOPATADINE HCL 0.1 % OP SOLN
1.0000 [drp] | Freq: Two times a day (BID) | OPHTHALMIC | Status: DC
  Administered 2022-10-11 – 2022-10-16 (×10): 1 [drp] via OPHTHALMIC
  Filled 2022-10-11: qty 5

## 2022-10-11 MED ORDER — SODIUM CHLORIDE 0.9 % IV BOLUS
500.0000 mL | Freq: Once | INTRAVENOUS | Status: AC
  Administered 2022-10-11: 500 mL via INTRAVENOUS

## 2022-10-11 MED ORDER — POTASSIUM CHLORIDE 10 MEQ/100ML IV SOLN
INTRAVENOUS | Status: AC
  Filled 2022-10-11: qty 100

## 2022-10-11 MED ORDER — CIPROFLOXACIN IN D5W 400 MG/200ML IV SOLN
400.0000 mg | INTRAVENOUS | Status: DC
  Administered 2022-10-12: 400 mg via INTRAVENOUS
  Filled 2022-10-11: qty 200

## 2022-10-11 NOTE — Progress Notes (Signed)
Pt purposefully removed NG tube during the night and stated "it was hurting, do I need it back?" Pt educated on the importance of having it and NG tube was placed again. X-ray completed twice to ensure proper placement and was restarted. 80 mL of pink tinged drainage emptied from JP drain. 650 mL of dark green fluid emptied from NG canister. PRN Zofran given to pt for reported nausea during the night.

## 2022-10-11 NOTE — Progress Notes (Signed)
Patient still has not voided on her own. MD informed. Bladder scanned 476. MD ordered to wait and recheck at 1830 to see if she urinates on her own. Pt informed.

## 2022-10-11 NOTE — Progress Notes (Signed)
Pt assisted up to [facility], able to void 200 ml of clear yellow urine. CXR shows NGT tip is in esophagus, tube measuring 25 cm at nare. Required tube length measured and tube advanced to 55 cm at the nare. Intermittent suction resumed and NGT drained 250 ml of green emesis. Pt states she feels better, less suprapubic pressure and no urge to void and also no further nausea since NGT advanced and suction resumed.

## 2022-10-11 NOTE — Progress Notes (Signed)
PROGRESS NOTE     Elizabeth Bullock, is a 57 y.o. female, DOB - Jun 02, 1966, WCH:[id number]  Admit date - 10/10/2022   Admitting Physician Starleen Arms, MD  Outpatient Primary MD for the patient is Remus Loffler, PA-C  LOS - 1  Chief Complaint  Patient presents with   Urinary Retention        Brief Narrative:   57 y.o. female, with past medical history of hypertension, COPD, hyperlipidemia, GERD, depression, anxiety, bipolar disorder, history of breast cancer admitted on 10/10/2022 with abdominal pain and intractable emesis and found to have perforated appendicitis and AKI    -Assessment and Plan: 1)AKI----acute kidney injury -- Baseline creatinine 0.8-1.0 -creatinine is down to 2.81 from 3.96 on admission -10/11/22 -Please give NS 1 liter bolus x 1, then change IVF to D5Ns with kcl at 125 ml/h - renally adjust medications, avoid nephrotoxic agents / dehydration  / hypotension   Intake/Output Summary (Last 24 hours) at 10/11/2022 3536 Last data filed at 10/11/2022 0654 Gross per 24 hour  Intake 3829.11 ml  Output 1820 ml  Net 2009.11 ml   2)Hypoglycemia--- due to n.p.o. status  -change IVF to D5Ns with kcl at 125 ml/hr------Her glucose is low (while NPO)  check accuchecks as ordered---  3)Hypokalemia--- suspect due to GI losses -Replace and recheck -Check magnesium  4) acute suppurative appendicitis--s/p laparoscopic appendectomy for perforated appendicitis on 10/10/2022 -Postop care per general surgeon -Foley catheter removed on 10/11/2022 -No bowel signs or BM show NG tube remains in place -JP drain with serosanguineous output -General surgeon plans to repeat CT abdomen and pelvis with oral contrast at some point to rule out leak prior to deciding on oral feed -IV Cipro and Flagyl per general surgery  5)COPD--- stable, continue bronchodilators  6) history of breast cancer, continue tamoxifen  7)HTN--- IV azelastine as needed  8) nutrition--- continue IV  dextrose while n.p.o. -Monitor glucose checks  9) bipolar disorder--- continue PTA psychiatric medications when oral intake remains  10) postoperative urinary retention--- Foley removed on 10/11/2022 -If patient fails voiding trial may do in and out cath  Status is: Inpatient   Disposition: The patient is from: Home              Anticipated d/c is to: Home              Anticipated d/c date is: 2 days              Patient currently is not medically stable to d/c. Barriers: Not Clinically Stable-   Code Status :  -  Code Status: Full Code   Family Communication:    NA (patient is alert, awake and coherent)   DVT Prophylaxis  :   - SCDs   heparin injection 5,000 Units Start: 10/11/22 1400 SCDs Start: 10/10/22 1934   Lab Results  Component Value Date   PLT 310 10/11/2022    Inpatient Medications  Scheduled Meds:  atorvastatin  20 mg Oral Daily   cromolyn  1 drop Both Eyes BID   cycloSPORINE  1 drop Both Eyes BID   fenofibrate  54 mg Oral Daily   heparin  5,000 Units Subcutaneous Q8H   ipratropium-albuterol  3 mL Nebulization BID   mometasone-formoterol  2 puff Inhalation BID   montelukast  10 mg Oral Daily   pantoprazole  40 mg Oral Daily   potassium chloride  40 mEq Oral BID   pramipexole  1.5 mg Oral QHS   senna  1 tablet Oral BID   tamoxifen  10 mg Oral QPM   Continuous Infusions:  ciprofloxacin     And   metronidazole     ciprofloxacin 400 mg (10/11/22 0631)   dextrose 5 % and 0.9 % NaCl with KCl 20 mEq/L     metronidazole 500 mg (10/11/22 0518)   potassium chloride     sodium chloride     sodium chloride     PRN Meds:.acetaminophen **OR** acetaminophen, albuterol, hydrALAZINE, HYDROmorphone (DILAUDID) injection, ondansetron **OR** ondansetron (ZOFRAN) IV, oxyCODONE, sodium phosphate   Anti-infectives (From admission, onward)    Start     Dose/Rate Route Frequency Ordered Stop   10/11/22 0600  ciprofloxacin (CIPRO) IVPB 400 mg        400 mg 200 mL/hr  over 60 Minutes Intravenous Every 12 hours 10/10/22 1644     10/11/22 0600  metroNIDAZOLE (FLAGYL) IVPB 500 mg        500 mg 100 mL/hr over 60 Minutes Intravenous Every 8 hours 10/10/22 1644     10/10/22 1545  ciprofloxacin (CIPRO) IVPB 400 mg       See Hyperspace for full Linked Orders Report.   400 mg 200 mL/hr over 60 Minutes Intravenous  Once 10/10/22 1544     10/10/22 1545  metroNIDAZOLE (FLAGYL) IVPB 500 mg       See Hyperspace for full Linked Orders Report.   500 mg 100 mL/hr over 60 Minutes Intravenous  Once 10/10/22 1544           Subjective: Elizabeth Bullock today has no fevers, no emesis,  No chest pain,   - Difficulty voiding -Removed NG tube overnight NG tube has been replaced   Objective: Vitals:   10/10/22 2015 10/10/22 2017 10/11/22 0100 10/11/22 0525  BP:   96/60   Pulse:   88 91  Resp:   18 18  Temp:   98.3 F (36.8 C) 98.6 F (37 C)  TempSrc:   Oral Oral  SpO2: 92% 92% 93% 92%  Weight:      Height:        Intake/Output Summary (Last 24 hours) at 10/11/2022 0816 Last data filed at 10/11/2022 0654 Gross per 24 hour  Intake 3829.11 ml  Output 1820 ml  Net 2009.11 ml   Filed Weights   10/10/22 1223  Weight: 84.8 kg    Physical Exam  Gen:- Awake Alert,  in no apparent distress  HEENT:- [place].AT, No sclera icterus Nose- NG tube gastric contents Neck-Supple Neck,No JVD,.  Lungs-  CTAB , fair symmetrical air movement CV- S1, S2 normal, regular  Abd- No significant bowel sounds, abd Soft, appropriate postop tenderness, JP tube with serosanguineous drainage Extremity/Skin:- No  edema, pedal pulses present  Psych-affect is appropriate, oriented x3 Neuro-no new focal deficits, no tremors GU-Foley removed on 10/11/2022  Data Reviewed: I have personally reviewed following labs and imaging studies  CBC: Recent Labs  Lab 10/10/22 1252 10/11/22 0402  WBC 14.2* 10.1  NEUTROABS 12.6*  --   HGB 12.9 12.1  HCT 39.9 36.9  MCV 82.6 82.0  PLT 309 310    Basic Metabolic Panel: Recent Labs  Lab 10/10/22 1252 10/10/22 1306 10/11/22 0402  NA 129*  --  132*  K 3.2*  --  3.0*  CL 95*  --  103  CO2 18*  --  19*  GLUCOSE 92  --  67*  BUN 50*  --  47*  CREATININE 3.96*  --  2.81*  CALCIUM  8.5*  --  7.1*  MG  --  1.7  --    GFR: Estimated Creatinine Clearance: 23.6 mL/min (A) (by C-G formula based on SCr of 2.81 mg/dL (H)).  Radiology Studies: DG Abd 1 View  Result Date: 10/11/2022 CLINICAL DATA:  NG tube placement EXAM: ABDOMEN - 1 VIEW COMPARISON:  10/11/2022 at 0041 hours FINDINGS: Enteric tube in the gastric cardia with its side port at the GE junction. Bilateral lower lobe scarring/atelectasis. Cholecystectomy clips. IMPRESSION: Enteric tube in the gastric cardia with its side port at the GE junction. Electronically Signed   By: Charline Bills M.D.   On: 10/11/2022 03:57   DG Abd 1 View  Result Date: 10/11/2022 CLINICAL DATA:  NG tube placement EXAM: ABDOMEN - 1 VIEW COMPARISON:  10/10/2022 FINDINGS: Enteric tube withdrawn, now terminating at the GE junction, with the side port in the distal esophagus. Advancement at least 10 cm is suggested. IMPRESSION: Enteric tube withdrawn, now terminating at the GE junction, with the side port in the distal esophagus. Advancement at least 10 cm is suggested. Electronically Signed   By: Charline Bills M.D.   On: 10/11/2022 01:02   DG Abd 1 View  Result Date: 10/10/2022 CLINICAL DATA:  NG tube placement EXAM: ABDOMEN - 1 VIEW COMPARISON:  CT abdomen/pelvis dated 10/10/2022 FINDINGS: Enteric tube terminates in the proximal gastric body. Dilated loops of small bowel in the central abdomen, favoring small bowel ileus on recent CT. Cholecystectomy clips. IMPRESSION: Enteric tube terminates in the proximal gastric body. Electronically Signed   By: Charline Bills M.D.   On: 10/10/2022 19:01   CT Renal Stone Study  Result Date: 10/10/2022 CLINICAL DATA:  Right lower quadrant/flank pain  concerning for kidney stone EXAM: CT ABDOMEN AND PELVIS WITHOUT CONTRAST TECHNIQUE: Multidetector CT imaging of the abdomen and pelvis was performed following the standard protocol without IV contrast. RADIATION DOSE REDUCTION: This exam was performed according to the departmental dose-optimization program which includes automated exposure control, adjustment of the mA and/or kV according to patient size and/or use of iterative reconstruction technique. COMPARISON:  Remote prior CT scan of the abdomen and pelvis 05/28/2014 FINDINGS: Lower chest: Linear atelectasis versus scarring in the lung bases bilaterally. No acute abnormality. Atherosclerotic calcifications throughout the coronary arteries. Normal size heart. No pleural effusion. Hepatobiliary: No focal liver abnormality is seen. Status post cholecystectomy. No biliary dilatation. Pancreas: Unremarkable. No pancreatic ductal dilatation or surrounding inflammatory changes. Spleen: Normal in size without focal abnormality. Adrenals/Urinary Tract: Adrenal glands are unremarkable. Kidneys are normal, without renal calculi, focal lesion, or hydronephrosis. Bladder is unremarkable. Stomach/Bowel: Diffuse mild distension of small bowel without definitive transition point suggestive of ileus. A dilated tubular structure is present extending from the cecum over the right pelvic sidewall measuring up to 1.2 cm in diameter. Mild stranding in the adjacent fat. Findings are suggestive of acute appendicitis. No evidence of rupture or abscess given limitations of non contrasted imaging. Vascular/Lymphatic: Limited evaluation in the absence of intravenous contrast. Atherosclerotic calcifications present throughout the aorta. No aneurysm. Reproductive: Status post hysterectomy. No adnexal masses. Other: No abdominal wall hernia or abnormality. No abdominopelvic ascites. Musculoskeletal: Focal degenerative changes at L2-L3. Gas within the disc space suggests the absence of  active discitis. IMPRESSION: 1. CT findings are most consistent with acute suppurative appendicitis although evaluation is limited in the absence of intravenous contrast. No evidence of free air or fluid collection to suggest perforation. 2. Secondary reactive small-bowel ileus. 3.  Aortic Atherosclerosis (ICD10-I70.0). 4.  No evidence of nephrolithiasis or hydronephrosis. 5. Focal degenerative disc disease at L2-L3. 6. Bibasilar pleuroparenchymal pulmonary scarring. Electronically Signed   By: Malachy Moan M.D.   On: 10/10/2022 15:39     Scheduled Meds:  atorvastatin  20 mg Oral Daily   cromolyn  1 drop Both Eyes BID   cycloSPORINE  1 drop Both Eyes BID   fenofibrate  54 mg Oral Daily   heparin  5,000 Units Subcutaneous Q8H   ipratropium-albuterol  3 mL Nebulization BID   mometasone-formoterol  2 puff Inhalation BID   montelukast  10 mg Oral Daily   pantoprazole  40 mg Oral Daily   potassium chloride  40 mEq Oral BID   pramipexole  1.5 mg Oral QHS   senna  1 tablet Oral BID   tamoxifen  10 mg Oral QPM   Continuous Infusions:  ciprofloxacin     And   metronidazole     ciprofloxacin 400 mg (10/11/22 0631)   dextrose 5 % and 0.9 % NaCl with KCl 20 mEq/L     metronidazole 500 mg (10/11/22 0518)   potassium chloride     sodium chloride     sodium chloride      LOS: 1 day   Shon Hale M.D on 10/11/2022 at 8:16 AM  Go to [email or web address] - for contact info  [facility] - Office  [phone]  If 7PM-7AM, please contact night-coverage [email or web address] 10/11/2022, 8:16 AM

## 2022-10-11 NOTE — Progress Notes (Signed)
Rating pain 7 requested tylenol. Tylenol given. Pt requested foley removal. MD informed. Foley removed @ 0945. Assisted patient to bedside commode. Liter bolus infusing now. Surgery incision UTA dressing clean dry intact no drainage seen.

## 2022-10-11 NOTE — Progress Notes (Signed)
[facility] Progress Note  1 Day Post-Op  Subjective: No flatus or BM. NG in place, was accidentally out and replaced last night. Foley out, making urine.   Objective: Vital signs in last 24 hours: Temp:  [97.6 F (36.4 C)-98.6 F (37 C)] 98.6 F (37 C) (04/13 0525) Pulse Rate:  [60-91] 91 (04/13 0525) Resp:  [11-20] 18 (04/13 0525) BP: (88-100)/(60-79) 96/60 (04/13 0100) SpO2:  [86 %-98 %] 86 % (04/13 0840) Last BM Date :  (patient stated she did not know)  Intake/Output from previous day: 04/12 0701 - 04/13 0700 In: 3829.1 [I.V.:2529.1; IV Piggyback:1300] Out: 1820 [Urine:1050; Emesis/NG output:650; Drains:80] Intake/Output this shift: No intake/output data recorded.  General appearance: alert and no distress GI: soft, distended, dressing in place, JP with SS output  Lab Results:  Recent Labs    10/10/22 1252 10/11/22 0402  WBC 14.2* 10.1  HGB 12.9 12.1  HCT 39.9 36.9  PLT 309 310   BMET Recent Labs    10/10/22 1252 10/11/22 0402  NA 129* 132*  K 3.2* 3.0*  CL 95* 103  CO2 18* 19*  GLUCOSE 92 67*  BUN 50* 47*  CREATININE 3.96* 2.81*  CALCIUM 8.5* 7.1*   PT/INR No results for input(s): "LABPROT", "INR" in the last 72 hours.  Studies/Results: DG Abd 1 View  Result Date: 10/11/2022 CLINICAL DATA:  NG tube placement EXAM: ABDOMEN - 1 VIEW COMPARISON:  10/11/2022 at 0041 hours FINDINGS: Enteric tube in the gastric cardia with its side port at the GE junction. Bilateral lower lobe scarring/atelectasis. Cholecystectomy clips. IMPRESSION: Enteric tube in the gastric cardia with its side port at the GE junction. Electronically Signed   By: Charline Bills M.D.   On: 10/11/2022 03:57   DG Abd 1 View  Result Date: 10/11/2022 CLINICAL DATA:  NG tube placement EXAM: ABDOMEN - 1 VIEW COMPARISON:  10/10/2022 FINDINGS: Enteric tube withdrawn, now terminating at the GE junction, with the side port in the distal esophagus. Advancement at least 10 cm  is suggested. IMPRESSION: Enteric tube withdrawn, now terminating at the GE junction, with the side port in the distal esophagus. Advancement at least 10 cm is suggested. Electronically Signed   By: Charline Bills M.D.   On: 10/11/2022 01:02   DG Abd 1 View  Result Date: 10/10/2022 CLINICAL DATA:  NG tube placement EXAM: ABDOMEN - 1 VIEW COMPARISON:  CT abdomen/pelvis dated 10/10/2022 FINDINGS: Enteric tube terminates in the proximal gastric body. Dilated loops of small bowel in the central abdomen, favoring small bowel ileus on recent CT. Cholecystectomy clips. IMPRESSION: Enteric tube terminates in the proximal gastric body. Electronically Signed   By: Charline Bills M.D.   On: 10/10/2022 19:01   CT Renal Stone Study  Result Date: 10/10/2022 CLINICAL DATA:  Right lower quadrant/flank pain concerning for kidney stone EXAM: CT ABDOMEN AND PELVIS WITHOUT CONTRAST TECHNIQUE: Multidetector CT imaging of the abdomen and pelvis was performed following the standard protocol without IV contrast. RADIATION DOSE REDUCTION: This exam was performed according to the departmental dose-optimization program which includes automated exposure control, adjustment of the mA and/or kV according to patient size and/or use of iterative reconstruction technique. COMPARISON:  Remote prior CT scan of the abdomen and pelvis 05/28/2014 FINDINGS: Lower chest: Linear atelectasis versus scarring in the lung bases bilaterally. No acute abnormality. Atherosclerotic calcifications throughout the coronary arteries. Normal size heart. No pleural effusion. Hepatobiliary: No focal liver abnormality is seen. Status post cholecystectomy. No biliary dilatation. Pancreas: Unremarkable.  No pancreatic ductal dilatation or surrounding inflammatory changes. Spleen: Normal in size without focal abnormality. Adrenals/Urinary Tract: Adrenal glands are unremarkable. Kidneys are normal, without renal calculi, focal lesion, or hydronephrosis. Bladder  is unremarkable. Stomach/Bowel: Diffuse mild distension of small bowel without definitive transition point suggestive of ileus. A dilated tubular structure is present extending from the cecum over the right pelvic sidewall measuring up to 1.2 cm in diameter. Mild stranding in the adjacent fat. Findings are suggestive of acute appendicitis. No evidence of rupture or abscess given limitations of non contrasted imaging. Vascular/Lymphatic: Limited evaluation in the absence of intravenous contrast. Atherosclerotic calcifications present throughout the aorta. No aneurysm. Reproductive: Status post hysterectomy. No adnexal masses. Other: No abdominal wall hernia or abnormality. No abdominopelvic ascites. Musculoskeletal: Focal degenerative changes at L2-L3. Gas within the disc space suggests the absence of active discitis. IMPRESSION: 1. CT findings are most consistent with acute suppurative appendicitis although evaluation is limited in the absence of intravenous contrast. No evidence of free air or fluid collection to suggest perforation. 2. Secondary reactive small-bowel ileus. 3.  Aortic Atherosclerosis (ICD10-I70.0). 4. No evidence of nephrolithiasis or hydronephrosis. 5. Focal degenerative disc disease at L2-L3. 6. Bibasilar pleuroparenchymal pulmonary scarring. Electronically Signed   By: Malachy Moan M.D.   On: 10/10/2022 15:39    Anti-infectives: Anti-infectives (From admission, onward)    Start     Dose/Rate Route Frequency Ordered Stop   10/12/22 1000  ciprofloxacin (CIPRO) IVPB 400 mg        400 mg 200 mL/hr over 60 Minutes Intravenous Every 24 hours 10/11/22 1014     10/11/22 0600  ciprofloxacin (CIPRO) IVPB 400 mg  Status:  Discontinued        400 mg 200 mL/hr over 60 Minutes Intravenous Every 12 hours 10/10/22 1644 10/11/22 1014   10/11/22 0600  metroNIDAZOLE (FLAGYL) IVPB 500 mg        500 mg 100 mL/hr over 60 Minutes Intravenous Every 8 hours 10/10/22 1644     10/10/22 1545   ciprofloxacin (CIPRO) IVPB 400 mg  Status:  Discontinued       See Hyperspace for full Linked Orders Report.   400 mg 200 mL/hr over 60 Minutes Intravenous  Once 10/10/22 1544 10/11/22 1011   10/10/22 1545  metroNIDAZOLE (FLAGYL) IVPB 500 mg  Status:  Discontinued       See Hyperspace for full Linked Orders Report.   500 mg 100 mL/hr over 60 Minutes Intravenous  Once 10/10/22 1544 10/11/22 1011       Assessment/Plan: Patient s/p laparoscopic appendectomy for perforated diverticulitis. Had to patch the staple line due to the proximity of the rupture, will keep NPO and NG until have Bms. Will plan for CT with oral contrast prior to any feeding to ensure no leak at that level.   IV antibiotics IVF NPO NG JP in place Labs tomorrow  AKI improving Appreciate hospitalist assistance.    LOS: 1 day    Lucretia Roers 10/11/2022

## 2022-10-11 NOTE — TOC Initial Note (Signed)
Transition of Care [facility]) - Initial/Assessment Note    Patient Details  Name: Elizabeth Bullock MRN: [id number] Date of Birth: September 12, 1965  [facility] CM/SW Contact:    Leitha Bleak, RN Phone Number: 10/11/2022, 10:57 AM  Clinical Narrative:    Patient admitted with Acute perforated appendicitis. NG tube in place. Patient has a high risk for readmission. She lives at home with her parents, drives herself to appointments. She has a Medical laboratory scientific officer and walker to use as needed. No other needs. Plans to discharge home in 2 days. TOC following.                Expected Discharge Plan: Home/Self Care Barriers to Discharge: Continued Medical Work up   Patient Goals and CMS Choice Patient states their goals for this hospitalization and ongoing recovery are:: to go home. CMS Medicare.gov Compare Post Acute Care list provided to:: Patient Choice offered to / list presented to : Patient      Expected Discharge Plan and Services      Living arrangements for the past 2 months: Single Family Home                      Prior Living Arrangements/Services Living arrangements for the past 2 months: Single Family Home Lives with:: Parents Patient language and need for interpreter reviewed:: Yes        Need for Family Participation in Patient Care: Yes (Comment) Care giver support system in place?: Yes (comment)   Criminal Activity/Legal Involvement Pertinent to Current Situation/Hospitalization: No - Comment as needed  Activities of Daily Living Home Assistive Devices/Equipment: Dan Humphreys (specify type) ADL Screening (condition at time of admission) Patient's cognitive ability adequate to safely complete daily activities?: Yes Is the patient deaf or have difficulty hearing?: No Does the patient have difficulty seeing, even when wearing glasses/contacts?: No Does the patient have difficulty concentrating, remembering, or making decisions?: No Patient able to express need for assistance with  ADLs?: Yes Does the patient have difficulty dressing or bathing?: No Independently performs ADLs?: Yes (appropriate for developmental age) Does the patient have difficulty walking or climbing stairs?: No Weakness of Legs: None Weakness of Arms/Hands: None  Permission Sought/Granted                  Emotional Assessment     Affect (typically observed): Accepting Orientation: : Oriented to Place, Oriented to Self, Oriented to  Time, Oriented to Situation Alcohol / Substance Use: Not Applicable Psych Involvement: No (comment)  Admission diagnosis:  Hypokalemia [E87.6] Hyponatremia [E87.1] AKI (acute kidney injury) [N17.9] Acute appendicitis [K35.80] Acute appendicitis, unspecified acute appendicitis type [K35.80] Patient Active Problem List   Diagnosis Date Noted   Acute perforated appendicitis 10/10/2022   Hyponatremia 10/10/2022   AKI (acute kidney injury) 10/10/2022   Neuropathy    Closed nondisplaced fracture of lateral malleolus of right fibula 04/17/2021   Hypokalemia 01/07/2021   Generalized weakness 01/07/2021   Epigastric pain 12/11/2020   Abnormal ultrasound of ovary 12/10/2020   Breast pain, left 10/19/2020   Elevated blood pressure reading 10/19/2020   Hyperlipidemia 09/03/2020   Chronic obstructive pulmonary disease, unspecified 07/23/2020   Discogenic low back pain 07/23/2020   Edema 07/18/2020   Excessive sweating 07/18/2020   Excessive sleepiness 04/05/2020   Snoring 04/05/2020   DDD (degenerative disc disease), lumbar 03/12/2020   Neuropathic pain, arm 03/12/2020   Neuropathic pain, leg, bilateral 03/12/2020   Hot flash due to medication 01/27/2020   GERD (gastroesophageal  reflux disease) 01/27/2020   Restless legs syndrome 12/30/2019   Anxiety 12/28/2019   Bipolar 1 disorder, depressed 12/28/2019   Personal history of malignant neoplasm of breast 12/28/2019   Malignant neoplasm of upper-inner quadrant of left breast in female, estrogen receptor  positive 02/11/2019   Screening breast examination 02/08/2019   Nausea with vomiting 02/09/2015   Pain in the chest    Gastroenteritis    Hypertension    Tobacco abuse    Chest pain 02/08/2015   PCP:  Remus Loffler, PA-C Pharmacy:   [facility] [address], [place] - [address] [place] [address] Phone: [phone] Fax: [phone]     Social Determinants of Health (SDOH) Social History: SDOH Screenings   Transportation Needs: No Transportation Needs (02/08/2019)  Tobacco Use: High Risk (10/10/2022)   SDOH Interventions:     Readmission Risk Interventions    10/11/2022   10:57 AM  Readmission Risk Prevention Plan  Transportation Screening Complete  PCP or Specialist Appt within 3-5 Days Not Complete  HRI or Home Care Consult Complete  Social Work Consult for Recovery Care Planning/Counseling Complete  Palliative Care Screening Not Applicable  Medication Review Oceanographer) Complete

## 2022-10-12 DIAGNOSIS — R338 Other retention of urine: Secondary | ICD-10-CM | POA: Diagnosis not present

## 2022-10-12 DIAGNOSIS — K3532 Acute appendicitis with perforation and localized peritonitis, without abscess: Secondary | ICD-10-CM | POA: Diagnosis not present

## 2022-10-12 LAB — GLUCOSE, CAPILLARY
Glucose-Capillary: 109 mg/dL — ABNORMAL HIGH (ref 70–99)
Glucose-Capillary: 90 mg/dL (ref 70–99)
Glucose-Capillary: 81 mg/dL (ref 70–99)

## 2022-10-12 LAB — URINE CULTURE: Culture: NO GROWTH

## 2022-10-12 LAB — RENAL FUNCTION PANEL
Albumin: 2.3 g/dL — ABNORMAL LOW (ref 3.5–5.0)
Anion gap: 10 (ref 5–15)
BUN: 28 mg/dL — ABNORMAL HIGH (ref 6–20)
CO2: 18 mmol/L — ABNORMAL LOW (ref 22–32)
Calcium: 7 mg/dL — ABNORMAL LOW (ref 8.9–10.3)
Chloride: 107 mmol/L (ref 98–111)
Creatinine, Ser: 1.57 mg/dL — ABNORMAL HIGH (ref 0.44–1.00)
GFR, Estimated: 39 mL/min — ABNORMAL LOW (ref >60)
Glucose, Bld: 92 mg/dL (ref 70–99)
Phosphorus: 2.8 mg/dL (ref 2.5–4.6)
Potassium: 3.3 mmol/L — ABNORMAL LOW (ref 3.5–5.1)
Sodium: 135 mmol/L (ref 135–145)

## 2022-10-12 LAB — MAGNESIUM: Magnesium: 1.6 mg/dL — ABNORMAL LOW (ref 1.7–2.4)

## 2022-10-12 MED ORDER — MAGNESIUM OXIDE -MG SUPPLEMENT 400 (240 MG) MG PO TABS
400.0000 mg | ORAL_TABLET | Freq: Two times a day (BID) | ORAL | Status: AC
  Administered 2022-10-12 (×2): 400 mg via ORAL
  Filled 2022-10-12 (×2): qty 1

## 2022-10-12 MED ORDER — POTASSIUM CHLORIDE 10 MEQ/100ML IV SOLN
10.0000 meq | INTRAVENOUS | Status: AC
  Administered 2022-10-12 (×4): 10 meq via INTRAVENOUS
  Filled 2022-10-12 (×4): qty 100

## 2022-10-12 MED ORDER — CIPROFLOXACIN HCL 250 MG PO TABS
500.0000 mg | ORAL_TABLET | Freq: Two times a day (BID) | ORAL | Status: DC
  Administered 2022-10-12 – 2022-10-14 (×4): 500 mg via ORAL
  Filled 2022-10-12 (×4): qty 2

## 2022-10-12 MED ORDER — METRONIDAZOLE 500 MG PO TABS
500.0000 mg | ORAL_TABLET | Freq: Three times a day (TID) | ORAL | Status: DC
  Administered 2022-10-12 – 2022-10-16 (×12): 500 mg via ORAL
  Filled 2022-10-12 (×12): qty 1

## 2022-10-12 MED ORDER — CIPROFLOXACIN IN D5W 400 MG/200ML IV SOLN: 400.0000 mg | Freq: Two times a day (BID) | INTRAVENOUS | Status: DC

## 2022-10-12 MED ORDER — PHENOL 1.4 % MT LIQD
1.0000 | OROMUCOSAL | Status: DC | PRN
  Administered 2022-10-12: 1 via OROMUCOSAL
  Filled 2022-10-12: qty 177

## 2022-10-12 NOTE — Progress Notes (Signed)
JP dressing changed. Dressing saturated with yellow drainage. No edema or foul smell noted.  Patient tolerated well. Plan of care ongoing.

## 2022-10-12 NOTE — Progress Notes (Addendum)
[facility]  NG causing her pain in the back of her throat. No flatus or Bms.   BP (!) 147/126 (BP Location: Right Arm)   Pulse 94   Temp 97.7 F (36.5 C) (Oral)   Resp 20   Ht 5\' 3"  (1.6 m)   Wt 84.8 kg   SpO2 99%   BMI 33.13 kg/m  Staples c/d/I with no erythema or drainage, JP with serous output  Patient s/p laparoscopic appendectomy and patch over staple line due to proximity to the perforation. Doing well. Hates the NG.  NG, NPO Awaiting bowel function IV antibiotics for perforated appendicitis. Will plan for CT with oral contrast prior to any feeding to ensure no leak at that level.  AKI improving  Algis Greenhouse, MD [facility] [address] [place], [place] [address] [phone] (office)

## 2022-10-12 NOTE — Progress Notes (Addendum)
Progress Note    Elizabeth Bullock  WUJ:[id number] DOB: 04-Sep-1965  DOA: 10/10/2022 PCP: Remus Loffler, PA-C    Brief Narrative:   Chief complaint: 4 day h/o N/V, abdominal pain associated with difficulty urinating.  Medical records reviewed and are as summarized below:  Elizabeth Bullock is an 57 y.o. female with past medical history of hypertension, COPD, hyperlipidemia, GERD, depression, anxiety, bipolar disorder, history of breast cancer admitted on 10/10/2022 with abdominal pain and intractable emesis and found to have perforated appendicitis and AKI.  Underwent laparoscopic appendectomy 10/10/2022.  Given appendiceal rupture, and pus in the abdomen, a JP drain was placed at the time of surgery.  Rolando is now recovering postoperatively with postoperative course complicated by ileus requiring NG tube drainage.  Assessment/Plan:   Principal Problem:   Acute perforated appendicitis Noted to be mildly hypotensive overnight, better this morning.  Status post appendectomy with JP drain in place with postoperative course complicated by ileus status postplacement of an NG tube.  Remains NPO.  Bowel sounds remain quiet and she has not yet passed flatus.  She reports feeling hungry.  Continue Cipro/Flagyl.  Continue pain management.  Active Problems: Hyperlipidemia Continue Lipitor and fenofibrate.    Hypertension Hypotensive overnight.  Hydralazine as needed ordered.  Spironolactone remains on hold.    Hypokalemia/hypomagnesemia Potassium 3.0 this a.m. Will give K runs x 4. Magnesium low as well.  We do have some challenges with IV so we will try to give magnesium by mouth today.    Bipolar 1 disorder, depressed Mood appears to be stable.  Takes Risperdal and Effexor but these are currently on hold as Shanessa remains NPO.    Chronic obstructive pulmonary disease, unspecified Continue Dulera and albuterol as needed.    Personal history of malignant neoplasm of breast Continue  tamoxifen.    Hyponatremia Sodium 129-->132.  Continue to hydrate.    AKI (acute kidney injury) Baseline creatinine 0.8-1.0.  Creatinine improving.  Continue to hydrate.    Acute urinary retention Foley catheter remains in place.  Nutritional status/obesity  Body mass index is 33.13 kg/m.   Family Communication/Anticipated D/C date and plan/Code Status   DVT prophylaxis: heparin injection 5,000 Units Start: 10/11/22 1400 SCDs Start: 10/10/22 1934   Current Level of Care:: Level of care: Med-Surg Code Status: Full Code.  Family Communication: No family currently at the bedside. Disposition Plan: Status is: Inpatient Remains inpatient appropriate because: Postoperative ileus requiring IV fluids and close monitoring due to perforated appendix.   Medical Consultants:   Surgery   Anti-Infectives:   Cipro Flagyl  Subjective:   Anab feels hungry and would like to eat something.  She denies any nausea/vomiting.  Continues to report that she has some abdominal pain, and rates it 9/10.  She has not yet passed flatus.  Objective:    Vitals:   10/11/22 2011 10/11/22 2144 10/11/22 2340 10/12/22 0533  BP:  (!) 79/51 (!) 88/53 (!) 147/126  Pulse:  99  94  Resp:  18  20  Temp:  98.8 F (37.1 C)  97.7 F (36.5 C)  TempSrc:  Oral  Oral  SpO2: 96% 91%  99%  Weight:      Height:        Intake/Output Summary (Last 24 hours) at 10/12/2022 1305 Last data filed at 10/12/2022 0534 Gross per 24 hour  Intake 344.46 ml  Output 1280 ml  Net -935.54 ml   Filed Weights   10/10/22 1223  Weight: 84.8  kg    Exam: General: No acute distress. Cardiovascular: Heart sounds show a regular rate, and rhythm. No gallops or rubs. No murmurs. No JVD. Lungs: Clear to auscultation bilaterally with good air movement. No rales, rhonchi or wheezes. Abdomen: Soft, mildly tender, nondistended with normal active bowel sounds. No masses. No hepatosplenomegaly.  Steri-Strips to abdominal  surgical puncture sites without signs of infection. JP drain with some purulent drainage. Skin: Warm and dry. No rashes or lesions.  Surgical sites look good. Extremities: No clubbing or cyanosis. No edema. Pedal pulses 2+. Psychiatric: Mood and affect are normal. Insight and judgment are good.   Data Reviewed:   I have personally reviewed following labs and imaging studies:  Labs: Labs show the following:   Basic Metabolic Panel: Recent Labs  Lab 10/10/22 1252 10/10/22 1306 10/11/22 0402 10/12/22 0339  NA 129*  --  132* 135  K 3.2*  --  3.0* 3.3*  CL 95*  --  103 107  CO2 18*  --  19* 18*  GLUCOSE 92  --  67* 92  BUN 50*  --  47* 28*  CREATININE 3.96*  --  2.81* 1.57*  CALCIUM 8.5*  --  7.1* 7.0*  MG  --  1.7  --  1.6*  PHOS  --   --   --  2.8   GFR Estimated Creatinine Clearance: 42.3 mL/min (A) (by C-G formula based on SCr of 1.57 mg/dL (H)). Liver Function Tests: Recent Labs  Lab 10/12/22 0339  ALBUMIN 2.3*    CBC: Recent Labs  Lab 10/10/22 1252 10/11/22 0402  WBC 14.2* 10.1  NEUTROABS 12.6*  --   HGB 12.9 12.1  HCT 39.9 36.9  MCV 82.6 82.0  PLT 309 310   CBG: Recent Labs  Lab 10/11/22 2339 10/12/22 0530 10/12/22 1120  GLUCAP 93 109* 90    Microbiology Recent Results (from the past 240 hour(s))  Urine Culture     Status: None   Collection Time: 10/10/22  5:19 PM   Specimen: Urine, Catheterized  Result Value Ref Range Status   Specimen Description   Final    URINE, CATHETERIZED Performed at [facility], [address], [place], [place] [address]    Special Requests   Final    NONE Performed at [facility], [address], [place], [place] [address]    Culture   Final    NO GROWTH Performed at [facility], [address] [place], [place] [address]    Report Status 10/12/2022 FINAL  Final    Procedures and diagnostic studies:  DG Chest Port 1 View  Result Date: 10/11/2022 CLINICAL DATA:  [id number] Encounter for nasogastric  (NG) tube placement [id number] EXAM: PORTABLE CHEST 1 VIEW COMPARISON:  Chest x-ray 10/15/2015 FINDINGS: Enteric tube partially visualized overlying the neck with tip at the level of the C7 vertebra. Side port overlies the oropharynx. The heart and mediastinal contours are within normal limits. Low lung volumes. No focal consolidation. No pulmonary edema. No pleural effusion. No pneumothorax. No acute osseous abnormality. IMPRESSION: 1. Enteric tube partially visualized overlying the neck with tip at the level of the C7 vertebra. Enteric tube likely coiled more proximally within the nasopharynx. 2.   Low lung volumes. Electronically Signed   By: Tish Frederickson M.D.   On: 10/11/2022 20:11   DG Abd 1 View  Result Date: 10/11/2022 CLINICAL DATA:  NG tube placement EXAM: ABDOMEN - 1 VIEW COMPARISON:  10/11/2022 at 0041 hours FINDINGS: Enteric  tube in the gastric cardia with its side port at the GE junction. Bilateral lower lobe scarring/atelectasis. Cholecystectomy clips. IMPRESSION: Enteric tube in the gastric cardia with its side port at the GE junction. Electronically Signed   By: Charline Bills M.D.   On: 10/11/2022 03:57   DG Abd 1 View  Result Date: 10/11/2022 CLINICAL DATA:  NG tube placement EXAM: ABDOMEN - 1 VIEW COMPARISON:  10/10/2022 FINDINGS: Enteric tube withdrawn, now terminating at the GE junction, with the side port in the distal esophagus. Advancement at least 10 cm is suggested. IMPRESSION: Enteric tube withdrawn, now terminating at the GE junction, with the side port in the distal esophagus. Advancement at least 10 cm is suggested. Electronically Signed   By: Charline Bills M.D.   On: 10/11/2022 01:02   DG Abd 1 View  Result Date: 10/10/2022 CLINICAL DATA:  NG tube placement EXAM: ABDOMEN - 1 VIEW COMPARISON:  CT abdomen/pelvis dated 10/10/2022 FINDINGS: Enteric tube terminates in the proximal gastric body. Dilated loops of small bowel in the central abdomen, favoring small bowel ileus  on recent CT. Cholecystectomy clips. IMPRESSION: Enteric tube terminates in the proximal gastric body. Electronically Signed   By: Charline Bills M.D.   On: 10/10/2022 19:01   CT Renal Stone Study  Result Date: 10/10/2022 CLINICAL DATA:  Right lower quadrant/flank pain concerning for kidney stone EXAM: CT ABDOMEN AND PELVIS WITHOUT CONTRAST TECHNIQUE: Multidetector CT imaging of the abdomen and pelvis was performed following the standard protocol without IV contrast. RADIATION DOSE REDUCTION: This exam was performed according to the departmental dose-optimization program which includes automated exposure control, adjustment of the mA and/or kV according to patient size and/or use of iterative reconstruction technique. COMPARISON:  Remote prior CT scan of the abdomen and pelvis 05/28/2014 FINDINGS: Lower chest: Linear atelectasis versus scarring in the lung bases bilaterally. No acute abnormality. Atherosclerotic calcifications throughout the coronary arteries. Normal size heart. No pleural effusion. Hepatobiliary: No focal liver abnormality is seen. Status post cholecystectomy. No biliary dilatation. Pancreas: Unremarkable. No pancreatic ductal dilatation or surrounding inflammatory changes. Spleen: Normal in size without focal abnormality. Adrenals/Urinary Tract: Adrenal glands are unremarkable. Kidneys are normal, without renal calculi, focal lesion, or hydronephrosis. Bladder is unremarkable. Stomach/Bowel: Diffuse mild distension of small bowel without definitive transition point suggestive of ileus. A dilated tubular structure is present extending from the cecum over the right pelvic sidewall measuring up to 1.2 cm in diameter. Mild stranding in the adjacent fat. Findings are suggestive of acute appendicitis. No evidence of rupture or abscess given limitations of non contrasted imaging. Vascular/Lymphatic: Limited evaluation in the absence of intravenous contrast. Atherosclerotic calcifications present  throughout the aorta. No aneurysm. Reproductive: Status post hysterectomy. No adnexal masses. Other: No abdominal wall hernia or abnormality. No abdominopelvic ascites. Musculoskeletal: Focal degenerative changes at L2-L3. Gas within the disc space suggests the absence of active discitis. IMPRESSION: 1. CT findings are most consistent with acute suppurative appendicitis although evaluation is limited in the absence of intravenous contrast. No evidence of free air or fluid collection to suggest perforation. 2. Secondary reactive small-bowel ileus. 3.  Aortic Atherosclerosis (ICD10-I70.0). 4. No evidence of nephrolithiasis or hydronephrosis. 5. Focal degenerative disc disease at L2-L3. 6. Bibasilar pleuroparenchymal pulmonary scarring. Electronically Signed   By: Malachy Moan M.D.   On: 10/10/2022 15:39    Medications:    atorvastatin  20 mg Oral Daily   cycloSPORINE  1 drop Both Eyes BID   fenofibrate  54 mg Oral Daily  heparin  5,000 Units Subcutaneous Q8H   ipratropium-albuterol  3 mL Nebulization BID   mometasone-formoterol  2 puff Inhalation BID   montelukast  10 mg Oral Daily   olopatadine  1 drop Both Eyes BID   pantoprazole  40 mg Oral Daily   pramipexole  1.5 mg Oral QHS   senna  1 tablet Oral BID   tamoxifen  10 mg Oral QPM   Continuous Infusions:  ciprofloxacin     dextrose 5 % and 0.9 % NaCl with KCl 20 mEq/L 125 mL/hr at 10/12/22 0139   metronidazole 500 mg (10/12/22 0532)   sodium chloride       LOS: 2 days   Hillery Aldo, MD  [facility]   [facility] How to contact the [facility] Attending or Consulting provider 7A - 7P or covering provider during after hours 7P -7A, for this patient?  Check the care team in [facility] and look for a) attending/consulting TR[facility] provider listed and b) the [facility] team listed Log into [email or web address] and use [place]'s universal password to access. If you do not have the password, please contact the hospital operator. Locate the [facility]  provider you are looking for under [facility] and page to a number that you can be directly reached. If you still have difficulty reaching the provider, please page the [facility] (Director on Call) for the Hospitalists listed on amion for assistance.  10/12/2022, 1:05 PM

## 2022-10-13 DIAGNOSIS — K3532 Acute appendicitis with perforation and localized peritonitis, without abscess: Secondary | ICD-10-CM | POA: Diagnosis not present

## 2022-10-13 LAB — BASIC METABOLIC PANEL
Anion gap: 7 (ref 5–15)
BUN: 10 mg/dL (ref 6–20)
CO2: 18 mmol/L — ABNORMAL LOW (ref 22–32)
Calcium: 7.3 mg/dL — ABNORMAL LOW (ref 8.9–10.3)
Chloride: 112 mmol/L — ABNORMAL HIGH (ref 98–111)
Creatinine, Ser: 0.92 mg/dL (ref 0.44–1.00)
GFR, Estimated: >60 mL/min (ref >60)
Glucose, Bld: 127 mg/dL — ABNORMAL HIGH (ref 70–99)
Potassium: 3.6 mmol/L (ref 3.5–5.1)
Sodium: 137 mmol/L (ref 135–145)

## 2022-10-13 LAB — CBC
HCT: 35.4 % — ABNORMAL LOW (ref 36.0–46.0)
Hemoglobin: 11.4 g/dL — ABNORMAL LOW (ref 12.0–15.0)
MCH: 26.3 pg (ref 26.0–34.0)
MCHC: 32.2 g/dL (ref 30.0–36.0)
MCV: 81.8 fL (ref 80.0–100.0)
Platelets: 314 10*3/uL (ref 150–400)
RBC: 4.33 MIL/uL (ref 3.87–5.11)
RDW: 15 % (ref 11.5–15.5)
WBC: 9 10*3/uL (ref 4.0–10.5)
nRBC: 0 % (ref 0.0–0.2)

## 2022-10-13 LAB — GLUCOSE, CAPILLARY
Glucose-Capillary: 116 mg/dL — ABNORMAL HIGH (ref 70–99)
Glucose-Capillary: 137 mg/dL — ABNORMAL HIGH (ref 70–99)
Glucose-Capillary: 155 mg/dL — ABNORMAL HIGH (ref 70–99)

## 2022-10-13 LAB — MAGNESIUM: Magnesium: 1.2 mg/dL — ABNORMAL LOW (ref 1.7–2.4)

## 2022-10-13 MED ORDER — RISPERIDONE 0.5 MG PO TABS
0.5000 mg | ORAL_TABLET | Freq: Two times a day (BID) | ORAL | Status: DC
  Administered 2022-10-13 – 2022-10-16 (×7): 0.5 mg via ORAL
  Filled 2022-10-13 (×7): qty 1

## 2022-10-13 MED ORDER — VENLAFAXINE HCL ER 75 MG PO CP24
150.0000 mg | ORAL_CAPSULE | Freq: Every evening | ORAL | Status: DC
  Administered 2022-10-13 – 2022-10-15 (×3): 150 mg via ORAL
  Filled 2022-10-13 (×3): qty 2

## 2022-10-13 MED ORDER — MAGNESIUM SULFATE 2 GM/50ML IV SOLN
2.0000 g | Freq: Once | INTRAVENOUS | Status: AC
  Administered 2022-10-13: 2 g via INTRAVENOUS
  Filled 2022-10-13: qty 50

## 2022-10-13 MED ORDER — DOCUSATE SODIUM 100 MG PO CAPS
100.0000 mg | ORAL_CAPSULE | Freq: Two times a day (BID) | ORAL | Status: DC
  Filled 2022-10-13 (×4): qty 1

## 2022-10-13 NOTE — Progress Notes (Addendum)
PROGRESS NOTE    Elizabeth Bullock  UJW:[id number] DOB: [birth year] DOA: 10/10/2022 PCP: Remus Loffler, PA-C     Brief Narrative:  Elizabeth Bullock is an 57 y.o. female with past medical history of hypertension, COPD, hyperlipidemia, GERD, depression, anxiety, bipolar disorder, history of breast cancer admitted on 10/10/2022 with abdominal pain and intractable emesis and found to have perforated appendicitis and AKI.  Underwent laparoscopic appendectomy 10/10/2022.  Given appendiceal rupture, and pus in the abdomen, a JP drain was placed at the time of surgery.  Elizabeth Bullock is now recovering postoperatively with postoperative course complicated by ileus requiring NG tube drainage.   New events last 24 hours / Subjective: Patient had bowel movement this morning.  NG tube remains in place.  She wants her NG tube removed as soon as possible.  Assessment & Plan:  Principal Problem:   Acute perforated appendicitis Active Problems:   Hypertension   Hypokalemia   Bipolar 1 disorder, depressed   Chronic obstructive pulmonary disease, unspecified   Personal history of malignant neoplasm of breast   Hyponatremia   AKI (acute kidney injury)   Acute urinary retention   Acute perforated appendicitis, with postop ileus -Status post appendectomy, JP drain in place -NG tube in place -N.p.o. -Cipro/Flagyl -General surgery managing  Hyperlipidemia -Lipitor, fenofibrate  Essential hypertension -Spironolactone on hold due to hypotensive episode -BP stable this morning  Hypomagnesemia -Replace  Mood disorder -Risperdal, Effexor  COPD -Stable  History of breast cancer -Tamoxifen  AKI -Resolved  Obesity -BMI 33   DVT prophylaxis:  heparin injection 5,000 Units Start: 10/11/22 1400 SCDs Start: 10/10/22 1934  Code Status: Full code Family Communication: None at bedside Disposition Plan: Home once clinically improved Status is: Inpatient Remains inpatient appropriate because: NG  tube   Antimicrobials:  Anti-infectives (From admission, onward)    Start     Dose/Rate Route Frequency Ordered Stop   10/12/22 2000  ciprofloxacin (CIPRO) IVPB 400 mg  Status:  Discontinued        400 mg 200 mL/hr over 60 Minutes Intravenous Every 12 hours 10/12/22 0909 10/12/22 1440   10/12/22 2000  ciprofloxacin (CIPRO) tablet 500 mg        500 mg Oral 2 times daily 10/12/22 1440     10/12/22 1530  metroNIDAZOLE (FLAGYL) tablet 500 mg        500 mg Oral Every 8 hours 10/12/22 1441     10/12/22 1000  ciprofloxacin (CIPRO) IVPB 400 mg  Status:  Discontinued        400 mg 200 mL/hr over 60 Minutes Intravenous Every 24 hours 10/11/22 1014 10/12/22 0909   10/11/22 0600  ciprofloxacin (CIPRO) IVPB 400 mg  Status:  Discontinued        400 mg 200 mL/hr over 60 Minutes Intravenous Every 12 hours 10/10/22 1644 10/11/22 1014   10/11/22 0600  metroNIDAZOLE (FLAGYL) IVPB 500 mg  Status:  Discontinued        500 mg 100 mL/hr over 60 Minutes Intravenous Every 8 hours 10/10/22 1644 10/12/22 1441   10/10/22 1545  ciprofloxacin (CIPRO) IVPB 400 mg  Status:  Discontinued       See Hyperspace for full Linked Orders Report.   400 mg 200 mL/hr over 60 Minutes Intravenous  Once 10/10/22 1544 10/11/22 1011   10/10/22 1545  metroNIDAZOLE (FLAGYL) IVPB 500 mg  Status:  Discontinued       See Hyperspace for full Linked Orders Report.   500 mg 100 mL/hr over 60 Minutes  Intravenous  Once 10/10/22 1544 10/11/22 1011        Objective: Vitals:   10/13/22 0545 10/13/22 0844 10/13/22 0850 10/13/22 0916  BP: 109/75   109/63  Pulse: 96   95  Resp: 18   18  Temp: 97.6 F (36.4 C)   97.9 F (36.6 C)  TempSrc: Oral   Oral  SpO2: 94% 97% 100% 99%  Weight:      Height:        Intake/Output Summary (Last 24 hours) at 10/13/2022 1113 Last data filed at 10/13/2022 0422 Gross per 24 hour  Intake --  Output 1040 ml  Net -1040 ml   Filed Weights   10/10/22 1223  Weight: 84.8 kg    Examination:   General exam: Appears calm and comfortable  Respiratory system: Clear to auscultation. Respiratory effort normal. No respiratory distress. No conversational dyspnea.  Cardiovascular system: S1 & S2 heard, RRR. No murmurs. No pedal edema. Gastrointestinal system: Abdomen is mildly distended, NGT in place  Central nervous system: Alert and oriented. No focal neurological deficits. Speech clear.  Extremities: Symmetric in appearance  Skin: No rashes, lesions or ulcers on exposed skin  Psychiatry: Judgement and insight appear normal. Mood & affect appropriate.   Data Reviewed: I have personally reviewed following labs and imaging studies  CBC: Recent Labs  Lab 10/10/22 1252 10/11/22 0402 10/13/22 0501  WBC 14.2* 10.1 9.0  NEUTROABS 12.6*  --   --   HGB 12.9 12.1 11.4*  HCT 39.9 36.9 35.4*  MCV 82.6 82.0 81.8  PLT 309 310 314   Basic Metabolic Panel: Recent Labs  Lab 10/10/22 1252 10/10/22 1306 10/11/22 0402 10/12/22 0339 10/13/22 0501  NA 129*  --  132* 135 137  K 3.2*  --  3.0* 3.3* 3.6  CL 95*  --  103 107 112*  CO2 18*  --  19* 18* 18*  GLUCOSE 92  --  67* 92 127*  BUN 50*  --  47* 28* 10  CREATININE 3.96*  --  2.81* 1.57* 0.92  CALCIUM 8.5*  --  7.1* 7.0* 7.3*  MG  --  1.7  --  1.6* 1.2*  PHOS  --   --   --  2.8  --    GFR: Estimated Creatinine Clearance: 72.2 mL/min (by C-G formula based on SCr of 0.92 mg/dL). Liver Function Tests: Recent Labs  Lab 10/12/22 0339  ALBUMIN 2.3*   No results for input(s): "LIPASE", "AMYLASE" in the last 168 hours. No results for input(s): "AMMONIA" in the last 168 hours. Coagulation Profile: No results for input(s): "INR", "PROTIME" in the last 168 hours. Cardiac Enzymes: No results for input(s): "CKTOTAL", "CKMB", "CKMBINDEX", "TROPONINI" in the last 168 hours. BNP (last 3 results) No results for input(s): "PROBNP" in the last 8760 hours. HbA1C: No results for input(s): "HGBA1C" in the last 72 hours. CBG: Recent Labs   Lab 10/12/22 0530 10/12/22 1120 10/12/22 1700 10/13/22 0135 10/13/22 0538  GLUCAP 109* 90 81 116* 137*   Lipid Profile: No results for input(s): "CHOL", "HDL", "LDLCALC", "TRIG", "CHOLHDL", "LDLDIRECT" in the last 72 hours. Thyroid Function Tests: No results for input(s): "TSH", "T4TOTAL", "FREET4", "T3FREE", "THYROIDAB" in the last 72 hours. Anemia Panel: No results for input(s): "VITAMINB12", "FOLATE", "FERRITIN", "TIBC", "IRON", "RETICCTPCT" in the last 72 hours. Sepsis Labs: No results for input(s): "PROCALCITON", "LATICACIDVEN" in the last 168 hours.  Recent Results (from the past 240 hour(s))  Urine Culture     Status: None  Collection Time: 10/10/22  5:19 PM   Specimen: Urine, Catheterized  Result Value Ref Range Status   Specimen Description   Final    URINE, CATHETERIZED Performed at [facility], [address], [place], [place] [address]    Special Requests   Final    NONE Performed at [facility], [address], [place], [place] [address]    Culture   Final    NO GROWTH Performed at [facility], [address], [place], [place] [address]    Report Status 10/12/2022 FINAL  Final      Radiology Studies: DG Chest Port 1 View  Result Date: 10/11/2022 CLINICAL DATA:  [id number] Encounter for nasogastric (NG) tube placement [id number] EXAM: PORTABLE CHEST 1 VIEW COMPARISON:  Chest x-ray 10/15/2015 FINDINGS: Enteric tube partially visualized overlying the neck with tip at the level of the C7 vertebra. Side port overlies the oropharynx. The heart and mediastinal contours are within normal limits. Low lung volumes. No focal consolidation. No pulmonary edema. No pleural effusion. No pneumothorax. No acute osseous abnormality. IMPRESSION: 1. Enteric tube partially visualized overlying the neck with tip at the level of the C7 vertebra. Enteric tube likely coiled more proximally within the nasopharynx. 2.   Low lung volumes. Electronically Signed   By: Tish Frederickson M.D.    On: 10/11/2022 20:11      Scheduled Meds:  atorvastatin  20 mg Oral Daily   ciprofloxacin  500 mg Oral BID   cycloSPORINE  1 drop Both Eyes BID   fenofibrate  54 mg Oral Daily   heparin  5,000 Units Subcutaneous Q8H   ipratropium-albuterol  3 mL Nebulization BID   metroNIDAZOLE  500 mg Oral Q8H   mometasone-formoterol  2 puff Inhalation BID   montelukast  10 mg Oral Daily   olopatadine  1 drop Both Eyes BID   pantoprazole  40 mg Oral Daily   pramipexole  1.5 mg Oral QHS   senna  1 tablet Oral BID   tamoxifen  10 mg Oral QPM   Continuous Infusions:  dextrose 5 % and 0.9 % NaCl with KCl 20 mEq/L 125 mL/hr at 10/13/22 0050   sodium chloride       LOS: 3 days   Time spent: 35 minutes   Noralee Stain, DO [facility] 10/13/2022, 11:13 AM   Available via Epic secure chat 7am-7pm After these hours, please refer to coverage provider listed on [email or web address]

## 2022-10-13 NOTE — Progress Notes (Signed)
[facility]  Small BM. No flatus. Hates NG. Output minimal <350. JP with 40 serous output.  BP 109/63 (BP Location: Right Arm)   Pulse 95   Temp 97.9 F (36.6 C) (Oral)   Resp 18   Ht 5\' 3"  (1.6 m)   Wt 84.8 kg   SpO2 99%   BMI 33.13 kg/m  Soft, nondistended, appropriately tender, staples c/d/I without erythema or drainage, JP with serous fluid, slightly cloudy   Patient  s/p laparoscopic appendectomy and patch over staple line due to proximity to the perforation.   Npo with ice chips and meds Colace added Antibiotics for perforated appendicitis If does ok with ice, likely CT tomorrow to ensure no leak at the staple line Aki resolved   Discussed with team.   Algis Greenhouse, MD [facility] [address] [place], [place] [address] [phone] (office)

## 2022-10-13 NOTE — Progress Notes (Signed)
Patient had bowel movement this am and is passing gas.  MD notified.

## 2022-10-13 NOTE — Progress Notes (Signed)
Patient has slept very little during the night. Patient has been up many times to the bedside commode independently. Patient has stated several times, "I want this NG tube out of my nose this morning". Patient has been cooperative and compliant. Prn medication given 2 times during this shift. Plan of care ongoing.

## 2022-10-13 NOTE — Progress Notes (Signed)
   10/13/22 2350  Assess: MEWS Score  Temp 98.4 F (36.9 C)  BP 100/62  MAP (mmHg) 73  Pulse Rate 91  Resp (!) 24  SpO2 91 %  O2 Device Room Air  Assess: MEWS Score  MEWS Temp 0  MEWS Systolic 1  MEWS Pulse 0  MEWS RR 1  MEWS LOC 0  MEWS Score 2  MEWS Score Color Yellow  Notify: Charge Nurse/RN  Name of Charge Nurse/RN Notified Education officer, community  Assess: SIRS CRITERIA  SIRS Temperature  0  SIRS Pulse 1  SIRS Respirations  1  SIRS WBC 0  SIRS Score Sum  2

## 2022-10-14 ENCOUNTER — Ambulatory Visit: Payer: Medicaid Other | Admitting: Neurology

## 2022-10-14 ENCOUNTER — Inpatient Hospital Stay (HOSPITAL_COMMUNITY): Payer: Medicaid Other

## 2022-10-14 ENCOUNTER — Encounter (HOSPITAL_COMMUNITY): Payer: Self-pay | Admitting: Radiology

## 2022-10-14 DIAGNOSIS — K3532 Acute appendicitis with perforation and localized peritonitis, without abscess: Secondary | ICD-10-CM | POA: Diagnosis not present

## 2022-10-14 LAB — CBC
HCT: 34.3 % — ABNORMAL LOW (ref 36.0–46.0)
Hemoglobin: 11.4 g/dL — ABNORMAL LOW (ref 12.0–15.0)
MCH: 26.8 pg (ref 26.0–34.0)
MCHC: 33.2 g/dL (ref 30.0–36.0)
MCV: 80.7 fL (ref 80.0–100.0)
Platelets: 394 10*3/uL (ref 150–400)
RBC: 4.25 MIL/uL (ref 3.87–5.11)
RDW: 15.2 % (ref 11.5–15.5)
WBC: 9.4 10*3/uL (ref 4.0–10.5)
nRBC: 0 % (ref 0.0–0.2)

## 2022-10-14 LAB — BASIC METABOLIC PANEL
Anion gap: 2 — ABNORMAL LOW (ref 5–15)
BUN: 5 mg/dL — ABNORMAL LOW (ref 6–20)
CO2: 18 mmol/L — ABNORMAL LOW (ref 22–32)
Calcium: 7.7 mg/dL — ABNORMAL LOW (ref 8.9–10.3)
Chloride: 115 mmol/L — ABNORMAL HIGH (ref 98–111)
Creatinine, Ser: 0.8 mg/dL (ref 0.44–1.00)
GFR, Estimated: >60 mL/min (ref >60)
Glucose, Bld: 119 mg/dL — ABNORMAL HIGH (ref 70–99)
Potassium: 3.7 mmol/L (ref 3.5–5.1)
Sodium: 135 mmol/L (ref 135–145)

## 2022-10-14 LAB — SURGICAL PATHOLOGY

## 2022-10-14 LAB — MAGNESIUM: Magnesium: 1.3 mg/dL — ABNORMAL LOW (ref 1.7–2.4)

## 2022-10-14 MED ORDER — MAGNESIUM SULFATE 2 GM/50ML IV SOLN
2.0000 g | Freq: Once | INTRAVENOUS | Status: AC
  Administered 2022-10-14: 2 g via INTRAVENOUS
  Filled 2022-10-14: qty 50

## 2022-10-14 MED ORDER — IOHEXOL 300 MG/ML  SOLN
100.0000 mL | Freq: Once | INTRAMUSCULAR | Status: AC | PRN
  Administered 2022-10-14: 100 mL via INTRAVENOUS

## 2022-10-14 MED ORDER — SODIUM CHLORIDE 0.9 % IV SOLN
2.0000 g | Freq: Three times a day (TID) | INTRAVENOUS | Status: DC
  Administered 2022-10-14 – 2022-10-16 (×6): 2 g via INTRAVENOUS
  Filled 2022-10-14 (×6): qty 12.5

## 2022-10-14 NOTE — Progress Notes (Signed)
Pharmacy Antibiotic Note  Elizabeth Bullock is a 57 y.o. female admitted on 10/10/2022 with pneumonia.  Pharmacy has been consulted for cefepime dosing.  Plan: Cefepime 2000 mg IV every 8 hours. Monitor labs, c/s, and patient improvement.  Height:  (160 cm) Weight: 84.8 kg (187 lb) IBW/kg (Calculated) : 52.4  Temp (24hrs), Avg:97.9 F (36.6 C), Min:97.4 F (36.3 C), Max:98.4 F (36.9 C)  Recent Labs  Lab 10/10/22 1252 10/11/22 0402 10/12/22 0339 10/13/22 0501 10/14/22 0411  WBC 14.2* 10.1  --  9.0 9.4  CREATININE 3.96* 2.81* 1.57* 0.92 0.80    Estimated Creatinine Clearance: 83 mL/min (by C-G formula based on SCr of 0.8 mg/dL).    Allergies  Allergen Reactions   Codeine Hives and Nausea And Vomiting   Morphine And Related Nausea Only   Penicillins Hives    Did it involve swelling of the face/tongue/throat, SOB, or low BP? No Did it involve sudden or severe rash/hives, skin peeling, or any reaction on the inside of your mouth or nose? No Did you need to seek medical attention at a hospital or doctor's office? No When did it last happen?      10+ years If all above answers are "NO", may proceed with cephalosporin use.     Antimicrobials this admission: Cefepime 4/16 >> Cipro 4/15 >> 4/16 Flagyl 4/15 >>  Microbiology results: 4/12 UCx: ng   Thank you for allowing pharmacy to be a part of this patient's care.  Tad Moore 10/14/2022 2:25 PM

## 2022-10-14 NOTE — Discharge Instructions (Signed)
Discharge Laparoscopic Surgery Instructions:  JP Drain Care:  IF THE COLOR OF YOUR DRAIN OUTPUT CHANGES FROM LIGHT YELLOW TO ANY BROWN CALL THE OFFICE. [phone] or call [phone] for weekend or after hours.  Please keep the drain clean and dry. Please do not mess with or cut the stitch that is keeping the drain in place. Secure the drain to your clothes so that it does not get dislodged.  Please record the output from the drain daily including the color and the amount in milliliters.  Strip the tubing daily to prevent clogging.  Please keep the drain covered with plastic and tape when you shower so that the entry site to your skin does not get wet.   Common Complaints: Right shoulder pain is common after laparoscopic surgery. This is secondary to the gas used in the surgery being trapped under the diaphragm.  Walk to help your body absorb the gas. This will improve in a few days. Pain at the port sites are common, especially the larger port sites. This will improve with time.  Some nausea is common and poor appetite. The main goal is to stay hydrated the first few days after surgery.   Diet/ Activity: Diet as tolerated. You may not have an appetite, but it is important to stay hydrated. Drink 64 ounces of water a day. Your appetite will return with time.  Shower per your regular routine daily.  Do not take hot showers. Take warm showers that are less than 10 minutes. Rest and listen to your body, but do not remain in bed all day.  Walk everyday for at least 15-20 minutes. Deep cough and move around every 1-2 hours in the first few days after surgery.  Do not lift > 10 lbs, perform excessive bending, pushing, pulling, squatting for 1-2 weeks after surgery.  Do not pick at the dermabond glue on your incision sites.  This glue film will remain in place for 1-2 weeks and will start to peel off.  Do not place lotions or balms on your incision unless instructed to specifically  by Dr. Henreitta Leber.   Pain Expectations and Narcotics: -After surgery you will have pain associated with your incisions and this is normal. The pain is muscular and nerve pain, and will get better with time. -You are encouraged and expected to take non narcotic medications like tylenol and ibuprofen (when able) to treat pain as multiple modalities can aid with pain treatment. -Narcotics are only used when pain is severe or there is breakthrough pain. -You are not expected to have a pain score of 0 after surgery, as we cannot prevent pain. A pain score of 3-4 that allows you to be functional, move, walk, and tolerate some activity is the goal. The pain will continue to improve over the days after surgery and is dependent on your surgery. -Due to [place] law, we are only able to give a certain amount of pain medication to treat post operative pain, and we only give additional narcotics on a patient by patient basis.  -For most laparoscopic surgery, studies have shown that the majority of patients only need 10-15 narcotic pills, and for open surgeries most patients only need 15-20.   -Having appropriate expectations of pain and knowledge of pain management with non narcotics is important as we do not want anyone to become addicted to narcotic pain medication.  -Using ice packs in the first 48 hours and heating pads after 48 hours, wearing an abdominal binder (when recommended), and  using over the counter medications are all ways to help with pain management.   -Simple acts like meditation and mindfulness practices after surgery can also help with pain control and research has proven the benefit of these practices.  Medication: Take tylenol and ibuprofen as needed for pain control, alternating every 4-6 hours.  Example:  Tylenol  @ 6am, 12noon, 6pm, (Do not exceed  of tylenol a day). Ibuprofen  @ 9am, 3pm, 9pm, 3am (Do not exceed  of ibuprofen a day).  Take Roxicodone for  breakthrough pain every 4 hours.  Take Colace for constipation related to narcotic pain medication. If you do not have a bowel movement in 2 days, take Miralax over the counter.  Drink plenty of water to also prevent constipation.   Contact Information: If you have questions or concerns, please call our office, [phone], Monday- Thursday 8AM-5PM and Friday 8AM-12Noon.  If it is after hours or on the weekend, please call [facility] Main Number, [phone], [phone], and ask to speak to the surgeon on call for Dr. Henreitta Leber at [facility].

## 2022-10-14 NOTE — Progress Notes (Signed)
[facility]  Overall progressing. Having Bms. No nausea. Doing the contrast for CT scan.   BP 105/60 (BP Location: Right Arm)   Pulse 91   Temp 97.9 F (36.6 C) (Oral)   Resp (!) 24   Ht 5\' 3"  (1.6 m)   Wt 84.8 kg   SpO2 100%   BMI 33.13 kg/m  Bruising at port sites, staples c/d/I with no erythema or drainage JP with SS output   Patient s/p laparoscopic appendectomy and patch over staple line due to proximity to the perforation.   CT today to ensure staple line intact and not leaking before feeding  Antibiotics Good UOP  Updated team.    Algis Greenhouse, MD [facility] [address] [place], [place] [address] [phone] (office)

## 2022-10-14 NOTE — Progress Notes (Addendum)
PROGRESS NOTE    Elizabeth Bullock  WGN:[id number] DOB: Jan 16, 1966 DOA: 10/10/2022 PCP: Remus Loffler, PA-C     Brief Narrative:  Elizabeth Bullock is an 57 y.o. female with past medical history of hypertension, COPD, hyperlipidemia, GERD, depression, anxiety, bipolar disorder, history of breast cancer admitted on 10/10/2022 with abdominal pain and intractable emesis and found to have perforated appendicitis and AKI.  Underwent laparoscopic appendectomy 10/10/2022.  Given appendiceal rupture, and pus in the abdomen, a JP drain was placed at the time of surgery.  Elizabeth Bullock is now recovering postoperatively with postoperative course complicated by ileus requiring NG tube drainage.   New events last 24 hours / Subjective: Working on drinking contrast this morning for CT.  NG tube was removed yesterday.  Had some vomiting.  Having bowel movements.  No abdominal pain  Assessment & Plan:  Principal Problem:   Acute perforated appendicitis Active Problems:   Hypertension   Hypokalemia   Bipolar 1 disorder, depressed   Chronic obstructive pulmonary disease, unspecified   Personal history of malignant neoplasm of breast   Hyponatremia   AKI (acute kidney injury)   Acute urinary retention   Acute perforated appendicitis, with postop ileus -Status post appendectomy, JP drain in place -NG tube removed 4/15 -Cipro/Flagyl --> Cefepime/Flagyl -General surgery managing.  Discussed with Dr. Henreitta Leber this morning  HCAP -Seen on CT A/P today. Change antibiotics to cefepime   Hyperlipidemia -Lipitor, fenofibrate  Essential hypertension -Spironolactone on hold due to hypotensive episode -BP stable this morning  Hypomagnesemia -Replace  Mood disorder -Risperdal, Effexor  COPD -Stable  History of breast cancer -Tamoxifen  AKI -Resolved  Obesity -BMI 33   DVT prophylaxis:  heparin injection 5,000 Units Start: 10/11/22 1400 SCDs Start: 10/10/22 1934  Code Status: Full code Family  Communication: None at bedside Disposition Plan: Home once clinically improved Status is: Inpatient Remains inpatient appropriate because: repeat CT    Antimicrobials:  Anti-infectives (From admission, onward)    Start     Dose/Rate Route Frequency Ordered Stop   10/12/22 2000  ciprofloxacin (CIPRO) IVPB 400 mg  Status:  Discontinued        400 mg 200 mL/hr over 60 Minutes Intravenous Every 12 hours 10/12/22 0909 10/12/22 1440   10/12/22 2000  ciprofloxacin (CIPRO) tablet 500 mg        500 mg Oral 2 times daily 10/12/22 1440     10/12/22 1530  metroNIDAZOLE (FLAGYL) tablet 500 mg        500 mg Oral Every 8 hours 10/12/22 1441     10/12/22 1000  ciprofloxacin (CIPRO) IVPB 400 mg  Status:  Discontinued        400 mg 200 mL/hr over 60 Minutes Intravenous Every 24 hours 10/11/22 1014 10/12/22 0909   10/11/22 0600  ciprofloxacin (CIPRO) IVPB 400 mg  Status:  Discontinued        400 mg 200 mL/hr over 60 Minutes Intravenous Every 12 hours 10/10/22 1644 10/11/22 1014   10/11/22 0600  metroNIDAZOLE (FLAGYL) IVPB 500 mg  Status:  Discontinued        500 mg 100 mL/hr over 60 Minutes Intravenous Every 8 hours 10/10/22 1644 10/12/22 1441   10/10/22 1545  ciprofloxacin (CIPRO) IVPB 400 mg  Status:  Discontinued       See Hyperspace for full Linked Orders Report.   400 mg 200 mL/hr over 60 Minutes Intravenous  Once 10/10/22 1544 10/11/22 1011   10/10/22 1545  metroNIDAZOLE (FLAGYL) IVPB 500 mg  Status:  Discontinued       See Hyperspace for full Linked Orders Report.   500 mg 100 mL/hr over 60 Minutes Intravenous  Once 10/10/22 1544 10/11/22 1011        Objective: Vitals:   10/13/22 2350 10/14/22 0416 10/14/22 0801 10/14/22 0803  BP: 100/62 105/60    Pulse: 91 91    Resp: 20 (!) 24    Temp: 98.4 F (36.9 C) 97.9 F (36.6 C)    TempSrc:  Oral    SpO2: 91% 95% 97% 100%  Weight:      Height:        Intake/Output Summary (Last 24 hours) at 10/14/2022 1049 Last data filed at  10/14/2022 0700 Gross per 24 hour  Intake 0 ml  Output 20 ml  Net -20 ml    Filed Weights   10/10/22 1223  Weight: 84.8 kg    Examination:  General exam: Appears calm and comfortable  Respiratory system: Clear to auscultation. Respiratory effort normal. No respiratory distress. No conversational dyspnea.  Cardiovascular system: S1 & S2 heard, RRR. No murmurs. No pedal edema. Gastrointestinal system: Abdomen is mildly distended, +JP drain in place, not TTP  Central nervous system: Alert and oriented. No focal neurological deficits. Speech clear.  Extremities: Symmetric in appearance  Skin: No rashes, lesions or ulcers on exposed skin  Psychiatry: Judgement and insight appear normal. Mood & affect appropriate.   Data Reviewed: I have personally reviewed following labs and imaging studies  CBC: Recent Labs  Lab 10/10/22 1252 10/11/22 0402 10/13/22 0501 10/14/22 0411  WBC 14.2* 10.1 9.0 9.4  NEUTROABS 12.6*  --   --   --   HGB 12.9 12.1 11.4* 11.4*  HCT 39.9 36.9 35.4* 34.3*  MCV 82.6 82.0 81.8 80.7  PLT 309 310 314 394    Basic Metabolic Panel: Recent Labs  Lab 10/10/22 1252 10/10/22 1306 10/11/22 0402 10/12/22 0339 10/13/22 0501 10/14/22 0411  NA 129*  --  132* 135 137 135  K 3.2*  --  3.0* 3.3* 3.6 3.7  CL 95*  --  103 107 112* 115*  CO2 18*  --  19* 18* 18* 18*  GLUCOSE 92  --  67* 92 127* 119*  BUN 50*  --  47* 28* 10 5*  CREATININE 3.96*  --  2.81* 1.57* 0.92 0.80  CALCIUM 8.5*  --  7.1* 7.0* 7.3* 7.7*  MG  --  1.7  --  1.6* 1.2* 1.3*  PHOS  --   --   --  2.8  --   --     GFR: Estimated Creatinine Clearance: 83 mL/min (by C-G formula based on SCr of 0.8 mg/dL). Liver Function Tests: Recent Labs  Lab 10/12/22 0339  ALBUMIN 2.3*    No results for input(s): "LIPASE", "AMYLASE" in the last 168 hours. No results for input(s): "AMMONIA" in the last 168 hours. Coagulation Profile: No results for input(s): "INR", "PROTIME" in the last 168  hours. Cardiac Enzymes: No results for input(s): "CKTOTAL", "CKMB", "CKMBINDEX", "TROPONINI" in the last 168 hours. BNP (last 3 results) No results for input(s): "PROBNP" in the last 8760 hours. HbA1C: No results for input(s): "HGBA1C" in the last 72 hours. CBG: Recent Labs  Lab 10/12/22 1120 10/12/22 1700 10/13/22 0135 10/13/22 0538 10/13/22 2353  GLUCAP 90 81 116* 137* 155*    Lipid Profile: No results for input(s): "CHOL", "HDL", "LDLCALC", "TRIG", "CHOLHDL", "LDLDIRECT" in the last 72 hours. Thyroid Function Tests: No results for input(s): "TSH", "  T4TOTAL", "FREET4", "T3FREE", "THYROIDAB" in the last 72 hours. Anemia Panel: No results for input(s): "VITAMINB12", "FOLATE", "FERRITIN", "TIBC", "IRON", "RETICCTPCT" in the last 72 hours. Sepsis Labs: No results for input(s): "PROCALCITON", "LATICACIDVEN" in the last 168 hours.  Recent Results (from the past 240 hour(s))  Urine Culture     Status: None   Collection Time: 10/10/22  5:19 PM   Specimen: Urine, Catheterized  Result Value Ref Range Status   Specimen Description   Final    URINE, CATHETERIZED Performed at [facility], [address] [place], [place] [address]    Special Requests   Final    NONE Performed at [facility], [address] [place], [place] [address]    Culture   Final    NO GROWTH Performed at [facility], [address], [place], [place] [address]    Report Status 10/12/2022 FINAL  Final      Radiology Studies: No results found.    Scheduled Meds:  atorvastatin  20 mg Oral Daily   ciprofloxacin  500 mg Oral BID   cycloSPORINE  1 drop Both Eyes BID   docusate sodium  100 mg Oral BID   fenofibrate  54 mg Oral Daily   heparin  5,000 Units Subcutaneous Q8H   ipratropium-albuterol  3 mL Nebulization BID   metroNIDAZOLE  500 mg Oral Q8H   mometasone-formoterol  2 puff Inhalation BID   montelukast  10 mg Oral Daily   olopatadine  1 drop Both Eyes BID   pantoprazole  40 mg Oral  Daily   pramipexole  1.5 mg Oral QHS   risperiDONE  0.5 mg Oral BID   senna  1 tablet Oral BID   tamoxifen  10 mg Oral QPM   venlafaxine XR  150 mg Oral QPM   Continuous Infusions:  dextrose 5 % and 0.9 % NaCl with KCl 20 mEq/L 125 mL/hr at 10/14/22 0516   sodium chloride       LOS: 4 days   Time spent: 25 minutes   Noralee Stain, DO [facility] 10/14/2022, 10:49 AM   Available via Epic secure chat 7am-7pm After these hours, please refer to coverage provider listed on [email or web address]

## 2022-10-14 NOTE — Progress Notes (Signed)
[facility]  Attempted CT to see if patient had contrast to colon and assess for any leakage at the staple line given the proximity to the rupture. No major fluid collections. Jp in place. There is a small collection not being drained by the JP but wbc normal.   Contrast has not moved into the colon and patient has some what of an ileus. Kub after Ct demonstrates contrast not in the colon.  Will do ad lib sips of juice and water but NOT give her a clear tray yet. Will await further bowel function return.   Algis Greenhouse, MD [facility] [address] [place], [place] [address] [phone] (office)

## 2022-10-14 NOTE — Progress Notes (Signed)
Patient refusing Duoneb treatments.  Patient stated he messes with her stomach.  Patient uses Albuterol inhaler and Symbicort at home.

## 2022-10-15 ENCOUNTER — Inpatient Hospital Stay (HOSPITAL_COMMUNITY): Payer: Medicaid Other

## 2022-10-15 DIAGNOSIS — E871 Hypo-osmolality and hyponatremia: Secondary | ICD-10-CM | POA: Diagnosis not present

## 2022-10-15 DIAGNOSIS — N179 Acute kidney failure, unspecified: Secondary | ICD-10-CM | POA: Diagnosis not present

## 2022-10-15 DIAGNOSIS — K3532 Acute appendicitis with perforation and localized peritonitis, without abscess: Secondary | ICD-10-CM | POA: Diagnosis not present

## 2022-10-15 LAB — CBC
HCT: 39.7 % (ref 36.0–46.0)
Hemoglobin: 12.9 g/dL (ref 12.0–15.0)
MCH: 26.8 pg (ref 26.0–34.0)
MCHC: 32.5 g/dL (ref 30.0–36.0)
MCV: 82.5 fL (ref 80.0–100.0)
Platelets: 467 10*3/uL — ABNORMAL HIGH (ref 150–400)
RBC: 4.81 MIL/uL (ref 3.87–5.11)
RDW: 15.2 % (ref 11.5–15.5)
WBC: 14.1 10*3/uL — ABNORMAL HIGH (ref 4.0–10.5)
nRBC: 0 % (ref 0.0–0.2)

## 2022-10-15 LAB — MAGNESIUM: Magnesium: 1.5 mg/dL — ABNORMAL LOW (ref 1.7–2.4)

## 2022-10-15 LAB — BASIC METABOLIC PANEL
Anion gap: 10 (ref 5–15)
BUN: 5 mg/dL — ABNORMAL LOW (ref 6–20)
CO2: 17 mmol/L — ABNORMAL LOW (ref 22–32)
Calcium: 8.3 mg/dL — ABNORMAL LOW (ref 8.9–10.3)
Chloride: 109 mmol/L (ref 98–111)
Creatinine, Ser: 0.85 mg/dL (ref 0.44–1.00)
GFR, Estimated: >60 mL/min (ref >60)
Glucose, Bld: 91 mg/dL (ref 70–99)
Potassium: 4 mmol/L (ref 3.5–5.1)
Sodium: 136 mmol/L (ref 135–145)

## 2022-10-15 LAB — GLUCOSE, CAPILLARY
Glucose-Capillary: 113 mg/dL — ABNORMAL HIGH (ref 70–99)
Glucose-Capillary: 105 mg/dL — ABNORMAL HIGH (ref 70–99)
Glucose-Capillary: 91 mg/dL (ref 70–99)

## 2022-10-15 MED ORDER — MAGNESIUM SULFATE 2 GM/50ML IV SOLN: 2.0000 g | Freq: Once | INTRAVENOUS | Status: DC

## 2022-10-15 NOTE — Hospital Course (Signed)
57 y.o. female with past medical history of hypertension, COPD, hyperlipidemia, GERD, depression, anxiety, bipolar disorder, history of breast cancer admitted on 10/10/2022 with abdominal pain and intractable emesis and found to have perforated appendicitis and AKI. Underwent laparoscopic appendectomy 10/10/2022. Given appendiceal rupture, and pus in the abdomen, a JP drain was placed at the time of surgery. Cande is now recovering postoperatively with postoperative course complicated by ileus requiring NG tube drainage.   She also had new intra-abdominal fluid collections concerning for abscess.  She was continued to IV abx.

## 2022-10-15 NOTE — Progress Notes (Addendum)
PROGRESS NOTE  Elizabeth Bullock XLK:[id number] DOB: [birth year] DOA: 10/10/2022 PCP: Remus Loffler, PA-C  Brief History:  57 y.o. female with past medical history of hypertension, COPD, hyperlipidemia, GERD, depression, anxiety, bipolar disorder, history of breast cancer admitted on 10/10/2022 with abdominal pain and intractable emesis and found to have perforated appendicitis and AKI. Underwent laparoscopic appendectomy 10/10/2022. Given appendiceal rupture, and pus in the abdomen, a JP drain was placed at the time of surgery. Elizabeth Bullock is now recovering postoperatively with postoperative course complicated by ileus requiring NG tube drainage.   She also had new intra-abdominal fluid collections concerning for abscess.  She was continued to IV abx.   Assessment/Plan: Acute perforated appendicitis, with postop ileus -Status post  lap appendectomy 10/10/22, JP drain in place -NG tube removed 4/15 -Cipro/Flagyl --> Cefepime/Flagyl on 4/16 -appreciate general surgery follow up -restarted on clears 4/17   HCAP -10/14/22 CT abd--6.3x2.6x2.7 cm rim enhancing fluid collection in R-hemipelvis;  stomach and prox SB distended without discrete transition point; progressive RLL consolidation; +hepatomegaly  -Changed antibiotics to cefepime 4/16 -check PCT   Hyperlipidemia -Lipitor, fenofibrate   Essential hypertension -Spironolactone on hold due to hypotensive episode -BP stable now   Hypomagnesemia -replete IV   Mood disorder -Continue Risperdal, Effexor   COPD -Stable on RA -continue Dulera   History of breast cancer -Continue Tamoxifen   AKI -baseline creatinine 0.8-1.0 -serum creatinine peaked 3.96 -improved   Obesity -BMI 33.13 -lifestyle modfication      Family Communication: no  Family at bedside  Consultants:  general surgery  Code Status:  FULL   DVT Prophylaxis:  Provencal Heparin   Procedures: As Listed in Progress Note Above  Antibiotics: Cipro  4/12>>4.16 Cefepime 4/16>> Metronidazole 4/12>>     Subjective: Patient denies fevers, chills, headache, chest pain, dyspnea, nausea, vomiting, diarrhea, abdominal pain, dysuria, hematuria, hematochezia, and melena.   Objective: Vitals:   10/14/22 2020 10/15/22 0625 10/15/22 0711 10/15/22 1228  BP: (!) 109/91 112/65  120/65  Pulse: 95 96  98  Resp: (!) 24 (!) 22  16  Temp: 98.8 F (37.1 C) 98.7 F (37.1 C)    TempSrc:      SpO2: 98% 97% 98% 97%  Weight:      Height:        Intake/Output Summary (Last 24 hours) at 10/15/2022 1712 Last data filed at 10/15/2022 0700 Gross per 24 hour  Intake 1475 ml  Output --  Net 1475 ml   Weight change:  Exam:  General:  Pt is alert, follows commands appropriately, not in acute distress HEENT: No icterus, No thrush, No neck mass, [place]/AT Cardiovascular: RRR, S1/S2, no rubs, no gallops Respiratory: bibasilar rales.  Diminished BS RLL.  No wheeze Abdomen: Soft/+BS, non tender, non distended, no guarding Extremities: No edema, No lymphangitis, No petechiae, No rashes, no synovitis   Data Reviewed: I have personally reviewed following labs and imaging studies Basic Metabolic Panel: Recent Labs  Lab 10/10/22 1306 10/11/22 0402 10/12/22 0339 10/13/22 0501 10/14/22 0411 10/15/22 0412  NA  --  132* 135 137 135 136  K  --  3.0* 3.3* 3.6 3.7 4.0  CL  --  103 107 112* 115* 109  CO2  --  19* 18* 18* 18* 17*  GLUCOSE  --  67* 92 127* 119* 91  BUN  --  47* 28* 10 5* 5*  CREATININE  --  2.81* 1.57* 0.92 0.80 0.85  CALCIUM  --  7.1* 7.0* 7.3* 7.7* 8.3*  MG 1.7  --  1.6* 1.2* 1.3* 1.5*  PHOS  --   --  2.8  --   --   --    Liver Function Tests: Recent Labs  Lab 10/12/22 0339  ALBUMIN 2.3*   No results for input(s): "LIPASE", "AMYLASE" in the last 168 hours. No results for input(s): "AMMONIA" in the last 168 hours. Coagulation Profile: No results for input(s): "INR", "PROTIME" in the last 168 hours. CBC: Recent Labs  Lab  10/10/22 1252 10/11/22 0402 10/13/22 0501 10/14/22 0411 10/15/22 0412  WBC 14.2* 10.1 9.0 9.4 14.1*  NEUTROABS 12.6*  --   --   --   --   HGB 12.9 12.1 11.4* 11.4* 12.9  HCT 39.9 36.9 35.4* 34.3* 39.7  MCV 82.6 82.0 81.8 80.7 82.5  PLT 309 310 314 394 467*   Cardiac Enzymes: No results for input(s): "CKTOTAL", "CKMB", "CKMBINDEX", "TROPONINI" in the last 168 hours. BNP: Invalid input(s): "POCBNP" CBG: Recent Labs  Lab 10/13/22 0135 10/13/22 0538 10/13/22 2353 10/15/22 0806 10/15/22 1140  GLUCAP 116* 137* 155* 113* 105*   HbA1C: No results for input(s): "HGBA1C" in the last 72 hours. Urine analysis:    Component Value Date/Time   COLORURINE AMBER (A) 10/10/2022 1432   APPEARANCEUR CLOUDY (A) 10/10/2022 1432   LABSPEC 1.019 10/10/2022 1432   PHURINE 5.0 10/10/2022 1432   GLUCOSEU NEGATIVE 10/10/2022 1432   HGBUR SMALL (A) 10/10/2022 1432   BILIRUBINUR NEGATIVE 10/10/2022 1432   KETONESUR NEGATIVE 10/10/2022 1432   PROTEINUR 100 (A) 10/10/2022 1432   UROBILINOGEN 0.2 05/27/2014 0352   NITRITE NEGATIVE 10/10/2022 1432   LEUKOCYTESUR NEGATIVE 10/10/2022 1432   Sepsis Labs: @LABRCNTIP (procalcitonin:4,lacticidven:4) ) Recent Results (from the past 240 hour(s))  Urine Culture     Status: None   Collection Time: 10/10/22  5:19 PM   Specimen: Urine, Catheterized  Result Value Ref Range Status   Specimen Description   Final    URINE, CATHETERIZED Performed at [facility], [address], [place], [place] [address]    Special Requests   Final    NONE Performed at [facility], [address] [place], [place] [address]    Culture   Final    NO GROWTH Performed at [facility], [address] [place], [place] [address]    Report Status 10/12/2022 FINAL  Final     Scheduled Meds:  atorvastatin  20 mg Oral Daily   cycloSPORINE  1 drop Both Eyes BID   docusate sodium  100 mg Oral BID   fenofibrate  54 mg Oral Daily   heparin  5,000 Units Subcutaneous Q8H    metroNIDAZOLE  500 mg Oral Q8H   mometasone-formoterol  2 puff Inhalation BID   montelukast  10 mg Oral Daily   olopatadine  1 drop Both Eyes BID   pantoprazole  40 mg Oral Daily   pramipexole  1.5 mg Oral QHS   risperiDONE  0.5 mg Oral BID   senna  1 tablet Oral BID   tamoxifen  10 mg Oral QPM   venlafaxine XR  150 mg Oral QPM   Continuous Infusions:  ceFEPime (MAXIPIME) IV 2 g (10/15/22 1413)   dextrose 5 % and 0.9 % NaCl with KCl 20 mEq/L 125 mL/hr at 10/14/22 1657    Procedures/Studies: DG Abd 1 View  Result Date: 10/15/2022 CLINICAL DATA:  Postoperative ileus EXAM: ABDOMEN - 1 VIEW COMPARISON:  KUB 1 day prior FINDINGS: Gaseous  distention of the small bowel measuring up to 5.0 cm is stable to slightly improved since the study from 1 day prior. The colon is normal in caliber. There is no definite free intraperitoneal air, within the confines of supine technique. Right upper quadrant surgical clips and a surgical drain are again noted. The imaged lung bases are clear. IMPRESSION: Gaseous distention of the small bowel appears stable to slightly improved since the study from 1 day prior. Electronically Signed   By: Lesia Hausen M.D.   On: 10/15/2022 08:51   DG Abd 1 View  Result Date: 10/14/2022 CLINICAL DATA:  [id number] Ileus [id number] EXAM: ABDOMEN - 1 VIEW COMPARISON:  CT 10/14/2022 FINDINGS: There is small bowel dilation with normal caliber colon and stone. Surgical drain overlies the right hemiabdomen. No radiopaque calculi overlie the kidneys. There is retained contrast material in the bladder. IMPRESSION: Small bowel dilation likely reflecting postoperative ileus. Recommend continued radiographic follow-up. Electronically Signed   By: Caprice Renshaw M.D.   On: 10/14/2022 14:41   CT ABDOMEN PELVIS W CONTRAST  Result Date: 10/14/2022 CLINICAL DATA:  Abdominal pain, post-op Perforated appendicitis; staple line right at perforation; oversewn/ patched; assess for any leakage of oral contrast  EXAM: CT ABDOMEN AND PELVIS WITH CONTRAST TECHNIQUE: Multidetector CT imaging of the abdomen and pelvis was performed using the standard protocol following bolus administration of intravenous contrast. RADIATION DOSE REDUCTION: This exam was performed according to the departmental dose-optimization program which includes automated exposure control, adjustment of the mA and/or kV according to patient size and/or use of iterative reconstruction technique. CONTRAST:  OMNIPAQUE IOHEXOL 300 MG/ML  SOLN COMPARISON:  10/10/2022 FINDINGS: Lower chest: Progressive right lower lobe airspace consolidation. Mild left basilar atelectasis. Heart size is normal. Hepatobiliary: Liver measures 22 cm in length. No focal liver abnormality is seen. Status post cholecystectomy. No biliary dilatation. Pancreas: Unremarkable. No pancreatic ductal dilatation or surrounding inflammatory changes. Spleen: Normal in size without focal abnormality. Adrenals/Urinary Tract: Unremarkable adrenal glands. Kidneys enhance symmetrically without focal lesion, stone, or hydronephrosis. Ureters are nondilated. Urinary bladder appears unremarkable for the degree of distention. Stomach/Bowel: Interval postsurgical changes following recent laparoscopic appendectomy. Rim enhancing fluid collection in the right hemipelvis overlying the right iliac vessels measuring 6.3 x 2.6 x 2.7 cm (series 7, image 59; series 2, image 71). Small volume simple-appearing free fluid in the right pericolic gutter. A surgical drain is in place within the right lower quadrant extending into the mid right abdomen. The stomach and proximal small bowel are distended with intermediate density fluid, likely oral contrast. Small bowel loops measure up to 4.8 cm in diameter. Multiple air-fluid levels. No abrupt transition point. Distal small bowel within the pelvis is decompressed and appears mildly thick walled. No focal colonic wall thickening. Vascular/Lymphatic: Aortic  atherosclerosis. No enlarged abdominal or pelvic lymph nodes. Reproductive: Status post hysterectomy. No adnexal masses. Other: No extraluminal contrast is seen, although contrast has not reached the distal small bowel or colon. No free intraperitoneal air. Laparoscopic port sites are noted at the umbilicus and left lower quadrant. Musculoskeletal: No new or acute bony findings. IMPRESSION: 1. Interval postsurgical changes following recent laparoscopic appendectomy. Rim-enhancing fluid collection in the right hemipelvis measuring 6.3 x 2.6 x 2.7 cm, most compatible with abscess. 2. The stomach and proximal small bowel are distended with intermediate density fluid, likely oral contrast. No abrupt transition point. Distal small bowel within the pelvis is decompressed and appears mildly thick walled. Findings are favored to represent adynamic ileus  and reactive enteritis. 3. No evidence of extraluminal contrast leakage, although contrast has not reached the distal small bowel or colon. Continued follow-up recommended. 4. Progressive right lower lobe airspace consolidation, concerning for pneumonia. 5. Hepatomegaly. 6. Aortic atherosclerosis (ICD10-I70.0). Electronically Signed   By: Duanne Guess D.O.   On: 10/14/2022 12:47   DG Chest Port 1 View  Result Date: 10/11/2022 CLINICAL DATA:  [id number] Encounter for nasogastric (NG) tube placement [id number] EXAM: PORTABLE CHEST 1 VIEW COMPARISON:  Chest x-ray 10/15/2015 FINDINGS: Enteric tube partially visualized overlying the neck with tip at the level of the C7 vertebra. Side port overlies the oropharynx. The heart and mediastinal contours are within normal limits. Low lung volumes. No focal consolidation. No pulmonary edema. No pleural effusion. No pneumothorax. No acute osseous abnormality. IMPRESSION: 1. Enteric tube partially visualized overlying the neck with tip at the level of the C7 vertebra. Enteric tube likely coiled more proximally within the nasopharynx. 2.    Low lung volumes. Electronically Signed   By: Tish Frederickson M.D.   On: 10/11/2022 20:11   DG Abd 1 View  Result Date: 10/11/2022 CLINICAL DATA:  NG tube placement EXAM: ABDOMEN - 1 VIEW COMPARISON:  10/11/2022 at 0041 hours FINDINGS: Enteric tube in the gastric cardia with its side port at the GE junction. Bilateral lower lobe scarring/atelectasis. Cholecystectomy clips. IMPRESSION: Enteric tube in the gastric cardia with its side port at the GE junction. Electronically Signed   By: Charline Bills M.D.   On: 10/11/2022 03:57   DG Abd 1 View  Result Date: 10/11/2022 CLINICAL DATA:  NG tube placement EXAM: ABDOMEN - 1 VIEW COMPARISON:  10/10/2022 FINDINGS: Enteric tube withdrawn, now terminating at the GE junction, with the side port in the distal esophagus. Advancement at least 10 cm is suggested. IMPRESSION: Enteric tube withdrawn, now terminating at the GE junction, with the side port in the distal esophagus. Advancement at least 10 cm is suggested. Electronically Signed   By: Charline Bills M.D.   On: 10/11/2022 01:02   DG Abd 1 View  Result Date: 10/10/2022 CLINICAL DATA:  NG tube placement EXAM: ABDOMEN - 1 VIEW COMPARISON:  CT abdomen/pelvis dated 10/10/2022 FINDINGS: Enteric tube terminates in the proximal gastric body. Dilated loops of small bowel in the central abdomen, favoring small bowel ileus on recent CT. Cholecystectomy clips. IMPRESSION: Enteric tube terminates in the proximal gastric body. Electronically Signed   By: Charline Bills M.D.   On: 10/10/2022 19:01   CT Renal Stone Study  Result Date: 10/10/2022 CLINICAL DATA:  Right lower quadrant/flank pain concerning for kidney stone EXAM: CT ABDOMEN AND PELVIS WITHOUT CONTRAST TECHNIQUE: Multidetector CT imaging of the abdomen and pelvis was performed following the standard protocol without IV contrast. RADIATION DOSE REDUCTION: This exam was performed according to the departmental dose-optimization program which includes  automated exposure control, adjustment of the mA and/or kV according to patient size and/or use of iterative reconstruction technique. COMPARISON:  Remote prior CT scan of the abdomen and pelvis 05/28/2014 FINDINGS: Lower chest: Linear atelectasis versus scarring in the lung bases bilaterally. No acute abnormality. Atherosclerotic calcifications throughout the coronary arteries. Normal size heart. No pleural effusion. Hepatobiliary: No focal liver abnormality is seen. Status post cholecystectomy. No biliary dilatation. Pancreas: Unremarkable. No pancreatic ductal dilatation or surrounding inflammatory changes. Spleen: Normal in size without focal abnormality. Adrenals/Urinary Tract: Adrenal glands are unremarkable. Kidneys are normal, without renal calculi, focal lesion, or hydronephrosis. Bladder is unremarkable. Stomach/Bowel: Diffuse mild distension of small  bowel without definitive transition point suggestive of ileus. A dilated tubular structure is present extending from the cecum over the right pelvic sidewall measuring up to 1.2 cm in diameter. Mild stranding in the adjacent fat. Findings are suggestive of acute appendicitis. No evidence of rupture or abscess given limitations of non contrasted imaging. Vascular/Lymphatic: Limited evaluation in the absence of intravenous contrast. Atherosclerotic calcifications present throughout the aorta. No aneurysm. Reproductive: Status post hysterectomy. No adnexal masses. Other: No abdominal wall hernia or abnormality. No abdominopelvic ascites. Musculoskeletal: Focal degenerative changes at L2-L3. Gas within the disc space suggests the absence of active discitis. IMPRESSION: 1. CT findings are most consistent with acute suppurative appendicitis although evaluation is limited in the absence of intravenous contrast. No evidence of free air or fluid collection to suggest perforation. 2. Secondary reactive small-bowel ileus. 3.  Aortic Atherosclerosis (ICD10-I70.0). 4. No  evidence of nephrolithiasis or hydronephrosis. 5. Focal degenerative disc disease at L2-L3. 6. Bibasilar pleuroparenchymal pulmonary scarring. Electronically Signed   By: Malachy Moan M.D.   On: 10/10/2022 15:39    Catarina Hartshorn, DO  [facility]  If 7PM-7AM, please contact night-coverage [email or web address] Password [email or web address] 10/15/2022, 5:12 PM   LOS: 5 days

## 2022-10-15 NOTE — Progress Notes (Signed)
[facility]  More Bms and Kub with improved ileus. Clinically doing well with JP with some cloudy serous fluid and no fevers. Mild increase in leukocytosis, changed antibiotics to treat for possible PNA.  Had wanted to get CT to assess staple line but contrast did not travel to colon yesterday. CT down today and unavailable.   Tolerating some sips without issues.  BP 112/65 (BP Location: Right Arm)   Pulse 96   Temp 98.7 F (37.1 C)   Resp (!) 22   Ht  (1.6 m)   Wt 84.8 kg   SpO2 98%   BMI 33.13 kg/m  Staples c/d/I with no erythema or drainage, some bruising JP with cloudy serous fluid   CT from yesterday with drain in good position, minor fluid collection but no fevers.  Will follow clinically for now and adv to clears. Discussed with patient and father and discussed the perforated appendicitis and staple line being close to the rupture/ patch of fat over the area. Discussed that we want to be cautious due to risk of this area not healing and leaking.  Labs tomorrow  Algis Greenhouse, MD [facility] [address] [place], [place] [address] [phone] (office)

## 2022-10-16 ENCOUNTER — Encounter (HOSPITAL_COMMUNITY): Payer: Self-pay | Admitting: General Surgery

## 2022-10-16 DIAGNOSIS — E871 Hypo-osmolality and hyponatremia: Secondary | ICD-10-CM | POA: Diagnosis not present

## 2022-10-16 DIAGNOSIS — K3532 Acute appendicitis with perforation and localized peritonitis, without abscess: Secondary | ICD-10-CM | POA: Diagnosis not present

## 2022-10-16 DIAGNOSIS — N179 Acute kidney failure, unspecified: Secondary | ICD-10-CM | POA: Diagnosis not present

## 2022-10-16 LAB — BASIC METABOLIC PANEL
Anion gap: 7 (ref 5–15)
BUN: 7 mg/dL (ref 6–20)
CO2: 16 mmol/L — ABNORMAL LOW (ref 22–32)
Calcium: 8.2 mg/dL — ABNORMAL LOW (ref 8.9–10.3)
Chloride: 110 mmol/L (ref 98–111)
Creatinine, Ser: 0.73 mg/dL (ref 0.44–1.00)
GFR, Estimated: >60 mL/min (ref >60)
Glucose, Bld: 109 mg/dL — ABNORMAL HIGH (ref 70–99)
Potassium: 3.4 mmol/L — ABNORMAL LOW (ref 3.5–5.1)
Sodium: 133 mmol/L — ABNORMAL LOW (ref 135–145)

## 2022-10-16 LAB — CBC WITH DIFFERENTIAL/PLATELET
Abs Immature Granulocytes: 0.9 10*3/uL — ABNORMAL HIGH (ref 0.00–0.07)
Band Neutrophils: 1 %
Basophils Absolute: 0 10*3/uL (ref 0.0–0.1)
Basophils Relative: 0 %
Eosinophils Absolute: 0.4 10*3/uL (ref 0.0–0.5)
Eosinophils Relative: 3 %
HCT: 36.6 % (ref 36.0–46.0)
Hemoglobin: 12 g/dL (ref 12.0–15.0)
Lymphocytes Relative: 17 %
Lymphs Abs: 2.2 10*3/uL (ref 0.7–4.0)
MCH: 26.7 pg (ref 26.0–34.0)
MCHC: 32.8 g/dL (ref 30.0–36.0)
MCV: 81.5 fL (ref 80.0–100.0)
Metamyelocytes Relative: 5 %
Monocytes Absolute: 0.6 10*3/uL (ref 0.1–1.0)
Monocytes Relative: 5 %
Myelocytes: 2 %
Neutro Abs: 8.6 10*3/uL — ABNORMAL HIGH (ref 1.7–7.7)
Neutrophils Relative %: 67 %
Platelets: 438 10*3/uL — ABNORMAL HIGH (ref 150–400)
RBC: 4.49 MIL/uL (ref 3.87–5.11)
RDW: 15.3 % (ref 11.5–15.5)
WBC: 12.7 10*3/uL — ABNORMAL HIGH (ref 4.0–10.5)
nRBC: 0 % (ref 0.0–0.2)

## 2022-10-16 LAB — GLUCOSE, CAPILLARY
Glucose-Capillary: 113 mg/dL — ABNORMAL HIGH (ref 70–99)
Glucose-Capillary: 131 mg/dL — ABNORMAL HIGH (ref 70–99)

## 2022-10-16 LAB — PROCALCITONIN: Procalcitonin: 0.44 ng/mL

## 2022-10-16 LAB — MAGNESIUM: Magnesium: 1.2 mg/dL — ABNORMAL LOW (ref 1.7–2.4)

## 2022-10-16 MED ORDER — OXYCODONE HCL 5 MG PO TABS: 5.0000 mg | ORAL_TABLET | Freq: Four times a day (QID) | ORAL | 0 refills | Status: AC | PRN

## 2022-10-16 MED ORDER — POTASSIUM CHLORIDE CRYS ER 20 MEQ PO TBCR
20.0000 meq | EXTENDED_RELEASE_TABLET | Freq: Once | ORAL | Status: AC
  Administered 2022-10-16: 20 meq via ORAL
  Filled 2022-10-16: qty 1

## 2022-10-16 MED ORDER — MAGNESIUM SULFATE 2 GM/50ML IV SOLN
2.0000 g | Freq: Once | INTRAVENOUS | Status: AC
  Administered 2022-10-16: 2 g via INTRAVENOUS
  Filled 2022-10-16: qty 50

## 2022-10-16 MED ORDER — CEFDINIR 300 MG PO CAPS: 300.0000 mg | ORAL_CAPSULE | Freq: Two times a day (BID) | ORAL | Status: DC

## 2022-10-16 MED ORDER — CEFDINIR 300 MG PO CAPS: 300.0000 mg | ORAL_CAPSULE | Freq: Two times a day (BID) | ORAL | 0 refills | Status: AC

## 2022-10-16 MED ORDER — METRONIDAZOLE 500 MG PO TABS: 500.0000 mg | ORAL_TABLET | Freq: Two times a day (BID) | ORAL | 0 refills | Status: AC

## 2022-10-16 NOTE — Progress Notes (Signed)
[facility]  Eating and having Bms. Feeling ok. No major complaints. Wants more food. JP drain with serous output. Following clinically now.   BP (!) 147/81 (BP Location: Left Arm)   Pulse 91   Temp 98.1 F (36.7 C) (Oral)   Resp 20   Ht 5\' 3"  (1.6 m)   Wt 84.8 kg   SpO2 97%   BMI 33.13 kg/m  JP with serous output Staples c/d/I without erythema or drainage  Patient s/p lap appy for perforated appendicitis. Doing well now. JP with serous output. No fevers. WBC down. Getting treated for possible PNA.   Will see Tuesday Discussed reasons to call if JP drain output changes Soft diet now    Algis Greenhouse, MD [facility] [address] [place], [place] [address] [phone] (office)

## 2022-10-16 NOTE — Progress Notes (Signed)
Nsg Discharge Note  Admit Date:  10/10/2022 Discharge date: 10/16/2022   [name] to be D/C'd Home per MD order.  AVS completed.  Patient/caregiver able to verbalize understanding.  Discharge Medication: Allergies as of 10/16/2022       Reactions   Codeine Hives, Nausea And Vomiting   Morphine And Related Nausea Only   Penicillins Hives   Did it involve swelling of the face/tongue/throat, SOB, or low BP? No Did it involve sudden or severe rash/hives, skin peeling, or any reaction on the inside of your mouth or nose? No Did you need to seek medical attention at a hospital or doctor's office? No When did it last happen?      10+ years If all above answers are "NO", may proceed with cephalosporin use.        Medication List     STOP taking these medications    ibuprofen 800 MG tablet Commonly known as: ADVIL       TAKE these medications    acetaminophen 500 MG tablet Commonly known as: TYLENOL Take 1,000 mg by mouth 2 (two) times daily as needed for moderate pain or headache.   albuterol 108 (90 Base) MCG/ACT inhaler Commonly known as: VENTOLIN HFA Inhale 2 puffs into the lungs every 4 (four) hours as needed for wheezing or shortness of breath.   atorvastatin 20 MG tablet Commonly known as: LIPITOR Take 1 tablet (20 mg total) by mouth daily.   cefdinir 300 MG capsule Commonly known as: OMNICEF Take 1 capsule (300 mg total) by mouth every 12 (twelve) hours.   cromolyn 4 % ophthalmic solution Commonly known as: OPTICROM 1 drop 2 (two) times daily.   cyclobenzaprine 10 MG tablet Commonly known as: FLEXERIL Take 10 mg by mouth daily.   fenofibrate 145 MG tablet Commonly known as: TRICOR Take 145 mg by mouth daily.   fluticasone 50 MCG/ACT nasal spray Commonly known as: FLONASE Place 1 spray into both nostrils 2 (two) times daily.   gabapentin 300 MG capsule Commonly known as: NEURONTIN Take 300 mg by mouth 3 (three) times daily.   hydrOXYzine 25 MG  capsule Commonly known as: VISTARIL Take 25 mg by mouth 2 (two) times daily as needed.   loratadine 10 MG tablet Commonly known as: CLARITIN Take 10 mg by mouth daily as needed for allergies.   metroNIDAZOLE 500 MG tablet Commonly known as: FLAGYL Take 1 tablet (500 mg total) by mouth 2 (two) times daily.   montelukast 10 MG tablet Commonly known as: SINGULAIR Take 10 mg by mouth daily.   omeprazole 40 MG capsule Commonly known as: PRILOSEC Take 1 capsule by mouth every morning.   oxyCODONE 5 MG immediate release tablet Commonly known as: Oxy IR/ROXICODONE Take 1 tablet (5 mg total) by mouth every 6 (six) hours as needed for moderate pain.   pramipexole 1.5 MG tablet Commonly known as: MIRAPEX Take 1 tablet (1.5 mg total) by mouth at bedtime.   propranolol 20 MG tablet Commonly known as: INDERAL Take 20 mg by mouth 2 (two) times daily. For tremors   Restasis 0.05 % ophthalmic emulsion Generic drug: cycloSPORINE Place 1 drop into both eyes 2 (two) times daily.   risperiDONE 0.5 MG tablet Commonly known as: RISPERDAL Take 0.5 mg by mouth 2 (two) times daily.   spironolactone 50 MG tablet Commonly known as: ALDACTONE Take 50 mg by mouth daily.   Symbicort 160-4.5 MCG/ACT inhaler Generic drug: budesonide-formoterol Inhale into the lungs.   tamoxifen 10  MG tablet Commonly known as: NOLVADEX Take 1 tablet by mouth once daily What changed:  how much to take how to take this when to take this additional instructions   venlafaxine XR 150 MG 24 hr capsule Commonly known as: EFFEXOR-XR Take 150 mg by mouth every evening.        Discharge Assessment: Vitals:   10/16/22 0526 10/16/22 0724  BP: (!) 147/81   Pulse: 91   Resp: 20   Temp: 98.1 F (36.7 C)   SpO2: 96% 97%   Skin clean, dry and intact without evidence of skin break down, no evidence of skin tears noted. IV catheter discontinued intact. Site without signs and symptoms of complications - no  redness or edema noted at insertion site, patient denies c/o pain - only slight tenderness at site.  Dressing with slight pressure applied.  D/c Instructions-Education: Discharge instructions given to patient/family with verbalized understanding. D/c education completed with patient/family including follow up instructions, medication list, d/c activities limitations if indicated, with other d/c instructions as indicated by MD - patient able to verbalize understanding, all questions fully answered. Patient instructed to return to ED, call 911, or call MD for any changes in condition.  Patient escorted via WC, and D/C home via private auto.  Laurena Spies, RN 10/16/2022 12:13 PM

## 2022-10-16 NOTE — Discharge Summary (Signed)
Physician Discharge Summary   Patient: Elizabeth Bullock MRN: [id number] DOB: Jan 08, 1966  Admit date:     10/10/2022  Discharge date: 10/16/22  Discharge Physician: Onalee Hua [place] Doster   PCP: Remus Loffler, PA-C   Recommendations at discharge:   Please follow up with primary care provider within 1-2 weeks  Please repeat BMP and CBC in one week     Hospital Course: 57 y.o. female with past medical history of hypertension, COPD, hyperlipidemia, GERD, depression, anxiety, bipolar disorder, history of breast cancer admitted on 10/10/2022 with abdominal pain and intractable emesis and found to have perforated appendicitis and AKI. Underwent laparoscopic appendectomy 10/10/2022. Given appendiceal rupture, and pus in the abdomen, a JP drain was placed at the time of surgery. [name] is now recovering postoperatively with postoperative course complicated by ileus requiring NG tube drainage.   She also had new intra-abdominal fluid collections concerning for abscess on CT 10/14/2022.  The CT also showed adynamic ileus..  She was continued to IV abx.  She improved clinically with decreased abdominal pain.  She began having bowel movements.  Diet was gradually advanced which she tolerated. WBC continue to improve.  She remained afebrile hemodynamically stable.  Repeat CT suggested right lower lobe consolidation, but PCT was only 0.44.  Nevertheless, her antibiotics were switched to cefepime and metronidazole.  She remained stable on room air.  She will be discharged home with 5 additional days of cefdinir and metronidazole.  She will follow-up in the office with Dr. Henreitta Leber on 10/21/2022.  Assessment and Plan:      Acute perforated appendicitis, with postop ileus -Status post  lap appendectomy 10/10/22, JP drain in place -Education on JP drain management was provided to the patient -NG tube removed 4/15 -Cipro/Flagyl --> Cefepime/Flagyl on 4/16 -appreciate general surgery follow up -restarted on clears  4/17 -Advance to soft diet 10/16/22 which the patient tolerated. -Patient will be discharged home with cefdinir and metronidazole x 5 additional days   HCAP -10/14/22 CT abd--6.3x2.6x2.7 cm rim enhancing fluid collection in R-hemipelvis;  stomach and prox SB distended without discrete transition point; progressive RLL consolidation; +hepatomegaly  -Changed antibiotics to cefepime 4/16 -check PCT 0.44 -Patient will be discharged home with cefdinir and metronidazole x 5 additional days   Hyperlipidemia -Lipitor, fenofibrate   Essential hypertension -Spironolactone on hold due to hypotensive episode -BP stable now and rising again -Restart antihypertensive medications after discharge   Hypomagnesemia -replete IV   Mood disorder -Continue Risperdal, Effexor   COPD -Stable on RA -continue Dulera   History of breast cancer -Continue Tamoxifen   AKI -baseline creatinine 0.8-1.0 -serum creatinine peaked 3.96 -improved -Serum creatinine 0.73 on day of discharge   Obesity -BMI 33.13 -lifestyle modfication  Hypokalemia/hypomagnesemia -Repleted   Consultants: General surgery Procedures performed: Laparoscopic appendectomy 10/10/2022 Disposition: Home Diet recommendation:  Cardiac diet DISCHARGE MEDICATION: Allergies as of 10/16/2022       Reactions   Codeine Hives, Nausea And Vomiting   Morphine And Related Nausea Only   Penicillins Hives   Did it involve swelling of the face/tongue/throat, SOB, or low BP? No Did it involve sudden or severe rash/hives, skin peeling, or any reaction on the inside of your mouth or nose? No Did you need to seek medical attention at a hospital or doctor's office? No When did it last happen?      10+ years If all above answers are "NO", may proceed with cephalosporin use.        Medication List  STOP taking these medications    ibuprofen 800 MG tablet Commonly known as: ADVIL       TAKE these medications    acetaminophen  500 MG tablet Commonly known as: TYLENOL Take 1,000 mg by mouth 2 (two) times daily as needed for moderate pain or headache.   albuterol 108 (90 Base) MCG/ACT inhaler Commonly known as: VENTOLIN HFA Inhale 2 puffs into the lungs every 4 (four) hours as needed for wheezing or shortness of breath.   atorvastatin 20 MG tablet Commonly known as: LIPITOR Take 1 tablet (20 mg total) by mouth daily.   cefdinir 300 MG capsule Commonly known as: OMNICEF Take 1 capsule (300 mg total) by mouth every 12 (twelve) hours.   cromolyn 4 % ophthalmic solution Commonly known as: OPTICROM 1 drop 2 (two) times daily.   cyclobenzaprine 10 MG tablet Commonly known as: FLEXERIL Take 10 mg by mouth daily.   fenofibrate 145 MG tablet Commonly known as: TRICOR Take 145 mg by mouth daily.   fluticasone 50 MCG/ACT nasal spray Commonly known as: FLONASE Place 1 spray into both nostrils 2 (two) times daily.   gabapentin 300 MG capsule Commonly known as: NEURONTIN Take 300 mg by mouth 3 (three) times daily.   hydrOXYzine 25 MG capsule Commonly known as: VISTARIL Take 25 mg by mouth 2 (two) times daily as needed.   loratadine 10 MG tablet Commonly known as: CLARITIN Take 10 mg by mouth daily as needed for allergies.   metroNIDAZOLE 500 MG tablet Commonly known as: FLAGYL Take 1 tablet (500 mg total) by mouth 2 (two) times daily.   montelukast 10 MG tablet Commonly known as: SINGULAIR Take 10 mg by mouth daily.   omeprazole 40 MG capsule Commonly known as: PRILOSEC Take 1 capsule by mouth every morning.   oxyCODONE 5 MG immediate release tablet Commonly known as: Oxy IR/ROXICODONE Take 1 tablet (5 mg total) by mouth every 6 (six) hours as needed for moderate pain.   pramipexole 1.5 MG tablet Commonly known as: MIRAPEX Take 1 tablet (1.5 mg total) by mouth at bedtime.   propranolol 20 MG tablet Commonly known as: INDERAL Take 20 mg by mouth 2 (two) times daily. For tremors   Restasis  0.05 % ophthalmic emulsion Generic drug: cycloSPORINE Place 1 drop into both eyes 2 (two) times daily.   risperiDONE 0.5 MG tablet Commonly known as: RISPERDAL Take 0.5 mg by mouth 2 (two) times daily.   spironolactone 50 MG tablet Commonly known as: ALDACTONE Take 50 mg by mouth daily.   Symbicort 160-4.5 MCG/ACT inhaler Generic drug: budesonide-formoterol Inhale into the lungs.   tamoxifen 10 MG tablet Commonly known as: NOLVADEX Take 1 tablet by mouth once daily What changed:  how much to take how to take this when to take this additional instructions   venlafaxine XR 150 MG 24 hr capsule Commonly known as: EFFEXOR-XR Take 150 mg by mouth every evening.        Follow-up Information     Lucretia Roers, MD Follow up on 10/21/2022.   Specialty: General Surgery Why: staple removal Contact information: [address] [place] [facility] [place] [address] [phone]                Discharge Exam: Filed Weights   10/10/22 1223  Weight: 84.8 kg   HEENT:  [place]/AT, No thrush, no icterus CV:  RRR, no rub, no S3, no S4 Lung:  CTA, no wheeze, no rhonchi Abd:  soft/+BS, NT Ext:  No  edema, no lymphangitis, no synovitis, no rash   Condition at discharge: stable  The results of significant diagnostics from this hospitalization (including imaging, microbiology, ancillary and laboratory) are listed below for reference.   Imaging Studies: DG Abd 1 View  Result Date: 10/15/2022 CLINICAL DATA:  Postoperative ileus EXAM: ABDOMEN - 1 VIEW COMPARISON:  KUB 1 day prior FINDINGS: Gaseous distention of the small bowel measuring up to 5.0 cm is stable to slightly improved since the study from 1 day prior. The colon is normal in caliber. There is no definite free intraperitoneal air, within the confines of supine technique. Right upper quadrant surgical clips and a surgical drain are again noted. The imaged lung bases are clear. IMPRESSION: Gaseous distention of the small bowel  appears stable to slightly improved since the study from 1 day prior. Electronically Signed   By: Lesia Hausen M.D.   On: 10/15/2022 08:51   DG Abd 1 View  Result Date: 10/14/2022 CLINICAL DATA:  [id number] Ileus [id number] EXAM: ABDOMEN - 1 VIEW COMPARISON:  CT 10/14/2022 FINDINGS: There is small bowel dilation with normal caliber colon and stone. Surgical drain overlies the right hemiabdomen. No radiopaque calculi overlie the kidneys. There is retained contrast material in the bladder. IMPRESSION: Small bowel dilation likely reflecting postoperative ileus. Recommend continued radiographic follow-up. Electronically Signed   By: Caprice Renshaw M.D.   On: 10/14/2022 14:41   CT ABDOMEN PELVIS W CONTRAST  Result Date: 10/14/2022 CLINICAL DATA:  Abdominal pain, post-op Perforated appendicitis; staple line right at perforation; oversewn/ patched; assess for any leakage of oral contrast EXAM: CT ABDOMEN AND PELVIS WITH CONTRAST TECHNIQUE: Multidetector CT imaging of the abdomen and pelvis was performed using the standard protocol following bolus administration of intravenous contrast. RADIATION DOSE REDUCTION: This exam was performed according to the departmental dose-optimization program which includes automated exposure control, adjustment of the mA and/or kV according to patient size and/or use of iterative reconstruction technique. CONTRAST:  OMNIPAQUE IOHEXOL 300 MG/ML  SOLN COMPARISON:  10/10/2022 FINDINGS: Lower chest: Progressive right lower lobe airspace consolidation. Mild left basilar atelectasis. Heart size is normal. Hepatobiliary: Liver measures 22 cm in length. No focal liver abnormality is seen. Status post cholecystectomy. No biliary dilatation. Pancreas: Unremarkable. No pancreatic ductal dilatation or surrounding inflammatory changes. Spleen: Normal in size without focal abnormality. Adrenals/Urinary Tract: Unremarkable adrenal glands. Kidneys enhance symmetrically without focal lesion, stone, or  hydronephrosis. Ureters are nondilated. Urinary bladder appears unremarkable for the degree of distention. Stomach/Bowel: Interval postsurgical changes following recent laparoscopic appendectomy. Rim enhancing fluid collection in the right hemipelvis overlying the right iliac vessels measuring 6.3 x 2.6 x 2.7 cm (series 7, image 59; series 2, image 71). Small volume simple-appearing free fluid in the right pericolic gutter. A surgical drain is in place within the right lower quadrant extending into the mid right abdomen. The stomach and proximal small bowel are distended with intermediate density fluid, likely oral contrast. Small bowel loops measure up to 4.8 cm in diameter. Multiple air-fluid levels. No abrupt transition point. Distal small bowel within the pelvis is decompressed and appears mildly thick walled. No focal colonic wall thickening. Vascular/Lymphatic: Aortic atherosclerosis. No enlarged abdominal or pelvic lymph nodes. Reproductive: Status post hysterectomy. No adnexal masses. Other: No extraluminal contrast is seen, although contrast has not reached the distal small bowel or colon. No free intraperitoneal air. Laparoscopic port sites are noted at the umbilicus and left lower quadrant. Musculoskeletal: No new or acute bony findings. IMPRESSION: 1. Interval postsurgical  changes following recent laparoscopic appendectomy. Rim-enhancing fluid collection in the right hemipelvis measuring 6.3 x 2.6 x 2.7 cm, most compatible with abscess. 2. The stomach and proximal small bowel are distended with intermediate density fluid, likely oral contrast. No abrupt transition point. Distal small bowel within the pelvis is decompressed and appears mildly thick walled. Findings are favored to represent adynamic ileus and reactive enteritis. 3. No evidence of extraluminal contrast leakage, although contrast has not reached the distal small bowel or colon. Continued follow-up recommended. 4. Progressive right lower lobe  airspace consolidation, concerning for pneumonia. 5. Hepatomegaly. 6. Aortic atherosclerosis (ICD10-I70.0). Electronically Signed   By: Duanne Guess D.O.   On: 10/14/2022 12:47   DG Chest Port 1 View  Result Date: 10/11/2022 CLINICAL DATA:  [id number] Encounter for nasogastric (NG) tube placement [id number] EXAM: PORTABLE CHEST 1 VIEW COMPARISON:  Chest x-ray 10/15/2015 FINDINGS: Enteric tube partially visualized overlying the neck with tip at the level of the C7 vertebra. Side port overlies the oropharynx. The heart and mediastinal contours are within normal limits. Low lung volumes. No focal consolidation. No pulmonary edema. No pleural effusion. No pneumothorax. No acute osseous abnormality. IMPRESSION: 1. Enteric tube partially visualized overlying the neck with tip at the level of the C7 vertebra. Enteric tube likely coiled more proximally within the nasopharynx. 2.   Low lung volumes. Electronically Signed   By: Tish Frederickson M.D.   On: 10/11/2022 20:11   DG Abd 1 View  Result Date: 10/11/2022 CLINICAL DATA:  NG tube placement EXAM: ABDOMEN - 1 VIEW COMPARISON:  10/11/2022 at 0041 hours FINDINGS: Enteric tube in the gastric cardia with its side port at the GE junction. Bilateral lower lobe scarring/atelectasis. Cholecystectomy clips. IMPRESSION: Enteric tube in the gastric cardia with its side port at the GE junction. Electronically Signed   By: Charline Bills M.D.   On: 10/11/2022 03:57   DG Abd 1 View  Result Date: 10/11/2022 CLINICAL DATA:  NG tube placement EXAM: ABDOMEN - 1 VIEW COMPARISON:  10/10/2022 FINDINGS: Enteric tube withdrawn, now terminating at the GE junction, with the side port in the distal esophagus. Advancement at least 10 cm is suggested. IMPRESSION: Enteric tube withdrawn, now terminating at the GE junction, with the side port in the distal esophagus. Advancement at least 10 cm is suggested. Electronically Signed   By: Charline Bills M.D.   On: 10/11/2022 01:02   DG Abd  1 View  Result Date: 10/10/2022 CLINICAL DATA:  NG tube placement EXAM: ABDOMEN - 1 VIEW COMPARISON:  CT abdomen/pelvis dated 10/10/2022 FINDINGS: Enteric tube terminates in the proximal gastric body. Dilated loops of small bowel in the central abdomen, favoring small bowel ileus on recent CT. Cholecystectomy clips. IMPRESSION: Enteric tube terminates in the proximal gastric body. Electronically Signed   By: Charline Bills M.D.   On: 10/10/2022 19:01   CT Renal Stone Study  Result Date: 10/10/2022 CLINICAL DATA:  Right lower quadrant/flank pain concerning for kidney stone EXAM: CT ABDOMEN AND PELVIS WITHOUT CONTRAST TECHNIQUE: Multidetector CT imaging of the abdomen and pelvis was performed following the standard protocol without IV contrast. RADIATION DOSE REDUCTION: This exam was performed according to the departmental dose-optimization program which includes automated exposure control, adjustment of the mA and/or kV according to patient size and/or use of iterative reconstruction technique. COMPARISON:  Remote prior CT scan of the abdomen and pelvis 05/28/2014 FINDINGS: Lower chest: Linear atelectasis versus scarring in the lung bases bilaterally. No acute abnormality. Atherosclerotic calcifications throughout the  coronary arteries. Normal size heart. No pleural effusion. Hepatobiliary: No focal liver abnormality is seen. Status post cholecystectomy. No biliary dilatation. Pancreas: Unremarkable. No pancreatic ductal dilatation or surrounding inflammatory changes. Spleen: Normal in size without focal abnormality. Adrenals/Urinary Tract: Adrenal glands are unremarkable. Kidneys are normal, without renal calculi, focal lesion, or hydronephrosis. Bladder is unremarkable. Stomach/Bowel: Diffuse mild distension of small bowel without definitive transition point suggestive of ileus. A dilated tubular structure is present extending from the cecum over the right pelvic sidewall measuring up to 1.2 cm in  diameter. Mild stranding in the adjacent fat. Findings are suggestive of acute appendicitis. No evidence of rupture or abscess given limitations of non contrasted imaging. Vascular/Lymphatic: Limited evaluation in the absence of intravenous contrast. Atherosclerotic calcifications present throughout the aorta. No aneurysm. Reproductive: Status post hysterectomy. No adnexal masses. Other: No abdominal wall hernia or abnormality. No abdominopelvic ascites. Musculoskeletal: Focal degenerative changes at L2-L3. Gas within the disc space suggests the absence of active discitis. IMPRESSION: 1. CT findings are most consistent with acute suppurative appendicitis although evaluation is limited in the absence of intravenous contrast. No evidence of free air or fluid collection to suggest perforation. 2. Secondary reactive small-bowel ileus. 3.  Aortic Atherosclerosis (ICD10-I70.0). 4. No evidence of nephrolithiasis or hydronephrosis. 5. Focal degenerative disc disease at L2-L3. 6. Bibasilar pleuroparenchymal pulmonary scarring. Electronically Signed   By: Malachy Moan M.D.   On: 10/10/2022 15:39    Microbiology: Results for orders placed or performed during the hospital encounter of 10/10/22  Urine Culture     Status: None   Collection Time: 10/10/22  5:19 PM   Specimen: Urine, Catheterized  Result Value Ref Range Status   Specimen Description   Final    URINE, CATHETERIZED Performed at [facility], [address], [place], [place] [address]    Special Requests   Final    NONE Performed at [facility] [address] [place], [place] [address]    Culture   Final    NO GROWTH Performed at [facility], [address], [place], [place] [address]    Report Status 10/12/2022 FINAL  Final    Labs: CBC: Recent Labs  Lab 10/10/22 1252 10/11/22 0402 10/13/22 0501 10/14/22 0411 10/15/22 0412 10/16/22 0416  WBC 14.2* 10.1 9.0 9.4 14.1* 12.7*  NEUTROABS 12.6*  --   --   --   --  8.6*  HGB  12.9 12.1 11.4* 11.4* 12.9 12.0  HCT 39.9 36.9 35.4* 34.3* 39.7 36.6  MCV 82.6 82.0 81.8 80.7 82.5 81.5  PLT 309 310 314 394 467* 438*   Basic Metabolic Panel: Recent Labs  Lab 10/12/22 0339 10/13/22 0501 10/14/22 0411 10/15/22 0412 10/16/22 0416  NA 135 137 135 136 133*  K 3.3* 3.6 3.7 4.0 3.4*  CL 107 112* 115* 109 110  CO2 18* 18* 18* 17* 16*  GLUCOSE 92 127* 119* 91 109*  BUN 28* 10 5* 5* 7  CREATININE 1.57* 0.92 0.80 0.85 0.73  CALCIUM 7.0* 7.3* 7.7* 8.3* 8.2*  MG 1.6* 1.2* 1.3* 1.5* 1.2*  PHOS 2.8  --   --   --   --    Liver Function Tests: Recent Labs  Lab 10/12/22 0339  ALBUMIN 2.3*   CBG: Recent Labs  Lab 10/15/22 0806 10/15/22 1140 10/15/22 1712 10/16/22 0730 10/16/22 1106  GLUCAP 113* 105* 91 113* 131*    Discharge time spent: greater than 30 minutes.  Signed: Catarina Hartshorn, MD [facility] 10/16/2022

## 2022-10-21 ENCOUNTER — Ambulatory Visit (INDEPENDENT_AMBULATORY_CARE_PROVIDER_SITE_OTHER): Payer: Medicaid Other | Admitting: General Surgery

## 2022-10-21 ENCOUNTER — Encounter: Payer: Self-pay | Admitting: General Surgery

## 2022-10-21 VITALS — BP 139/88 | HR 101 | Temp 98.1°F | Resp 14 | Ht 63.0 in | Wt 178.0 lb

## 2022-10-21 DIAGNOSIS — K3532 Acute appendicitis with perforation and localized peritonitis, without abscess: Secondary | ICD-10-CM

## 2022-10-21 NOTE — Patient Instructions (Addendum)
Steristrips will peel off in the next 5-7 days. You can remove them once they are peeling off. It is ok to shower. Pat the area dry.   Remove your dressing in 2 days.

## 2022-10-21 NOTE — Progress Notes (Signed)
[facility]  Feeling great. No fevers. JP with serous output 10cc of less a day.  BP 139/88   Pulse (!) 101   Temp 98.1 F (36.7 C) (Oral)   Resp 14   Ht 5\' 3"  (1.6 m)   Wt 178 lb (80.7 kg)   SpO2 97%   BMI 31.53 kg/m  Port sites with staples, c/di without erythema or drainage JP removed and steristrips placed after staple removal   Patient s/p laparoscopic appendectomy and fat patched down over staple line due to perforated appendicitis. JP placed. Doing well. Eating and having Bms. JP with serous fluid.  Steristrips will peel off in the next 5-7 days. You can remove them once they are peeling off. It is ok to shower. Pat the area dry.   Remove your dressing in 2 days.   Algis Greenhouse, MD [facility] [address] [place], [place] [address] [phone] (office)

## 2022-10-27 ENCOUNTER — Telehealth: Payer: Self-pay | Admitting: Family Medicine

## 2022-10-27 ENCOUNTER — Other Ambulatory Visit: Payer: Self-pay | Admitting: Family Medicine

## 2022-10-27 DIAGNOSIS — R1310 Dysphagia, unspecified: Secondary | ICD-10-CM

## 2022-10-27 NOTE — Telephone Encounter (Signed)
Patient called stating that she can not take her medication. She states that she can not get it to go down upon swallowing. No fever, nausea or vomiting otherwise.   Per Dr. Henreitta Leber - not sure but will need to get labs in case of an abscess.  Patient made aware and labs ordered.

## 2022-10-28 LAB — BASIC METABOLIC PANEL
BUN: 10 mg/dL (ref 7–25)
CO2: 22 mmol/L (ref 20–32)
Calcium: 9.7 mg/dL (ref 8.6–10.4)
Chloride: 103 mmol/L (ref 98–110)
Creat: 0.81 mg/dL (ref 0.50–1.03)
Glucose, Bld: 100 mg/dL — ABNORMAL HIGH (ref 65–99)
Potassium: 3.1 mmol/L — ABNORMAL LOW (ref 3.5–5.3)
Sodium: 137 mmol/L (ref 135–146)

## 2022-10-28 LAB — CBC WITH DIFFERENTIAL/PLATELET
Absolute Monocytes: 788 cells/uL (ref 200–950)
Basophils Absolute: 39 cells/uL (ref 0–200)
Basophils Relative: 0.5 %
Eosinophils Absolute: 172 cells/uL (ref 15–500)
Eosinophils Relative: 2.2 %
HCT: 41.6 % (ref 35.0–45.0)
Hemoglobin: 13.6 g/dL (ref 11.7–15.5)
Lymphs Abs: 2301 cells/uL (ref 850–3900)
MCH: 26.1 pg — ABNORMAL LOW (ref 27.0–33.0)
MCHC: 32.7 g/dL (ref 32.0–36.0)
MCV: 79.8 fL — ABNORMAL LOW (ref 80.0–100.0)
MPV: 10.3 fL (ref 7.5–12.5)
Monocytes Relative: 10.1 %
Neutro Abs: 4501 cells/uL (ref 1500–7800)
Neutrophils Relative %: 57.7 %
Platelets: 497 10*3/uL — ABNORMAL HIGH (ref 140–400)
RBC: 5.21 10*6/uL — ABNORMAL HIGH (ref 3.80–5.10)
RDW: 13.4 % (ref 11.0–15.0)
Total Lymphocyte: 29.5 %
WBC: 7.8 10*3/uL (ref 3.8–10.8)

## 2022-10-28 NOTE — Progress Notes (Signed)
Lab work reassuring. Normal wbc and no shift. Do not think we need to do another CT right now as this all looks normal. We could see her Thursday to check her. Maybe she has thrush? If she can send in nystatin swish and swallow

## 2022-10-30 ENCOUNTER — Encounter: Payer: Medicaid Other | Admitting: General Surgery

## 2023-02-23 ENCOUNTER — Other Ambulatory Visit: Payer: Self-pay | Admitting: Hematology and Oncology

## 2023-03-09 NOTE — Progress Notes (Signed)
Patient Care Team: Caryl Never as PCP - General (Physician Assistant) Ovidio Kin, MD as Consulting Physician (General Surgery) Serena Croissant, MD as Consulting Physician (Hematology and Oncology) Antony Blackbird, MD as Consulting Physician (Radiation Oncology)  DIAGNOSIS: No diagnosis found.  SUMMARY OF ONCOLOGIC HISTORY: Oncology History  Malignant neoplasm of upper-inner quadrant of left breast in female, estrogen receptor positive (HCC)  02/11/2019 Initial Diagnosis   Routine screening mammogram detected a 4mm mass with associated architectural distortion in the upper left breast, no axillary adenopathy. 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   02/16/2019 Cancer Staging   Staging form: Breast, AJCC 8th Edition - Clinical stage from 02/16/2019: Stage IA (cT1a, cN0, cM0, G2, ER+, PR+, HER2-) - Signed by Serena Croissant, MD on 02/16/2019   03/15/2019 Surgery   Left lumpectomy Ezz[facility] Standing): IDC with DCIS, 0.3cm, grade 1, clear margins. ER positive, PR positive, HER-2 negative. 4 left axillary lymph nodes negative.   03/15/2019 Cancer Staging   Staging form: Breast, AJCC 8th Edition - Pathologic stage from 03/15/2019: Stage IA (pT1a, pN0, cM0, G1, ER+, PR+, HER2-) - Signed by Loa Socks, NP on 07/18/2019   04/12/2019 - 05/10/2019 Radiation Therapy   The patient initially received a dose of 40.05 Gy in 15 fractions to the breast using whole-breast tangent fields. This was delivered using a 3-D conformal technique. The pt received a boost delivering an additional 12 Gy in 6 fractions using a electron boost with electrons. The total dose was 52.05 Gy.   05/2019 -  Anti-estrogen oral therapy   Anastrozole switched to tamoxifen     CHIEF COMPLIANT: Follow-up of left breast cancer   INTERVAL HISTORY: Elizabeth Bullock is a 57 y.o. with above-mentioned history of left breast cancer who underwent a lumpectomy and  radiation therapy, currently on anti-estrogen therapy with anastrozole. She presents to the clinic today for follow-up.     ALLERGIES:  is allergic to codeine, morphine and codeine, and penicillins.  MEDICATIONS:  Current Outpatient Medications  Medication Sig Dispense Refill   acetaminophen (TYLENOL) 500 MG tablet Take 1,000 mg by mouth 2 (two) times daily as needed for moderate pain or headache.     albuterol (VENTOLIN HFA) 108 (90 Base) MCG/ACT inhaler Inhale 2 puffs into the lungs every 4 (four) hours as needed for wheezing or shortness of breath.     atorvastatin (LIPITOR) 20 MG tablet Take 1 tablet (20 mg total) by mouth daily. 90 tablet 1   cefdinir (OMNICEF) 300 MG capsule Take 1 capsule (300 mg total) by mouth every 12 (twelve) hours. 10 capsule 0   cromolyn (OPTICROM) 4 % ophthalmic solution 1 drop 2 (two) times daily.     cyclobenzaprine (FLEXERIL) 10 MG tablet Take 10 mg by mouth daily.     fenofibrate (TRICOR) 145 MG tablet Take 145 mg by mouth daily.     fluticasone (FLONASE) 50 MCG/ACT nasal spray Place 1 spray into both nostrils 2 (two) times daily.     gabapentin (NEURONTIN) 300 MG capsule Take 300 mg by mouth 3 (three) times daily.     hydrOXYzine (VISTARIL) 25 MG capsule Take 25 mg by mouth 2 (two) times daily as needed.     loratadine (CLARITIN) 10 MG tablet Take 10 mg by mouth daily as needed for allergies.      metroNIDAZOLE (FLAGYL) 500 MG tablet Take 1 tablet (500 mg total) by mouth 2 (two) times daily.  10 tablet 0   montelukast (SINGULAIR) 10 MG tablet Take 10 mg by mouth daily.     omeprazole (PRILOSEC) 40 MG capsule Take 1 capsule by mouth every morning.     oxyCODONE (OXY IR/ROXICODONE) 5 MG immediate release tablet Take 1 tablet (5 mg total) by mouth every 6 (six) hours as needed for moderate pain. 10 tablet 0   pramipexole (MIRAPEX) 1.5 MG tablet Take 1 tablet (1.5 mg total) by mouth at bedtime.     propranolol (INDERAL) 20 MG tablet Take 20 mg by mouth 2 (two)  times daily. For tremors     RESTASIS 0.05 % ophthalmic emulsion Place 1 drop into both eyes 2 (two) times daily.     risperiDONE (RISPERDAL) 0.5 MG tablet Take 0.5 mg by mouth 2 (two) times daily.     spironolactone (ALDACTONE) 50 MG tablet Take 50 mg by mouth daily.     SYMBICORT 160-4.5 MCG/ACT inhaler Inhale into the lungs.     tamoxifen (NOLVADEX) 10 MG tablet TAKE 1 TABLET BY MOUTH ONCE DAILY 30 tablet 10   venlafaxine XR (EFFEXOR-XR) 150 MG 24 hr capsule Take 150 mg by mouth every evening.     No current facility-administered medications for this visit.    PHYSICAL EXAMINATION: ECOG PERFORMANCE STATUS: {CHL ONC ECOG PS:[id number]}  There were no vitals filed for this visit. There were no vitals filed for this visit.  BREAST:*** No palpable masses or nodules in either right or left breasts. No palpable axillary supraclavicular or infraclavicular adenopathy no breast tenderness or nipple discharge. (exam performed in the presence of a chaperone)  LABORATORY DATA:  I have reviewed the data as listed    Latest Ref Rng & Units 10/27/2022    3:54 PM 10/16/2022    4:16 AM 10/15/2022    4:12 AM  CMP  Glucose 65 - 99 mg/dL 161  096  91   BUN 7 - 25 mg/dL 10  7  5    Creatinine 0.50 - 1.03 mg/dL 0.45  4.09  8.11   Sodium 135 - 146 mmol/L 137  133  136   Potassium 3.5 - 5.3 mmol/L 3.1  3.4  4.0   Chloride 98 - 110 mmol/L 103  110  109   CO2 20 - 32 mmol/L 22  16  17    Calcium 8.6 - 10.4 mg/dL 9.7  8.2  8.3     Lab Results  Component Value Date   WBC 7.8 10/27/2022   HGB 13.6 10/27/2022   HCT 41.6 10/27/2022   MCV 79.8 (L) 10/27/2022   PLT 497 (H) 10/27/2022   NEUTROABS 4,501 10/27/2022    ASSESSMENT & PLAN:  No problem-specific Assessment & Plan notes found for this encounter.    No orders of the defined types were placed in this encounter.  The patient has a good understanding of the overall plan. she agrees with it. she will call with any problems that may develop  before the next visit here. Total time spent: 30 mins including face to face time and time spent for planning, charting and co-ordination of care   Sherlyn Lick, CMA 03/09/23    I Janan Ridge am acting as a Neurosurgeon for [employer]  ***

## 2023-03-12 ENCOUNTER — Inpatient Hospital Stay: Payer: Medicaid Other | Attending: Hematology and Oncology | Admitting: Hematology and Oncology

## 2023-03-12 VITALS — BP 111/77 | HR 88 | Temp 97.8°F | Resp 18 | Ht 63.0 in | Wt 192.7 lb

## 2023-03-12 DIAGNOSIS — C50412 Malignant neoplasm of upper-outer quadrant of left female breast: Secondary | ICD-10-CM | POA: Insufficient documentation

## 2023-03-12 DIAGNOSIS — R296 Repeated falls: Secondary | ICD-10-CM | POA: Insufficient documentation

## 2023-03-12 DIAGNOSIS — Z170 Estrogen receptor positive status [ER+]: Secondary | ICD-10-CM | POA: Insufficient documentation

## 2023-03-12 DIAGNOSIS — F17210 Nicotine dependence, cigarettes, uncomplicated: Secondary | ICD-10-CM | POA: Insufficient documentation

## 2023-03-12 DIAGNOSIS — Z79810 Long term (current) use of selective estrogen receptor modulators (SERMs): Secondary | ICD-10-CM | POA: Insufficient documentation

## 2023-03-12 DIAGNOSIS — C50212 Malignant neoplasm of upper-inner quadrant of left female breast: Secondary | ICD-10-CM

## 2023-03-12 NOTE — Assessment & Plan Note (Addendum)
03/15/2019:Left lumpectomy Ezzard Standing): IDC with DCIS, 0.3cm, grade 1, clear margins, and 4 left axillary lymph nodes negative.  ER 100%, PR 90%, Ki-67 10%, HER-2 negative, T1 a N0 stage Ia Adjuvant radiation 04/13/2019-05/10/2019 Anastrozole from 05/2019 through 11/2019--stopped due to significant arthralgias. Switched to letrozole 01/10/2020 discontinued 02/14/2020 switched to tamoxifen 03/06/2020 ______________________________________________________________________________________________   Tamoxifen toxicities: Started March 06, 2020.  No major side effects to tamoxifen. Next year we would like to send for breast cancer index or determine if she would benefit from external endocrine therapy.   She continues to smoke cigarettes.  She is not yet ready to quit. Frequent falls: Unclear etiology.  They have improved   Breast cancer surveillance: 1. Mammogram at [facility] 2. breast exam  03/11/2022: Benign   Return to clinic in 1 year for follow-up

## 2023-03-16 ENCOUNTER — Telehealth: Payer: Self-pay | Admitting: Hematology and Oncology

## 2023-03-16 NOTE — Telephone Encounter (Signed)
Unable to leave a message due to patient's voicemail box being full; mail out has been sent on 9/16

## 2023-10-01 ENCOUNTER — Other Ambulatory Visit (HOSPITAL_COMMUNITY): Payer: Self-pay | Admitting: Physician Assistant

## 2023-10-01 DIAGNOSIS — F17200 Nicotine dependence, unspecified, uncomplicated: Secondary | ICD-10-CM

## 2023-10-13 ENCOUNTER — Ambulatory Visit (HOSPITAL_COMMUNITY)
Admission: RE | Admit: 2023-10-13 | Discharge: 2023-10-13 | Disposition: A | Discharge status: Home / Self Care | Source: Ambulatory Visit | Attending: Physician Assistant | Admitting: Physician Assistant

## 2023-10-13 DIAGNOSIS — F17200 Nicotine dependence, unspecified, uncomplicated: Secondary | ICD-10-CM | POA: Diagnosis present

## 2023-10-28 ENCOUNTER — Other Ambulatory Visit (HOSPITAL_COMMUNITY): Payer: Self-pay | Admitting: Nephrology

## 2023-10-28 DIAGNOSIS — N1832 Chronic kidney disease, stage 3b: Secondary | ICD-10-CM

## 2024-02-19 ENCOUNTER — Encounter: Payer: Self-pay | Admitting: Radiology

## 2024-03-15 ENCOUNTER — Inpatient Hospital Stay: Payer: Medicaid Other | Attending: Hematology and Oncology | Admitting: Hematology and Oncology

## 2024-03-15 VITALS — BP 135/84 | HR 88 | Temp 97.8°F | Resp 16 | Wt 189.5 lb

## 2024-03-15 DIAGNOSIS — Z17411 Hormone receptor positive with human epidermal growth factor receptor 2 negative status: Secondary | ICD-10-CM | POA: Diagnosis not present

## 2024-03-15 DIAGNOSIS — Z170 Estrogen receptor positive status [ER+]: Secondary | ICD-10-CM

## 2024-03-15 DIAGNOSIS — Z79810 Long term (current) use of selective estrogen receptor modulators (SERMs): Secondary | ICD-10-CM | POA: Diagnosis not present

## 2024-03-15 DIAGNOSIS — C50212 Malignant neoplasm of upper-inner quadrant of left female breast: Secondary | ICD-10-CM | POA: Insufficient documentation

## 2024-03-15 MED ORDER — TAMOXIFEN CITRATE 10 MG PO TABS: ORAL_TABLET | ORAL | 3 refills | Status: AC

## 2024-03-15 NOTE — Progress Notes (Signed)
 Patient Care Team: Joshua Clayborne GORMAN DEVONNA as PCP - General (Physician Assistant) Ethyl Lenis, MD as Consulting Physician (General Surgery) Odean Potts, MD as Consulting Physician (Hematology and Oncology) Shannon Agent, MD as Consulting Physician (Radiation Oncology)  DIAGNOSIS:  Encounter Diagnosis  Name Primary?   Malignant neoplasm of upper-inner quadrant of left breast in female, estrogen receptor positive (HCC) Yes    SUMMARY OF ONCOLOGIC HISTORY: Oncology History  Malignant neoplasm of upper-inner quadrant of left breast in female, estrogen receptor positive (HCC)  02/11/2019 Initial Diagnosis   Routine screening mammogram detected a 4mm mass with associated architectural distortion in the upper left breast, no axillary adenopathy. 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   02/16/2019 Cancer Staging   Staging form: Breast, AJCC 8th Edition - Clinical stage from 02/16/2019: Stage IA (cT1a, cN0, cM0, G2, ER+, PR+, HER2-) - Signed by Odean Potts, MD on 02/16/2019   03/15/2019 Surgery   Left lumpectomy Jeoffrey): IDC with DCIS, 0.3cm, grade 1, clear margins. ER positive, PR positive, HER-2 negative. 4 left axillary lymph nodes negative.   03/15/2019 Cancer Staging   Staging form: Breast, AJCC 8th Edition - Pathologic stage from 03/15/2019: Stage IA (pT1a, pN0, cM0, G1, ER+, PR+, HER2-) - Signed by Crawford Morna Pickle, NP on 07/18/2019   04/12/2019 - 05/10/2019 Radiation Therapy   The patient initially received a dose of 40.05 Gy in 15 fractions to the breast using whole-breast tangent fields. This was delivered using a 3-D conformal technique. The pt received a boost delivering an additional 12 Gy in 6 fractions using a electron boost with electrons. The total dose was 52.05 Gy.   05/2019 -  Anti-estrogen oral therapy   Anastrozole  switched to tamoxifen      CHIEF COMPLIANT: Follow-up on tamoxifen   therapy  HISTORY OF PRESENT ILLNESS: History of Present Illness Ankita Newcomer is a 58 year old female who presents for a follow-up regarding antiestrogen therapy and smoking cessation.  She has been on hormone replacement therapy for four years and wishes to discontinue it due to concerns about smoking while on the medication. She smokes a pack of cigarettes a day and is attempting to reduce her smoking. Her father quit smoking after being diagnosed with lung cancer. A recent CT scan of her lungs was normal, but it revealed fatty liver changes. She does not consume alcohol and attributes the fatty liver to a lack of exercise.     ALLERGIES:  is allergic to codeine, morphine  and codeine, and penicillins.  MEDICATIONS:  Current Outpatient Medications  Medication Sig Dispense Refill   acetaminophen  (TYLENOL ) 500 MG tablet Take 1,000 mg by mouth 2 (two) times daily as needed for moderate pain or headache.     albuterol  (VENTOLIN  HFA) 108 (90 Base) MCG/ACT inhaler Inhale 2 puffs into the lungs every 4 (four) hours as needed for wheezing or shortness of breath.     atorvastatin  (LIPITOR) 20 MG tablet Take 1 tablet (20 mg total) by mouth daily. 90 tablet 1   cefdinir  (OMNICEF ) 300 MG capsule Take 1 capsule (300 mg total) by mouth every 12 (twelve) hours. 10 capsule 0   cromolyn  (OPTICROM ) 4 % ophthalmic solution 1 drop 2 (two) times daily.     cyclobenzaprine  (FLEXERIL ) 10 MG tablet Take 10 mg by mouth daily.     fenofibrate  (TRICOR ) 145 MG tablet Take 145 mg by mouth daily.     fluticasone (FLONASE) 50 MCG/ACT  nasal spray Place 1 spray into both nostrils 2 (two) times daily.     gabapentin  (NEURONTIN ) 300 MG capsule Take 300 mg by mouth 3 (three) times daily.     hydrOXYzine (VISTARIL) 25 MG capsule Take 25 mg by mouth 2 (two) times daily as needed.     loratadine (CLARITIN) 10 MG tablet Take 10 mg by mouth daily as needed for allergies.      metroNIDAZOLE  (FLAGYL ) 500 MG tablet Take 1 tablet  (500 mg total) by mouth 2 (two) times daily. 10 tablet 0   montelukast  (SINGULAIR ) 10 MG tablet Take 10 mg by mouth daily.     omeprazole  (PRILOSEC) 40 MG capsule Take 1 capsule by mouth every morning.     oxyCODONE  (OXY IR/ROXICODONE ) 5 MG immediate release tablet Take 1 tablet (5 mg total) by mouth every 6 (six) hours as needed for moderate pain. 10 tablet 0   pramipexole  (MIRAPEX ) 1.5 MG tablet Take 1 tablet (1.5 mg total) by mouth at bedtime.     propranolol (INDERAL) 20 MG tablet Take 20 mg by mouth 2 (two) times daily. For tremors     RESTASIS  0.05 % ophthalmic emulsion Place 1 drop into both eyes 2 (two) times daily.     risperiDONE  (RISPERDAL ) 0.5 MG tablet Take 0.5 mg by mouth 2 (two) times daily.     spironolactone (ALDACTONE) 50 MG tablet Take 50 mg by mouth daily.     SYMBICORT 160-4.5 MCG/ACT inhaler Inhale into the lungs.     venlafaxine  XR (EFFEXOR -XR) 150 MG 24 hr capsule Take 150 mg by mouth every evening.     tamoxifen  (NOLVADEX ) 10 MG tablet Take 1 tablet by mouth once daily 90 tablet 3   No current facility-administered medications for this visit.    PHYSICAL EXAMINATION: ECOG PERFORMANCE STATUS: 1 - Symptomatic but completely ambulatory  Vitals:   03/15/24 1048  BP: 135/84  Pulse: 88  Resp: 16  Temp: 97.8 F (36.6 C)  SpO2: 99%   Filed Weights   03/15/24 1048  Weight: 189 lb 8 oz (86 kg)    LABORATORY DATA:  I have reviewed the data as listed    Latest Ref Rng & Units 10/27/2022    3:54 PM 10/16/2022    4:16 AM 10/15/2022    4:12 AM  CMP  Glucose 65 - 99 mg/dL 899  890  91   BUN 7 - 25 mg/dL 10  7  5    Creatinine 0.50 - 1.03 mg/dL 9.18  9.26  9.14   Sodium 135 - 146 mmol/L 137  133  136   Potassium 3.5 - 5.3 mmol/L 3.1  3.4  4.0   Chloride 98 - 110 mmol/L 103  110  109   CO2 20 - 32 mmol/L 22  16  17    Calcium  8.6 - 10.4 mg/dL 9.7  8.2  8.3     Lab Results  Component Value Date   WBC 7.8 10/27/2022   HGB 13.6 10/27/2022   HCT 41.6 10/27/2022    MCV 79.8 (L) 10/27/2022   PLT 497 (H) 10/27/2022   NEUTROABS 4,501 10/27/2022    ASSESSMENT & PLAN:  Malignant neoplasm of upper-inner quadrant of left breast in female, estrogen receptor positive (HCC) 03/15/2019:Left lumpectomy [facility]rey): IDC with DCIS, 0.3cm, grade 1, clear margins, and 4 left axillary lymph nodes negative.  ER 100%, PR 90%, Ki-67 10%, HER-2 negative, T1 a N0 stage Ia Adjuvant radiation 04/13/2019-05/10/2019 Anastrozole  from 05/2019 through 11/2019--stopped due to  significant arthralgias. Switched to letrozole  01/10/2020 discontinued 02/14/2020 switched to tamoxifen  03/06/2020 ______________________________________________________________________________________________   Tamoxifen  toxicities: Started March 06, 2020.  No major side effects to tamoxifen . Next year we would like to send for breast cancer index or determine if she would benefit from external endocrine therapy.   She continues to smoke cigarettes.  She is not yet ready to quit. Frequent falls: Unclear etiology.  They have improved   Breast cancer surveillance: 1. Mammogram at [facility]  Scheduled for October 2025  Lung cancer screening low-dose CT: Negative, diffuse fatty liver Fatty liver: Discussed with her the importance of exercise   Return to clinic in 1 year for follow-up  Assessment & Plan Estrogen receptor-positive breast cancer On tamoxifen , wishes to discontinue due to smoking concerns. Advised continuation for optimal outcomes. - Continue tamoxifen  10 MG oral once daily for one more year. - Change prescription to a three-month supply. - Ensure refills available for another year.  Tobacco use disorder Smoking one pack per day, attempting reduction. Discussed increased risk with hormone therapy. - Encouraged continued efforts to quit smoking.  Nonalcoholic fatty liver disease Fatty liver on CT, likely due to inactivity. Emphasized exercise to manage condition and prevent progression. -  Encourage regular physical activity, starting with 15 minutes of walking daily, increasing to 30 minutes. - Incorporate stretching exercises before and after walking.  Chronic low back pain Back pain limits walking, causes weakness. Advised stretching to alleviate pain and improve mobility. - Incorporate stretching exercises before and after walking.      No orders of the defined types were placed in this encounter.  The patient has a good understanding of the overall plan. she agrees with it. she will call with any problems that may develop before the next visit here. Total time spent: 30 mins including face to face time and time spent for planning, charting and co-ordination of care   Naomi MARLA Chad, MD 03/15/24

## 2024-03-15 NOTE — Assessment & Plan Note (Signed)
 03/15/2019:Left lumpectomy Jeoffrey): IDC with DCIS, 0.3cm, grade 1, clear margins, and 4 left axillary lymph nodes negative.  ER 100%, PR 90%, Ki-67 10%, HER-2 negative, T1 a N0 stage Ia Adjuvant radiation 04/13/2019-05/10/2019 Anastrozole  from 05/2019 through 11/2019--stopped due to significant arthralgias. Switched to letrozole  01/10/2020 discontinued 02/14/2020 switched to tamoxifen  03/06/2020 ______________________________________________________________________________________________   Tamoxifen  toxicities: Started March 06, 2020.  No major side effects to tamoxifen . Next year we would like to send for breast cancer index or determine if she would benefit from external endocrine therapy.   She continues to smoke cigarettes.  She is not yet ready to quit. Frequent falls: Unclear etiology.  They have improved   Breast cancer surveillance: 1. Mammogram at [facility]. 2. breast exam 03/15/2024: Benign Lung cancer screening low-dose CT: Negative, diffuse fatty liver   Return to clinic in 1 year for follow-up

## 2024-04-12 ENCOUNTER — Encounter: Payer: Self-pay | Admitting: Hematology and Oncology

## 2024-05-02 ENCOUNTER — Encounter: Payer: Self-pay | Admitting: Radiology

## 2025-03-15 ENCOUNTER — Ambulatory Visit: Admitting: Hematology and Oncology
